# Patient Record
Sex: Female | Born: 1965
Health system: Southern US, Community
[De-identification: ages and names within clinical notes are randomized; demographics above are authoritative.]

## PROBLEM LIST (undated history)

## (undated) DIAGNOSIS — G43909 Migraine, unspecified, not intractable, without status migrainosus: Secondary | ICD-10-CM

## (undated) DIAGNOSIS — K219 Gastro-esophageal reflux disease without esophagitis: Secondary | ICD-10-CM

## (undated) DIAGNOSIS — E119 Type 2 diabetes mellitus without complications: Secondary | ICD-10-CM

## (undated) DIAGNOSIS — E7800 Pure hypercholesterolemia, unspecified: Secondary | ICD-10-CM

## (undated) DIAGNOSIS — I10 Essential (primary) hypertension: Secondary | ICD-10-CM

## (undated) HISTORY — DX: Type 2 diabetes mellitus without complications: E11.9

## (undated) HISTORY — DX: Gastro-esophageal reflux disease without esophagitis: K21.9

## (undated) HISTORY — DX: Essential (primary) hypertension: I10

## (undated) HISTORY — DX: Pure hypercholesterolemia, unspecified: E78.00

## (undated) HISTORY — PX: LASER ABLATION OF THE CERVIX: SHX1949

## (undated) HISTORY — DX: Migraine, unspecified, not intractable, without status migrainosus: G43.909

---

## 1997-08-30 ENCOUNTER — Other Ambulatory Visit: Admission: RE | Admit: 1997-08-30 | Discharge: 1997-08-30 | Payer: Self-pay | Admitting: *Deleted

## 1998-03-05 ENCOUNTER — Other Ambulatory Visit: Admission: RE | Admit: 1998-03-05 | Discharge: 1998-03-05 | Payer: Self-pay | Admitting: *Deleted

## 1998-03-06 ENCOUNTER — Other Ambulatory Visit: Admission: RE | Admit: 1998-03-06 | Discharge: 1998-03-06 | Payer: Self-pay | Admitting: *Deleted

## 2000-11-03 ENCOUNTER — Other Ambulatory Visit: Admission: RE | Admit: 2000-11-03 | Discharge: 2000-11-03 | Payer: Self-pay | Admitting: *Deleted

## 2006-04-26 HISTORY — PX: ABDOMINAL HYSTERECTOMY: SHX81

## 2006-04-26 HISTORY — PX: TOTAL ABDOMINAL HYSTERECTOMY: SHX209

## 2006-05-23 ENCOUNTER — Other Ambulatory Visit: Admission: RE | Admit: 2006-05-23 | Discharge: 2006-05-23 | Payer: Self-pay | Admitting: Gynecology

## 2006-09-12 ENCOUNTER — Encounter: Payer: Self-pay | Admitting: Gynecology

## 2006-09-12 ENCOUNTER — Inpatient Hospital Stay (HOSPITAL_COMMUNITY): Admission: RE | Admit: 2006-09-12 | Discharge: 2006-09-14 | Payer: Self-pay | Admitting: Gynecology

## 2007-10-13 ENCOUNTER — Other Ambulatory Visit: Admission: RE | Admit: 2007-10-13 | Discharge: 2007-10-13 | Payer: Self-pay | Admitting: Gynecology

## 2008-08-08 ENCOUNTER — Ambulatory Visit: Payer: Self-pay | Admitting: Family Medicine

## 2008-08-08 DIAGNOSIS — I10 Essential (primary) hypertension: Secondary | ICD-10-CM | POA: Insufficient documentation

## 2008-08-08 DIAGNOSIS — G43009 Migraine without aura, not intractable, without status migrainosus: Secondary | ICD-10-CM | POA: Insufficient documentation

## 2008-08-08 DIAGNOSIS — I999 Unspecified disorder of circulatory system: Secondary | ICD-10-CM | POA: Insufficient documentation

## 2008-09-18 ENCOUNTER — Encounter: Payer: Self-pay | Admitting: Family Medicine

## 2008-09-18 ENCOUNTER — Encounter (INDEPENDENT_AMBULATORY_CARE_PROVIDER_SITE_OTHER): Payer: Self-pay | Admitting: *Deleted

## 2008-09-18 LAB — CONVERTED CEMR LAB
ALT: 30 units/L
Albumin: 4.8 g/dL
BUN: 11 mg/dL
Creatinine, Ser: 0.9 mg/dL
Globulin: 2.4 g/dL
HDL: 44.3 mg/dL
LDL Cholesterol: 128.5 mg/dL

## 2008-10-03 ENCOUNTER — Ambulatory Visit: Payer: Self-pay | Admitting: Family Medicine

## 2008-10-03 DIAGNOSIS — J309 Allergic rhinitis, unspecified: Secondary | ICD-10-CM

## 2008-10-11 ENCOUNTER — Telehealth (INDEPENDENT_AMBULATORY_CARE_PROVIDER_SITE_OTHER): Payer: Self-pay | Admitting: *Deleted

## 2008-10-17 ENCOUNTER — Encounter: Payer: Self-pay | Admitting: Gynecology

## 2008-10-17 ENCOUNTER — Encounter: Payer: Self-pay | Admitting: Family Medicine

## 2008-10-17 ENCOUNTER — Other Ambulatory Visit: Admission: RE | Admit: 2008-10-17 | Discharge: 2008-10-17 | Payer: Self-pay | Admitting: Gynecology

## 2008-10-17 ENCOUNTER — Ambulatory Visit: Payer: Self-pay | Admitting: Gynecology

## 2008-10-17 ENCOUNTER — Encounter (INDEPENDENT_AMBULATORY_CARE_PROVIDER_SITE_OTHER): Payer: Self-pay | Admitting: *Deleted

## 2008-10-17 LAB — CONVERTED CEMR LAB
RBC count: 4.64 10*6/uL
WBC, blood: 7.7 10*3/uL

## 2008-11-18 ENCOUNTER — Encounter (INDEPENDENT_AMBULATORY_CARE_PROVIDER_SITE_OTHER): Payer: Self-pay | Admitting: *Deleted

## 2008-12-25 ENCOUNTER — Telehealth (INDEPENDENT_AMBULATORY_CARE_PROVIDER_SITE_OTHER): Payer: Self-pay | Admitting: *Deleted

## 2009-03-19 ENCOUNTER — Ambulatory Visit: Payer: Self-pay | Admitting: Family

## 2009-03-19 DIAGNOSIS — H6690 Otitis media, unspecified, unspecified ear: Secondary | ICD-10-CM | POA: Insufficient documentation

## 2009-03-19 DIAGNOSIS — R05 Cough: Secondary | ICD-10-CM | POA: Insufficient documentation

## 2009-03-19 LAB — CONVERTED CEMR LAB
Cholesterol, target level: 200 mg/dL
LDL Goal: 160 mg/dL

## 2009-04-21 ENCOUNTER — Telehealth (INDEPENDENT_AMBULATORY_CARE_PROVIDER_SITE_OTHER): Payer: Self-pay | Admitting: *Deleted

## 2009-05-01 ENCOUNTER — Ambulatory Visit: Payer: Self-pay | Admitting: Gynecology

## 2009-05-09 ENCOUNTER — Encounter (INDEPENDENT_AMBULATORY_CARE_PROVIDER_SITE_OTHER): Payer: Self-pay | Admitting: *Deleted

## 2009-05-09 ENCOUNTER — Ambulatory Visit: Payer: Self-pay | Admitting: Gynecology

## 2009-05-09 ENCOUNTER — Encounter: Payer: Self-pay | Admitting: Family Medicine

## 2009-05-09 LAB — CONVERTED CEMR LAB
Cholesterol: 212 mg/dL
Glucose, Bld: 247 mg/dL

## 2009-05-12 ENCOUNTER — Encounter (INDEPENDENT_AMBULATORY_CARE_PROVIDER_SITE_OTHER): Payer: Self-pay | Admitting: *Deleted

## 2009-05-16 ENCOUNTER — Ambulatory Visit: Payer: Self-pay | Admitting: Family Medicine

## 2009-05-16 DIAGNOSIS — E1142 Type 2 diabetes mellitus with diabetic polyneuropathy: Secondary | ICD-10-CM

## 2009-05-16 DIAGNOSIS — E785 Hyperlipidemia, unspecified: Secondary | ICD-10-CM

## 2009-05-16 LAB — CONVERTED CEMR LAB
Blood in Urine, dipstick: NEGATIVE
Nitrite: NEGATIVE
Protein, U semiquant: NEGATIVE
WBC Urine, dipstick: NEGATIVE
pH: 6

## 2009-05-22 ENCOUNTER — Telehealth: Payer: Self-pay | Admitting: Family Medicine

## 2009-05-22 ENCOUNTER — Ambulatory Visit: Payer: Self-pay | Admitting: Family Medicine

## 2009-05-27 ENCOUNTER — Telehealth (INDEPENDENT_AMBULATORY_CARE_PROVIDER_SITE_OTHER): Payer: Self-pay | Admitting: *Deleted

## 2009-05-27 LAB — CONVERTED CEMR LAB
Creatinine,U: 108.9 mg/dL
Microalb Creat Ratio: 7.3 mg/g (ref 0.0–30.0)

## 2009-06-05 ENCOUNTER — Telehealth (INDEPENDENT_AMBULATORY_CARE_PROVIDER_SITE_OTHER): Payer: Self-pay | Admitting: *Deleted

## 2009-06-11 ENCOUNTER — Encounter (INDEPENDENT_AMBULATORY_CARE_PROVIDER_SITE_OTHER): Payer: Self-pay | Admitting: *Deleted

## 2009-06-12 ENCOUNTER — Telehealth (INDEPENDENT_AMBULATORY_CARE_PROVIDER_SITE_OTHER): Payer: Self-pay | Admitting: *Deleted

## 2009-08-11 ENCOUNTER — Telehealth (INDEPENDENT_AMBULATORY_CARE_PROVIDER_SITE_OTHER): Payer: Self-pay | Admitting: *Deleted

## 2009-09-08 ENCOUNTER — Telehealth (INDEPENDENT_AMBULATORY_CARE_PROVIDER_SITE_OTHER): Payer: Self-pay | Admitting: *Deleted

## 2009-10-09 ENCOUNTER — Telehealth (INDEPENDENT_AMBULATORY_CARE_PROVIDER_SITE_OTHER): Payer: Self-pay | Admitting: *Deleted

## 2009-10-16 ENCOUNTER — Ambulatory Visit: Payer: Self-pay | Admitting: Family Medicine

## 2009-10-22 ENCOUNTER — Other Ambulatory Visit: Admission: RE | Admit: 2009-10-22 | Discharge: 2009-10-22 | Payer: Self-pay | Admitting: Gynecology

## 2009-10-22 ENCOUNTER — Ambulatory Visit: Payer: Self-pay | Admitting: Gynecology

## 2009-12-03 ENCOUNTER — Ambulatory Visit: Payer: Self-pay | Admitting: Family Medicine

## 2009-12-10 LAB — CONVERTED CEMR LAB
ALT: 17 units/L (ref 0–35)
Bilirubin, Direct: 0.2 mg/dL (ref 0.0–0.3)
Chloride: 99 meq/L (ref 96–112)
Creatinine, Ser: 0.7 mg/dL (ref 0.4–1.2)
Creatinine,U: 145.2 mg/dL
LDL Cholesterol: 75 mg/dL (ref 0–99)
Potassium: 4.5 meq/L (ref 3.5–5.1)
Total Bilirubin: 0.5 mg/dL (ref 0.3–1.2)
Total CHOL/HDL Ratio: 4
Triglycerides: 158 mg/dL — ABNORMAL HIGH (ref 0.0–149.0)
VLDL: 31.6 mg/dL (ref 0.0–40.0)

## 2010-01-22 ENCOUNTER — Telehealth (INDEPENDENT_AMBULATORY_CARE_PROVIDER_SITE_OTHER): Payer: Self-pay | Admitting: *Deleted

## 2010-01-28 ENCOUNTER — Telehealth: Payer: Self-pay | Admitting: Family Medicine

## 2010-02-02 ENCOUNTER — Encounter (INDEPENDENT_AMBULATORY_CARE_PROVIDER_SITE_OTHER): Payer: Self-pay | Admitting: *Deleted

## 2010-02-25 ENCOUNTER — Encounter: Payer: Self-pay | Admitting: Internal Medicine

## 2010-02-25 ENCOUNTER — Ambulatory Visit: Payer: Self-pay | Admitting: Family Medicine

## 2010-02-25 DIAGNOSIS — E049 Nontoxic goiter, unspecified: Secondary | ICD-10-CM

## 2010-02-25 LAB — CONVERTED CEMR LAB
Albumin: 4.5 g/dL (ref 3.5–5.2)
BUN: 12 mg/dL (ref 6–23)
CO2: 28 meq/L (ref 19–32)
Chloride: 101 meq/L (ref 96–112)
GFR calc non Af Amer: 102.58 mL/min (ref >60)
Glucose, Bld: 96 mg/dL (ref 70–99)
HDL: 36.2 mg/dL — ABNORMAL LOW (ref >39.00)
LDL Cholesterol: 86 mg/dL (ref 0–99)
Potassium: 4 meq/L (ref 3.5–5.1)
Total Bilirubin: 1.3 mg/dL — ABNORMAL HIGH (ref 0.3–1.2)
Total CHOL/HDL Ratio: 4
VLDL: 24.6 mg/dL (ref 0.0–40.0)

## 2010-04-13 ENCOUNTER — Telehealth (INDEPENDENT_AMBULATORY_CARE_PROVIDER_SITE_OTHER): Payer: Self-pay | Admitting: *Deleted

## 2010-04-26 HISTORY — PX: COLONOSCOPY: SHX174

## 2010-05-16 ENCOUNTER — Encounter: Payer: Self-pay | Admitting: Family Medicine

## 2010-05-27 ENCOUNTER — Telehealth (INDEPENDENT_AMBULATORY_CARE_PROVIDER_SITE_OTHER): Payer: Self-pay | Admitting: *Deleted

## 2010-05-28 NOTE — Letter (Signed)
Summary: Unable To Reach-Consult Scheduled  [facility] [place]  [address] [place], [place] [address]   Phone: [phone]  Fax: [phone]    06/11/2009 MRN: [id number]    Dear Ms. Signor,   We have been unable to reach you by phone.  Please contact our office with an updated phone number.     Thank you,  Army Fossa CMA  June 11, 2009 1:51 PM

## 2010-05-28 NOTE — Assessment & Plan Note (Signed)
Summary: FOR FOLLO-UP--PH   Vital Signs:  Patient profile:   45 year old female Weight:      162.76 pounds BMI:     27.18 Pulse rate:   82 / minute Pulse rhythm:   regular BP sitting:   122 / 76  (left arm) Cuff size:   large  Vitals Entered By: Army Fossa CMA (October 16, 2009 11:03 AM) CC: Pt here for follow up, would like refill on cheratussion   History of Present Illness:  Type 1 diabetes mellitus follow-up      This is a 45 year old woman who presents with Type 2 diabetes mellitus follow-up.  The patient denies polyuria, polydipsia, blurred vision, self managed hypoglycemia, hypoglycemia requiring help, weight loss, weight gain, and numbness of extremities.  The patient denies the following symptoms: neuropathic pain, chest pain, vomiting, orthostatic symptoms, poor wound healing, intermittent claudication, vision loss, and foot ulcer.  Since the last visit the patient reports good dietary compliance, compliance with medications, exercising regularly, and monitoring blood glucose.  The patient has been measuring capillary blood glucose before breakfast and after dinner.  Since the last visit, the patient reports having had eye care by an ophthalmologist and no foot care.    Hyperlipidemia follow-up      The patient also presents for Hyperlipidemia follow-up.  The patient denies muscle aches, GI upset, abdominal pain, flushing, itching, constipation, diarrhea, and fatigue.  The patient denies the following symptoms: chest pain/pressure, exercise intolerance, dypsnea, palpitations, syncope, and pedal edema.  Compliance with medications (by patient report) has been near 100%.  Dietary compliance has been good.  The patient reports exercising 3-4X per week.  Adjunctive measures currently used by the patient include weight reduction.    Hypertension follow-up      The patient also presents for Hypertension follow-up.  The patient denies lightheadedness, urinary frequency, headaches,  edema, impotence, rash, and fatigue.  The patient denies the following associated symptoms: chest pain, chest pressure, exercise intolerance, dyspnea, palpitations, syncope, leg edema, and pedal edema.  Compliance with medications (by patient report) has been near 100%.  The patient reports that dietary compliance has been good.  The patient reports exercising 3-4X per week.  Adjunctive measures currently used by the patient include salt restriction.    Current Medications (verified): 1)  Lisinopril-Hydrochlorothiazide 20-12.5 Mg Tabs (Lisinopril-Hydrochlorothiazide) .... Take One Tablet Two Times A Day 2)  Amlodipine Besylate 5 Mg  Tabs (Amlodipine Besylate) .Marland Kitchen.. 1 By Mouth Every Day 3)  Nasacort Aq 55 Mcg/act Aers (Triamcinolone Acetonide(Nasal)) .... Two Sprays Each Nostril Once Daily 4)  Cheratussin Ac 100-10 Mg/68ml Syrp (Guaifenesin-Codeine) .Marland Kitchen.. 1-2 Tsps Q4-6 As Needed For Cough.  Will Cause Drowsiness.  Disp 5)  Lipitor 10 Mg Tabs (Atorvastatin Calcium) .Marland Kitchen.. 1 By Mouth At Bedtime 6)  Glucophage Xr 500 Mg Xr24h-Tab (Metformin Hcl) .... 2 By Mouth At Bedtime 7)  One Touch Delica Lancets  Misc (Lancets) .... Test Sugars Two Times A Day 8)  One Touch Ultra Test Strips .... Test Two Times A Day  Allergies (verified): No Known Drug Allergies  Past History:  Past medical, surgical, family and social histories (including risk factors) reviewed for relevance to current acute and chronic problems.  Past Medical History: Reviewed history from 08/08/2008 and no changes required. Hypertension Migraines  Past Surgical History: Reviewed history from 08/08/2008 and no changes required. Hysterectomy-ovaries remain  Family History: Reviewed history from 08/08/2008 and no changes required. CAD-no HTN-father, grandparents DM-grandfather STROKE-sister 20's due  to OCPs COLON CA-maternal aunt BREAST CA-no sister w/ MS  Social History: Reviewed history from 08/08/2008 and no changes  required. 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 Lungs:  Normal respiratory effort, chest expands symmetrically. Lungs are clear to auscultation, no crackles or wheezes. Heart:  normal rate and no murmur.   Extremities:  No clubbing, cyanosis, edema, or deformity noted with normal full range of motion of all joints.    Diabetes Management Exam:    Foot Exam (with socks and/or shoes not present):       Sensory-Pinprick/Light touch:          Left medial foot (L-4): normal          Left dorsal foot (L-5): normal          Left lateral foot (S-1): normal          Right medial foot (L-4): normal          Right dorsal foot (L-5): normal          Right lateral foot (S-1): normal       Sensory-Monofilament:          Left foot: normal          Right foot: normal       Inspection:          Left foot: normal          Right foot: normal       Nails:          Left foot: normal          Right foot: normal    Eye Exam:       Eye Exam done elsewhere          Date: 10/11/2007          Results: normal          Done by: SEARS                                                                                              Impression & Recommendations:  Problem # 1:  HYPERLIPIDEMIA (ICD-272.4)  Her updated medication list for this problem includes:    Lipitor 10 Mg Tabs (Atorvastatin calcium) .Marland Kitchen... 1 by mouth at bedtime  Labs Reviewed: SGOT: 24 (09/18/2008)   SGPT: 30 (09/18/2008)  Lipid Goals: Chol Goal: 200 (03/19/2009)   HDL Goal: 40 (03/19/2009)   LDL Goal: 160 (03/19/2009)   TG Goal: 150 (03/19/2009)  Prior 10 Yr Risk Heart Disease: 3 % (03/19/2009)   HDL:57 (05/09/2009), 44.30 (09/18/2008)  LDL:122 (05/09/2009), 128.50 (09/18/2008)  Chol:212 (05/09/2009), 228  (10/17/2008)  Trig:262 (05/09/2009), 211 (09/18/2008)  Problem # 2:  DIABETES-TYPE 2 (ICD-250.00)  Her updated medication list for this problem includes:    Lisinopril-hydrochlorothiazide 20-12.5 Mg Tabs (Lisinopril-hydrochlorothiazide) .Marland Kitchen... Take one tablet two times a day    Glucophage Xr 500 Mg Xr24h-tab (Metformin hcl) .Marland KitchenMarland KitchenMarland KitchenMarland Kitchen 2  by mouth at bedtime  Labs Reviewed: Creat: .90 (09/18/2008)     Last Eye Exam: normal (10/11/2007) Reviewed HgBA1c results: 11.2 (05/22/2009)  Problem # 3:  COUGH, CHRONIC (ICD-786.2)  Problem # 4:  HYPERTENSION (ICD-401.9)  Her updated medication list for this problem includes:    Lisinopril-hydrochlorothiazide 20-12.5 Mg Tabs (Lisinopril-hydrochlorothiazide) .Marland Kitchen... Take one tablet two times a day    Amlodipine Besylate 5 Mg Tabs (Amlodipine besylate) .Marland Kitchen... 1 by mouth every day  BP today: 122/76 Prior BP: 118/78 (05/16/2009)  Prior 10 Yr Risk Heart Disease: 3 % (03/19/2009)  Labs Reviewed: Creat: : .90 (09/18/2008)   Chol: 212 (05/09/2009)   HDL: 57 (05/09/2009)   LDL: 122 (05/09/2009)   TG: 262 (05/09/2009)  Problem # 5:  COMMON MIGRAINE (ICD-346.10)  Complete Medication List: 1)  Lisinopril-hydrochlorothiazide 20-12.5 Mg Tabs (Lisinopril-hydrochlorothiazide) .... Take one tablet two times a day 2)  Amlodipine Besylate 5 Mg Tabs (Amlodipine besylate) .Marland Kitchen.. 1 by mouth every day 3)  Nasacort Aq 55 Mcg/act Aers (Triamcinolone acetonide(nasal)) .... Two sprays each nostril once daily 4)  Cheratussin Ac 100-10 Mg/42ml Syrp (Guaifenesin-codeine) .Marland Kitchen.. 1-2 tsps q4-6 as needed for cough.  will cause drowsiness.  disp 5)  Lipitor 10 Mg Tabs (Atorvastatin calcium) .Marland Kitchen.. 1 by mouth at bedtime 6)  Glucophage Xr 500 Mg Xr24h-tab (Metformin hcl) .... 2 by mouth at bedtime 7)  One Touch Delica Lancets Misc (Lancets) .... Test sugars two times a day 8)  One Touch Ultra Test Strips  .... Test two times a day  Patient Instructions: 1)  fasting labs 250.00  272.4  401.9  bmp, hep, lipid, hgba1c, microalbumin  2)  call us with the name and number of a nutritionist closer to home for you Prescriptions: LIPITOR 10 MG TABS (ATORVASTATIN CALCIUM) 1 by mouth at bedtime  #30 x 2   Entered and Authorized by:   Loreen Freud DO   Signed by:   Loreen Freud DO on 10/16/2009   Method used:   Print then Give to Patient   RxID:   [id number] AMLODIPINE BESYLATE 5 MG  TABS (AMLODIPINE BESYLATE) 1 by mouth every day  #30 x 5   Entered and Authorized by:   Loreen Freud DO   Signed by:   Loreen Freud DO on 10/16/2009   Method used:   Faxed to ...       [facility]* (retail)       [address]       [place], [place]  [address]       Ph: [phone]       Fax: [phone]   RxID:   [id number] LISINOPRIL-HYDROCHLOROTHIAZIDE 20-12.5 MG TABS (LISINOPRIL-HYDROCHLOROTHIAZIDE) take one tablet two times a day  #180 x 3   Entered and Authorized by:   Loreen Freud DO   Signed by:   Loreen Freud DO on 10/16/2009   Method used:   Faxed to ...       [facility]* (retail)       [address]       [place], [place]  [address]       Ph: [phone]       Fax: [phone]   RxID:   [id number] GLUCOPHAGE XR 500 MG XR24H-TAB (METFORMIN HCL) 2 by mouth at bedtime  #60 x 2   Entered and Authorized by:   Loreen Freud DO   Signed by:   Loreen Freud DO on 10/16/2009   Method used:   Faxed to .Marland KitchenMarland Kitchen  [facility]* (retail)       [address]       [place], [place]  [address]       Ph: [phone]       Fax: [phone]   RxID:   [id number] CHERATUSSIN AC 100-10 MG/5ML SYRP (GUAIFENESIN-CODEINE) 1-2 tsps Q4-6 as needed for cough.  will cause drowsiness.  disp  #150 x 0   Entered and Authorized by:   Loreen Freud DO   Signed by:   Loreen Freud DO on 10/16/2009   Method used:   Print then Give to Patient   RxID:   [id number]

## 2010-05-28 NOTE — Progress Notes (Signed)
Summary: lisinopril/hctz refill   Phone Note Refill Request Call back at [phone] Message from:  Pharmacy on Sep 08, 2009 8:40 AM  Refills Requested: Medication #1:  LISINOPRIL-HYDROCHLOROTHIAZIDE 20-12.5 MG TABS take one tablet two times a day   Dosage confirmed as above?Dosage Confirmed   Supply Requested: 1 month   Last Refilled: 08/11/2009 [facility] [address] in [place], [place]  Next Appointment Scheduled: none Initial call taken by: Harold Barban,  Sep 08, 2009 8:40 AM    Prescriptions: LISINOPRIL-HYDROCHLOROTHIAZIDE 20-12.5 MG TABS (LISINOPRIL-HYDROCHLOROTHIAZIDE) take one tablet two times a day  #60 x 0   Entered by:   Doristine Devoid   Authorized by:   Neena Rhymes MD   Signed by:   Doristine Devoid on 09/08/2009   Method used:   Electronically to        [facility]* (retail)       [address]       [place], [place]  [address]       Ph: [phone]       Fax: [phone]   RxID:   [id number]

## 2010-05-28 NOTE — Progress Notes (Signed)
Summary: Glucose Log Brought by Patient  Glucose Log Brought by Patient   Imported By: Lanelle Bal 10/23/2009 12:04:24  _____________________________________________________________________  External Attachment:    Type:   Image     Comment:   External Document

## 2010-05-28 NOTE — Letter (Signed)
Summary: Primary Care Appointment Letter  [facility] [place]  [address] [place], [place] [address]   Phone: [phone]  Fax: [phone]    02/02/2010 MRN: [id number]  [facility] [address] [place], [place]  [address]  Dear Ms. Satre,   Your Primary Care Physician Neena Rhymes MD has indicated that:    ____X___it is time to schedule an appointment DM needs visits every 3 months, cholesterol and BP every 6 months appt needs to be scheduled before any further refills are dispenced.    _______you missed your appointment on______ and need to call and          reschedule.    _______you need to have lab work done.    _______you need to schedule an appointment discuss lab or test results.    _______you need to call to reschedule your appointment that is                       scheduled on _________.     Please call our office as soon as possible. Our phone number is [phone]____.Our office is open 8a-5p, Monday through Friday.     Thank you,    [facility] Scheduler

## 2010-05-28 NOTE — Progress Notes (Signed)
Summary: DIETICIAN APPT.  Phone Note Call from Patient Call back at [facility] Phone [phone]   Caller: Patient Summary of Call: PATIENT CAME IN FOR LABS TODAY AND LEFT THIS NOTE FOR DR. Lavone Neri POSSIBLE CAN YOU CONTACT A DIETICIAN IN [place] OR [place] [facility]. SO I CAN GET AN EARLIER APPOINTMENT. I RECEIVED A CALL FOR AN APPT HERE IN [place],BUT IT WAS NOT UNTIL MARCH. Initial call taken by: Freddy Jaksch,  May 22, 2009 4:22 PM  Follow-up for Phone Call        I have located & faxed patient info to [facility], [facility] (Not in-network w/[employer], but still may pay well due to diabetes)  [address] [place], [place] [address], [phone] They advise pt to check benefits w/her insurance co.  I COMPLETED REFERRAL FORM & FAXED TO THEM, THEY WILL CONTACT PT, I LM FOR PT TO CALL ME ABOUT THIS Magdalen Spatz [facility]  June 13, 2009 4:55 PM

## 2010-05-28 NOTE — Progress Notes (Signed)
Summary: Refill Request  Phone Note Refill Request Message from:  Pharmacy on [facility] Fax #: [phone]  Refills Requested: Medication #1:  ONE TOUCH DELICA LANCETS  MISC Test sugars two times a day   Dosage confirmed as above?Dosage Confirmed Initial call taken by: Harold Barban,  June 12, 2009 2:04 PM    Prescriptions: ONE TOUCH DELICA LANCETS  MISC (LANCETS) Test sugars two times a day  #60 x 11   Entered by:   Army Fossa CMA   Authorized by:   Loreen Freud DO   Signed by:   Army Fossa CMA on 06/12/2009   Method used:   Faxed to ...       [facility]* (retail)       [address]       [place], [place]  [address]       Ph: [phone]       Fax: [phone]   RxID:   [id number]

## 2010-05-28 NOTE — Progress Notes (Signed)
Summary: Refill--Glucophage  Phone Note Refill Request Message from:  Fax from Pharmacy on January 28, 2010 8:45 AM  Refills Requested: Medication #1:  GLUCOPHAGE XR 500 MG XR24H-TAB 2 by mouth at bedtime [facility] - fax [phone]  Initial call taken by: Okey Regal [place],  January 28, 2010 8:45 AM    Prescriptions: GLUCOPHAGE XR 500 MG XR24H-TAB (METFORMIN HCL) 2 by mouth at bedtime  #60 x 1   Entered by:   Lucious Groves CMA   Authorized by:   Neena Rhymes MD   Signed by:   Lucious Groves CMA on 01/28/2010   Method used:   Electronically to        [facility]* (retail)       [address]       [place], [place]  [address]       Ph: [phone]       Fax: [phone]   RxID:   [id number]

## 2010-05-28 NOTE — Miscellaneous (Signed)
  Clinical Lists Changes  Observations: Added new observation of TRIGLYC TOT: 262 mg/dL (60/45/4098 1:19) Added new observation of LDL: 122 mg/dL (14/78/2956 2:13) Added new observation of HDL: 57 mg/dL (08/65/7846 9:62) Added new observation of CHOLESTEROL: 212 mg/dL (95/28/4132 4:40) Added new observation of GLUCOSE SER: 247 mg/dL (02/20/2535 6:44)

## 2010-05-28 NOTE — Progress Notes (Signed)
Summary: NUTRITION REFERRAL  Phone Note Outgoing Call   Call placed by: Magdalen Spatz [facility],  January 22, 2010 1:24 PM Call placed to: Specialist Summary of Call: IN REFERENCE TO NUTRITION REFERRAL TO:  Burnard Hawthorne of [facility], I LEFT VOICEMAIL FOR [facility] TO RETURN, I CALLED [facility] TO CHECK STATUS OF REFERRAL, HAD TO LEAVE MESSAGE FOR A CB Magdalen Spatz [facility]  January 21, 2010 3:20 PM  PER CALL BACK FROM [facility], SHE HAS LEFT MESSAGES FOR PT & EVEN MAILED LETTER, PT WILL NOT RESPOND.  THIS IS THE 2ND NUTRITION REFERRAL SENT TO [facility] UPON PATIENT REQUEST, & SHE NEVER RESPONDS TO ATTEMPTS TO CONTACT HER PER Ruth[facility].   Initial call taken by: Magdalen Spatz [facility]),  January 22, 2010 1:24 PM  Follow-up for Phone Call        noted.  i have not seen pt since last summer.  please call her and let her know that she is overdue for an appt Follow-up by: Neena Rhymes MD,  January 22, 2010 1:33 PM  Additional Follow-up for Phone Call Additional follow up Details #1::        I called & spoke with patient, she states she doesn't need an office visit, that she was just here in August for labs & that she gets her physical's with another physician.  I informed her that Dr. Beverely Low wants to see her, but again she stated she only needs to come in to have her blood sugar checked & that she will call back to sch'd after she checks her work schedule. Additional Follow-up by: Magdalen Spatz [facility],  January 30, 2010 11:38 AM    Additional Follow-up for Phone Call Additional follow up Details #2::    pt has HTN, diabetes, and high cholesterol- these things need to be monitored regularly.  DM needs visits every 3 months, cholesterol and BP every 6.  if she doesn't want to come in for visits then I am very sorry but we will no longer be refilling her medications.  we can't give meds w/out knowing what her lab values are and how she is doing- it's not medically  appropriate.  pt has 30 days to schedule an appt and after that time- NO REFILLS Follow-up by: Neena Rhymes MD,  January 30, 2010 1:34 PM  Additional Follow-up for Phone Call Additional follow up Details #3:: Details for Additional Follow-up Action Taken: letter mailed to patient .........Marland KitchenDoristine Devoid CMA  February 02, 2010 4:59 PM

## 2010-05-28 NOTE — Letter (Signed)
Summary: Pre Visit Letter Revised  [facility]  [address] [place], [place] [address]   Phone: [phone]  Fax: [phone]        02/25/2010 MRN: [id number]  [facility] [address] [place], [place]  [address]             Procedure Date: 12-15 at 8am   Welcome to the Gastroenterology Division at [facility].    You are scheduled to see a nurse for your pre-procedure visit on 03-27-10 at 10am on the 3rd floor at [facility], [address]  We ask that you try to arrive at our office 15 minutes prior to your appointment time to allow for check-in.  Please take a minute to review the attached form.  If you answer "Yes" to one or more of the questions on the first page, we ask that you call the person listed at your earliest opportunity.  If you answer "No" to all of the questions, please complete the rest of the form and bring it to your appointment.    Your nurse visit will consist of discussing your medical and surgical history, your immediate family medical history, and your medications.   If you are unable to list all of your medications on the form, please bring the medication bottles to your appointment and we will list them.  We will need to be aware of both prescribed and over the counter drugs.  We will need to know exact dosage information as well.    Please be prepared to read and sign documents such as consent forms, a financial agreement, and acknowledgement forms.  If necessary, and with your consent, a friend or relative is welcome to sit-in on the nurse visit with you.  Please bring your insurance card so that we may make a copy of it.  If your insurance requires a referral to see a specialist, please bring your referral form from your primary care physician.  No co-pay is required for this nurse visit.     If you cannot keep your appointment, please call [phone] to cancel or reschedule prior to your appointment date.  This  allows [place] the opportunity to schedule an appointment for another patient in need of care.    Thank you for choosing [facility] for your medical needs.  We appreciate the opportunity to care for you.  Please visit [place] at our website  to learn more about our practice.  Sincerely, The Gastroenterology Division

## 2010-05-28 NOTE — Progress Notes (Signed)
Summary: REFILL  Phone Note Refill Request Call back at [phone] Message from:  Pharmacy on April 13, 2010 8:37 AM  Refills Requested: Medication #1:  GLUCOPHAGE XR 500 MG XR24H-TAB 2 by mouth at bedtime   Dosage confirmed as above?Dosage Confirmed   Supply Requested: 1 month   Last Refilled: 03/04/2010 [facility] [address] [place]     Prescriptions: GLUCOPHAGE XR 500 MG XR24H-TAB (METFORMIN HCL) 2 by mouth at bedtime  #60 x 6   Entered by:   Doristine Devoid CMA   Authorized by:   Neena Rhymes MD   Signed by:   Doristine Devoid CMA on 04/13/2010   Method used:   Electronically to        [facility] (retail)       [address]       [place], [place]  [address]       Ph: [phone]       Fax: [phone]   RxID:   [id number]

## 2010-05-28 NOTE — Assessment & Plan Note (Signed)
Summary: BLOOD SUGAR AND CHOLESTEROL HIGH PER DR FERNANDEZ OFFICE --GB.Marland KitchenMarland Kitchen   Vital Signs:  Patient profile:   45 year old female Weight:      178 pounds Temp:     98.1 degrees F oral Pulse rate:   86 / minute BP sitting:   118 / 78  (left arm)  Vitals Entered By: Jeremy Johann CMA (May 16, 2009 4:16 PM) mmCC: glucose and cholesterol elevated Comments REVIEWED MED LIST, PATIENT AGREED DOSE AND INSTRUCTION CORRECT    History of Present Illness: Pt here because lipids and glucose are elevated.   Pt with no complaints.     Current Medications (verified): 1)  Lisinopril-Hydrochlorothiazide 20-12.5 Mg Tabs (Lisinopril-Hydrochlorothiazide) .... Take One Tablet Two Times A Day 2)  Amlodipine Besylate 5 Mg  Tabs (Amlodipine Besylate) .Marland Kitchen.. 1 By Mouth Every Day 3)  Nasacort Aq 55 Mcg/act Aers (Triamcinolone Acetonide(Nasal)) .... Two Sprays Each Nostril Once Daily 4)  Cheratussin Ac 100-10 Mg/67ml Syrp (Guaifenesin-Codeine) .Marland Kitchen.. 1-2 Tsps Q4-6 As Needed For Cough.  Will Cause Drowsiness.  Disp 5)  Zocor 40 Mg Tabs (Simvastatin) .Marland Kitchen.. 1 By Mouth At Bedtime 6)  Prilosec Otc 20 Mg Tbec (Omeprazole Magnesium) .... One Tablet By Mouth Daily 7)  Glucophage Xr 500 Mg Xr24h-Tab (Metformin Hcl) .Marland Kitchen.. 1 By Mouth Once Daily 8)  One Touch Delica Lancets  Misc (Lancets) .... Test Sugars Two Times A Day 9)  One Touch Ultra Test Strips .... Test Two Times A Day  Allergies (verified): No Known Drug Allergies  Past History:  Past medical, surgical, family and social histories (including risk factors) reviewed for relevance to current acute and chronic problems.  Past Medical History: Reviewed history from 08/08/2008 and no changes required. Hypertension Migraines  Past Surgical History: Reviewed history from 08/08/2008 and no changes required. Hysterectomy-ovaries remain  Family History: Reviewed history from 08/08/2008 and no changes required. CAD-no HTN-father,  grandparents DM-grandfather STROKE-sister 20's due to OCPs COLON CA-maternal aunt BREAST CA-no sister w/ MS  Social History: Reviewed history from 08/08/2008 and no changes required. 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 Alert and cooperative with normal attention span and concentration. No apparent delusions, illusions, hallucinations   Impression & Recommendations:  Problem # 1:  DIABETES-TYPE 2 (ICD-250.00) Pt given diets and DM ho to read  ACCU CHECK two times a day CALL WITH ANY QUESTIONS  Her updated medication list for this problem includes:    Lisinopril-hydrochlorothiazide 20-12.5 Mg Tabs (Lisinopril-hydrochlorothiazide) .Marland Kitchen... Take one tablet two times a day    Glucophage Xr 500 Mg Xr24h-tab (Metformin hcl) .Marland Kitchen... 1 by mouth once daily  Labs Reviewed: Creat: .90 (09/18/2008)     Orders: Nutrition Referral (Nutrition)  Problem # 2:  HYPERLIPIDEMIA (ICD-272.4)  Her updated medication list for this problem includes:    Zocor 40 Mg Tabs (Simvastatin) .Marland Kitchen... 1 by mouth at bedtime  Labs Reviewed: SGOT: 24 (09/18/2008)   SGPT: 30 (09/18/2008)  Lipid Goals: Chol Goal: 200 (03/19/2009)   HDL Goal: 40 (03/19/2009)   LDL Goal: 160 (03/19/2009)   TG Goal: 150 (03/19/2009)  Prior 10 Yr Risk Heart Disease: 3 % (03/19/2009)   HDL:57 (05/09/2009), 44.30 (09/18/2008)  LDL:122 (05/09/2009), 128.50 (09/18/2008)  Chol:212 (05/09/2009), 228 (10/17/2008)  Trig:262 (05/09/2009), 211 (09/18/2008)  Orders: Nutrition Referral (Nutrition)  Complete  Medication List: 1)  Lisinopril-hydrochlorothiazide 20-12.5 Mg Tabs (Lisinopril-hydrochlorothiazide) .... Take one tablet two times a day 2)  Amlodipine Besylate 5 Mg Tabs (Amlodipine besylate) .Marland Kitchen.. 1 by mouth every  day 3)  Nasacort Aq 55 Mcg/act Aers (Triamcinolone acetonide(nasal)) .... Two sprays each nostril once daily 4)  Cheratussin Ac 100-10 Mg/56ml Syrp (Guaifenesin-codeine) .Marland Kitchen.. 1-2 tsps q4-6 as needed for cough.  will cause drowsiness.  disp 5)  Zocor 40 Mg Tabs (Simvastatin) .Marland Kitchen.. 1 by mouth at bedtime 6)  Prilosec Otc 20 Mg Tbec (Omeprazole magnesium) .... One tablet by mouth daily 7)  Glucophage Xr 500 Mg Xr24h-tab (Metformin hcl) .Marland Kitchen.. 1 by mouth once daily 8)  One Touch Delica Lancets Misc (Lancets) .... Test sugars two times a day 9)  One Touch Ultra Test Strips  .... Test two times a day  Patient Instructions: 1)  250.00  HGBA1C , MICROALBUMIN 2)  DROP OFF BS 2 WEEKS  Prescriptions: ONE TOUCH ULTRA TEST STRIPS test two times a day  #60 x 11   Entered by:   Army Fossa CMA   Authorized by:   Loreen Freud DO   Signed by:   Army Fossa CMA on 05/16/2009   Method used:   Faxed to ...       [facility]* (retail)       [address]       [place], [place]  [address]       Ph: [phone]       Fax: [phone]   RxID:   [id number] ONE TOUCH DELICA LANCETS  MISC (LANCETS) Test sugars two times a day  #60 x 11   Entered by:   Army Fossa CMA   Authorized by:   Loreen Freud DO   Signed by:   Army Fossa CMA on 05/16/2009   Method used:   Electronically to        [facility]* (retail)       [address]       [place], [place]  [address]       Ph: [phone]       Fax: [phone]   RxID:   [id number] ZOCOR 40 MG TABS (SIMVASTATIN) 1 by mouth at bedtime  #30 x 2   Entered and Authorized by:   Loreen Freud DO   Signed by:   Loreen Freud DO on 05/16/2009   Method used:   Electronically to        [facility]* (retail)       [address]       [place], [place]  [address]       Ph: [phone]       Fax: [phone]   RxID:   [id number] GLUCOPHAGE XR 500 MG XR24H-TAB (METFORMIN HCL) 1 by mouth  once daily  #30 x 2   Entered and Authorized by:   Loreen Freud DO   Signed by:   Loreen Freud DO on 05/16/2009   Method used:   Electronically to        [facility]* (retail)       [address]       [place], [place]  [address]       Ph: [phone]       Fax: [phone]   RxID:   [id number]   Laboratory Results   Urine Tests    Routine Urinalysis   Color: yellow Appearance: Clear Glucose: 500   (Normal Range: Negative) Bilirubin: negative   (Normal Range: Negative)  Ketone: negative   (Normal Range: Negative) Spec. Gravity: 1.015   (Normal Range: 1.003-1.035) Blood: negative   (Normal Range: Negative) pH: 6.0   (Normal Range: 5.0-8.0) Protein: negative   (Normal Range: Negative) Urobilinogen: 0.2   (Normal Range: 0-1) Nitrite: negative   (Normal Range: Negative) Leukocyte Esterace: negative   (Normal Range: Negative)    Comments: Army Fossa CMA  May 16, 2009 4:46 PM

## 2010-05-28 NOTE — Assessment & Plan Note (Signed)
Summary: followup/kn   Vital Signs:  Patient profile:   45 year old female Height:      65 inches Weight:      163 pounds BMI:     27.22 Pulse rate:   80 / minute BP sitting:   120 / 74  (left arm)  Vitals Entered By: Doristine Devoid CMA (February 25, 2010 9:49 AM) CC: f/u on meds and labs   History of Present Illness: 45 yo woman here today for  1) HTN- excellent today.  taking the Lisinopril HCT two times a day.  no CP, SOB, visual changes, edema.    2) Hyperlipidemia- tolerating Lipitor w/out difficulty.  no N/V, abd pain, myalgias.  3) DM- due for eye exam, plans on seeing sister's ophtho.  attempting to find nutritionist close to home.  CBGs ranging 90-140s.  not exercising regularly.  denies symptomatic lows, neuropathy.  4) Family hx of colon cancer- needs referral.  Current Medications (verified): 1)  Lisinopril-Hydrochlorothiazide 20-12.5 Mg Tabs (Lisinopril-Hydrochlorothiazide) .... Take One Tablet Two Times A Day 2)  Nasacort Aq 55 Mcg/act Aers (Triamcinolone Acetonide(Nasal)) .... Two Sprays Each Nostril Once Daily 3)  Lipitor 10 Mg Tabs (Atorvastatin Calcium) .Marland Kitchen.. 1 By Mouth At Bedtime 4)  Glucophage Xr 500 Mg Xr24h-Tab (Metformin Hcl) .... 2 By Mouth At Bedtime 5)  One Touch Delica Lancets  Misc (Lancets) .... Test Sugars Two Times A Day 6)  Onetouch Ultra Blue  Strp (Glucose Blood) .... Test Two Times A Day  Allergies (verified): No Known Drug Allergies  Past History:  Past medical, surgical, family and social histories (including risk factors) reviewed, and no changes noted (except as noted below).  Past Medical History: Hypertension Migraines DM Hyperlipidemia  Past Surgical History: Reviewed history from 08/08/2008 and no changes required. Hysterectomy-ovaries remain  Family History: Reviewed history from 08/08/2008 and no changes required. CAD-no HTN-father, grandparents DM-grandfather STROKE-sister 20's due to OCPs COLON CA-maternal  aunt BREAST CA-no sister w/ MS  Social History: Reviewed history from 08/08/2008 and no changes required. 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 No apparent alopecia or balding.  Eyes:  PERRL, EOMI Neck:  generalized thyromegaly, no nodularity Lungs:  Normal respiratory effort, chest expands symmetrically. Lungs are clear to auscultation, no crackles or wheezes. Heart:  normal rate and no murmur.   Abdomen:  soft, NT/ND, +BS Pulses:  +2 carotid, radial, DP Extremities:  No clubbing, cyanosis, edema, or deformity noted with normal full range of motion of all joints.    Diabetes Management Exam:    Foot Exam (with socks and/or shoes not present):       Sensory-Pinprick/Light touch:          Left medial foot (L-4): normal          Left dorsal foot (L-5): normal          Left lateral foot (S-1): normal          Right medial foot (L-4): normal          Right dorsal foot (L-5): normal          Right lateral foot (S-1): normal       Sensory-Monofilament:          Left foot: normal  Right foot: normal       Inspection:          Left foot: normal          Right foot: normal       Nails:          Left foot: normal          Right foot: normal   Impression & Recommendations:  Problem # 1:  HYPERTENSION (ICD-401.9) Assessment Unchanged BP excellently controlled.  asymptomatic.  no changes at this time. The following medications were removed from the medication list:    Amlodipine Besylate 5 Mg Tabs (Amlodipine besylate) .Marland Kitchen... 1 by mouth every day Her updated medication list for this problem includes:    Lisinopril-hydrochlorothiazide 20-12.5 Mg Tabs (Lisinopril-hydrochlorothiazide) .Marland Kitchen... Take one tablet two times a day  Problem #  2:  HYPERLIPIDEMIA (ICD-272.4) Assessment: Unchanged due for labs.  tolerating statin w/out difficulty.  adjust meds as needed. Her updated medication list for this problem includes:    Lipitor 10 Mg Tabs (Atorvastatin calcium) .Marland Kitchen... 1 by mouth at bedtime  Orders: TLB-Lipid Panel (80061-LIPID) TLB-Hepatic/Liver Function Pnl (80076-HEPATIC) Specimen Handling ([address])  Problem # 3:  DIABETES-TYPE 2 (ICD-250.00) Assessment: Unchanged due for eye exam.  plans on seeing ophtho.  will adjust meds based on labs.  attempting to find nutritionist close to home. Her updated medication list for this problem includes:    Lisinopril-hydrochlorothiazide 20-12.5 Mg Tabs (Lisinopril-hydrochlorothiazide) .Marland Kitchen... Take one tablet two times a day    Glucophage Xr 500 Mg Xr24h-tab (Metformin hcl) .Marland Kitchen... 2 by mouth at bedtime  Orders: Venipuncture ([address]) TLB-A1C / Hgb A1C (Glycohemoglobin) (83036-A1C) TLB-BMP (Basic Metabolic Panel-BMET) (80048-METABOL) Specimen Handling ([address])  Problem # 4:  THYROMEGALY (ICD-240.9) Assessment: Unchanged check labs and refer for [place]. Orders: TLB-TSH (Thyroid Stimulating Hormone) [id number]) Radiology Referral (Radiology)  Complete Medication List: 1)  Lisinopril-hydrochlorothiazide 20-12.5 Mg Tabs (Lisinopril-hydrochlorothiazide) .... Take one tablet two times a day 2)  Nasacort Aq 55 Mcg/act Aers (Triamcinolone acetonide(nasal)) .... Two sprays each nostril once daily 3)  Lipitor 10 Mg Tabs (Atorvastatin calcium) .Marland Kitchen.. 1 by mouth at bedtime 4)  Glucophage Xr 500 Mg Xr24h-tab (Metformin hcl) .... 2 by mouth at bedtime 5)  One Touch Delica Lancets Misc (Lancets) .... Test sugars two times a day 6)  Onetouch Ultra Blue Strp (Glucose blood) .... Test two times a day  Other Orders: Gastroenterology Referral (GI)  Patient Instructions: 1)  Please schedule a follow-up appointment in 3 months to recheck your diabetes- you can eat before this appt. 2)  Someone will call  you with your Ultrasound and GI appts 3)  We'll notify you of your lab results 4)  You look great!  Keep up the good work! 5)  Happy Holidays!!!    Orders Added: 1)  Venipuncture [36415] 2)  TLB-A1C / Hgb A1C (Glycohemoglobin) [83036-A1C] 3)  TLB-BMP (Basic Metabolic Panel-BMET) [80048-METABOL] 4)  TLB-TSH (Thyroid Stimulating Hormone) [84443-TSH] 5)  TLB-Lipid Panel [80061-LIPID] 6)  TLB-Hepatic/Liver Function Pnl [80076-HEPATIC] 7)  Gastroenterology Referral [GI] 8)  Specimen Handling [99000] 9)  Radiology Referral [Radiology] 10)  Est. Patient Level IV [62130]  Appended Document: followup/kn    Clinical Lists Changes  Observations: Added new observation of FLU VAX: declined (02/25/2010 13:36)       Immunization History:  Influenza Immunization History:    Influenza:  declined (02/25/2010)

## 2010-05-28 NOTE — Progress Notes (Signed)
Summary: refill  Phone Note Refill Request Message from:  [facility] fax [phone]  Refills Requested: Medication #1:  ONE TOUCH DELICA LANCETS  MISC Test sugars two times a day Initial call taken by: Barb Merino,  June 05, 2009 10:28 AM  Follow-up for Phone Call        Pt has refills on file. Army Fossa CMA  June 05, 2009 11:18 AM

## 2010-05-28 NOTE — Progress Notes (Signed)
Summary: appt [id number]  Phone Note Outgoing Call   Call placed by: Army Fossa CMA,  October 09, 2009 7:58 AM Reason for Call: Confirm/change Appt Summary of Call: Pt needs OV and labwork. Army Fossa CMA  October 09, 2009 7:58 AM   Follow-up for Phone Call        patient had  appt scheduled [id number] Follow-up by: Okey Regal Spring,  October 09, 2009 11:04 AM

## 2010-05-28 NOTE — Progress Notes (Signed)
Summary: refill  Phone Note Refill Request Message from:  Fax from Pharmacy on October 09, 2009 11:12 AM  Refills Requested: Medication #1:  LISINOPRIL-HYDROCHLOROTHIAZIDE 20-12.5 MG TABS take one tablet two times a day [facility] [address] [place] - fax [phone] ------patient already had appt scheduled [id number]  Initial call taken by: Okey Regal Spring,  October 09, 2009 11:13 AM    Prescriptions: LISINOPRIL-HYDROCHLOROTHIAZIDE 20-12.5 MG TABS (LISINOPRIL-HYDROCHLOROTHIAZIDE) take one tablet two times a day  #60 x 0   Entered by:   Army Fossa CMA   Authorized by:   Loreen Freud DO   Signed by:   Army Fossa CMA on 10/09/2009   Method used:   Electronically to        [facility]* (retail)       [address]       [place], [place]  [address]       Ph: [phone]       Fax: [phone]   RxID:   [id number]

## 2010-05-28 NOTE — Progress Notes (Signed)
Summary: Refill Request  Phone Note Refill Request Message from:  Pharmacy on [facility] [address] Fax #: [phone]  Refills Requested: Medication #1:  LISINOPRIL-HYDROCHLOROTHIAZIDE 20-12.5 MG TABS take one tablet two times a day   Dosage confirmed as above?Dosage Confirmed   Supply Requested: 1 month   Last Refilled: 07/13/2009 Next Appointment Scheduled: none Initial call taken by: Harold Barban,  August 11, 2009 9:23 AM    Prescriptions: LISINOPRIL-HYDROCHLOROTHIAZIDE 20-12.5 MG TABS (LISINOPRIL-HYDROCHLOROTHIAZIDE) take one tablet two times a day  #60 x 0   Entered by:   Kandice Hams   Authorized by:   Neena Rhymes MD   Signed by:   Kandice Hams on 08/11/2009   Method used:   Faxed to ...       [facility]* (retail)       [address]       [place], [place]  [address]       Ph: [phone]       Fax: [phone]   RxID:   [id number]

## 2010-05-28 NOTE — Progress Notes (Signed)
Summary: Lab Results (lmom 2/1,2/2,2/9)  Phone Note Outgoing Call   Call placed by: Army Fossa CMA,  May 27, 2009 10:08 AM Summary of Call: Regarding lab results, LMTCB:  Hgba1c---  Your hgba1c should be <_7____.  Watch your simple sugars (cakes, cookies) and starches, white flour etc.  We will recheck fasting labs in _3___ months.  Diet and exercise are important for this.  If you have not seen a nutritionist that would be helpful to lower your blood sugar.  If BS are still running high we will increase the glucophage XR 1000 qpm.     250.00  hgba1c, bmp,  272.4  lipid, hep Signed by Loreen Freud DO on 05/24/2009 at 4:22 PM  Follow-up for Phone Call        [facility]. Army Fossa CMA  May 28, 2009 1:31 PM   Additional Follow-up for Phone Call Additional follow up Details #1::        LMTCB. Army Fossa CMA  June 04, 2009 1:56 PM     Additional Follow-up for Phone Call Additional follow up Details #2::    mailed letter to pt also. Army Fossa CMA  June 11, 2009 1:50 PM

## 2010-06-11 NOTE — Progress Notes (Signed)
Summary: different med-  Phone Note Refill Request Call back at Home Phone [phone] Message from:  Patient on May 27, 2010 12:01 PM  Refills Requested: Medication #1:  LIPITOR 10 MG TABS 1 by mouth at bedtime patient said copay for lipitor has gone up - she wants to know it she can have rx called in for simvastin  which she has taken before - [facility] [address] Regan Rakers  ---[phone]   Initial call taken by: Okey Regal Spring,  May 27, 2010 12:05 PM  Follow-up for Phone Call        left message on machine .......Marland KitchenDoristine Devoid CMA  May 27, 2010 2:18 PM   spoke w/ patient aware medication changed and sent to pharmacy ....Marland KitchenMarland KitchenDoristine Devoid CMA  June 04, 2010 4:36 PM     New/Updated Medications: SIMVASTATIN 40 MG TABS (SIMVASTATIN) take one tablet at bedtime Prescriptions: SIMVASTATIN 40 MG TABS (SIMVASTATIN) take one tablet at bedtime  #30 x 6   Entered by:   Doristine Devoid CMA   Authorized by:   Neena Rhymes MD   Signed by:   Doristine Devoid CMA on 06/04/2010   Method used:   Electronically to        [facility]* (retail)       [address]       [place], [place]  [address]       Ph: [phone]       Fax: [phone]   RxID:   [id number]

## 2010-06-12 ENCOUNTER — Ambulatory Visit: Payer: Self-pay | Admitting: Family Medicine

## 2010-07-03 ENCOUNTER — Other Ambulatory Visit: Payer: Self-pay | Admitting: Family Medicine

## 2010-07-03 ENCOUNTER — Ambulatory Visit (INDEPENDENT_AMBULATORY_CARE_PROVIDER_SITE_OTHER): Payer: 59 | Admitting: Family Medicine

## 2010-07-03 ENCOUNTER — Encounter: Payer: Self-pay | Admitting: Family Medicine

## 2010-07-03 DIAGNOSIS — E049 Nontoxic goiter, unspecified: Secondary | ICD-10-CM

## 2010-07-03 DIAGNOSIS — I10 Essential (primary) hypertension: Secondary | ICD-10-CM

## 2010-07-03 DIAGNOSIS — E119 Type 2 diabetes mellitus without complications: Secondary | ICD-10-CM

## 2010-07-03 DIAGNOSIS — M62838 Other muscle spasm: Secondary | ICD-10-CM | POA: Insufficient documentation

## 2010-07-03 LAB — BASIC METABOLIC PANEL
BUN: 12 mg/dL (ref 6–23)
Calcium: 9.5 mg/dL (ref 8.4–10.5)
Creatinine, Ser: 0.9 mg/dL (ref 0.4–1.2)
GFR: 89.11 mL/min (ref >60.00)
Glucose, Bld: 85 mg/dL (ref 70–99)

## 2010-07-06 ENCOUNTER — Other Ambulatory Visit: Payer: Self-pay | Admitting: Family Medicine

## 2010-07-06 DIAGNOSIS — E049 Nontoxic goiter, unspecified: Secondary | ICD-10-CM

## 2010-07-07 NOTE — Assessment & Plan Note (Signed)
Summary: diabetes followup///sph   Vital Signs:  Patient profile:   45 year old female Height:      65 inches (165.10 cm) Weight:      172.13 pounds (78.24 kg) BMI:     28.75 Temp:     97.7 degrees F (36.50 degrees C) oral BP sitting:   110 / 76  (left arm) Cuff size:   regular  Vitals Entered By: Lucious Groves CMA (July 03, 2010 11:14 AM) CC: Diabetic follow up./kb Is Patient Diabetic? Yes Pain Assessment Patient in pain? no      Comments Patient notes that she would like to see podiatry.   History of Present Illness: 45 yo woman here today for   diabetes- fasting CBGs  ~115.  taking Metformin.  has had rare shakiness when she doesn't eat.  no CP, SOB, HAs, visual changes, edema.  would like referral to podiatry for chronic callous.  hand/foot spasms and cramps- will wake up w/ hands spasming.  limited water intake.  HTN- BP is excellent today.  asymptomatic  Current Medications (verified): 1)  Lisinopril-Hydrochlorothiazide 20-12.5 Mg Tabs (Lisinopril-Hydrochlorothiazide) .... Take One Tablet Two Times A Day 2)  Nasacort Aq 55 Mcg/act Aers (Triamcinolone Acetonide(Nasal)) .... Two Sprays Each Nostril Once Daily 3)  Simvastatin 40 Mg Tabs (Simvastatin) .... Take One Tablet At Bedtime 4)  Glucophage Xr 500 Mg Xr24h-Tab (Metformin Hcl) .... 2 By Mouth At Bedtime 5)  One Touch Delica Lancets  Misc (Lancets) .... Test Sugars Two Times A Day 6)  Onetouch Ultra Blue  Strp (Glucose Blood) .... Test Two Times A Day 7)  Cheratussin Ac 100-10 Mg/82ml Syrp (Guaifenesin-Codeine) .Marland Kitchen.. 1-2 Tsps Q4-6 As Needed For Cough.  Allergies (verified): No Known Drug Allergies  Past History:  Past medical, surgical, family and social histories (including risk factors) reviewed, and no changes noted (except as noted below).  Past Medical History: Reviewed history from 02/25/2010 and no changes required. Hypertension Migraines DM Hyperlipidemia  Past Surgical History: Reviewed history  from 08/08/2008 and no changes required. Hysterectomy-ovaries remain  Family History: Reviewed history from 08/08/2008 and no changes required. CAD-no HTN-father, grandparents DM-grandfather STROKE-sister 20's due to OCPs COLON CA-maternal aunt BREAST CA-no sister w/ MS  Social History: Reviewed history from 08/08/2008 and no changes required. 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 No apparent alopecia or balding.  Eyes:  PERRL, EOMI Neck:  generalized thyromegaly, no nodularity Lungs:  Normal respiratory effort, chest expands symmetrically. Lungs are clear to auscultation, no crackles or wheezes. Heart:  normal rate and no murmur.   Abdomen:  soft, NT/ND, +BS Msk:  dropped 2nd metatarsal head on R- callous present on plantar surface Pulses:  +2 carotid, radial, DP Extremities:  No clubbing, cyanosis, edema, or deformity noted   Diabetes Management Exam:    Foot Exam (with socks and/or shoes not present):       Sensory-Pinprick/Light touch:          Left medial foot (L-4): normal          Left dorsal foot (L-5): normal          Left lateral foot (S-1): normal          Right medial foot (L-4): normal          Right  dorsal foot (L-5): normal          Right lateral foot (S-1): normal       Sensory-Monofilament:          Left foot: normal          Right foot: normal       Inspection:          Left foot: normal          Right foot: normal       Nails:          Left foot: thickened          Right foot: thickened   Impression & Recommendations:  Problem # 1:  DIABETES-TYPE 2 (ICD-250.00) Assessment Unchanged check labs.  adjust meds as needed.  pt has gained weight.  again reviewed the importance of healthy diet and regular  exercise.  will follow closely. Her updated medication list for this problem includes:    Lisinopril-hydrochlorothiazide 20-12.5 Mg Tabs (Lisinopril-hydrochlorothiazide) .Marland Kitchen... Take one tablet two times a day    Glucophage Xr 500 Mg Xr24h-tab (Metformin hcl) .Marland Kitchen... 2 by mouth at bedtime  Orders: Venipuncture (11914) TLB-A1C / Hgb A1C (Glycohemoglobin) (83036-A1C) TLB-BMP (Basic Metabolic Panel-BMET) (80048-METABOL) Specimen Handling (78295) Podiatry Referral (Podiatry)  Problem # 2:  MUSCLE SPASM (ICD-728.85) Assessment: New limited water intake.  will  check BMP to assess K+.  reviewed foods high in K+ and the importance of water.  will follow.  Problem # 3:  HYPERTENSION (ICD-401.9) Assessment: Unchanged excellent control.  asymptomatic Her updated medication list for this problem includes:    Lisinopril-hydrochlorothiazide 20-12.5 Mg Tabs (Lisinopril-hydrochlorothiazide) .Marland Kitchen... Take one tablet two times a day  Problem # 4:  THYROMEGALY (ICD-240.9) Assessment: Unchanged  will attempt to have US done at [facility] in [place] as this is pt's preference.  Orders: Radiology Referral (Radiology)  Complete Medication List: 1)  Lisinopril-hydrochlorothiazide 20-12.5 Mg Tabs (Lisinopril-hydrochlorothiazide) .... Take one tablet two times a day 2)  Nasacort Aq 55 Mcg/act Aers (Triamcinolone acetonide(nasal)) .... Two sprays each nostril once daily 3)  Simvastatin 40 Mg Tabs (Simvastatin) .... Take one tablet at bedtime 4)  Glucophage Xr 500 Mg Xr24h-tab (Metformin hcl) .... 2 by mouth at bedtime 5)  One Touch Delica Lancets Misc (Lancets) .... Test sugars two times a day 6)  Onetouch Ultra Blue Strp (Glucose blood) .... Test two times a day 7)  Cheratussin Ac 100-10 Mg/12ml Syrp (Guaifenesin-codeine) .Marland Kitchen.. 1-2 tsps q4-6 as needed for cough.  Patient Instructions: 1)  Please schedule your physical in 3-4 months- do not eat before this appt 2)  We'll notify you of your lab results 3)  We'll  call you with your thyroid and podiatry appts 4)  Keep up the good work on diet and exercise 5)  Call with any questions or concerns 6)  Have a great weekend!!! Prescriptions: CHERATUSSIN AC 100-10 MG/5ML SYRP (GUAIFENESIN-CODEINE) 1-2 tsps Q4-6 as needed for cough.  #150 x 0   Entered and Authorized by:   Neena Rhymes MD   Signed by:   Neena Rhymes MD on 07/03/2010   Method used:   Print then Give to Patient   RxID:   [id number]    Orders Added: 1)  Venipuncture [52841] 2)  TLB-A1C / Hgb A1C (Glycohemoglobin) [83036-A1C] 3)  TLB-BMP (Basic Metabolic Panel-BMET) [80048-METABOL] 4)  Specimen Handling [99000] 5)  Est. Patient Level IV [32440] 6)  Radiology Referral [Radiology] 7)  Podiatry Referral [Podiatry]

## 2010-07-10 ENCOUNTER — Other Ambulatory Visit: Payer: 59

## 2010-07-18 ENCOUNTER — Other Ambulatory Visit: Payer: Self-pay | Admitting: Family Medicine

## 2010-09-11 NOTE — Discharge Summary (Signed)
[name], Dana Payne              ACCOUNT NO.:  [id number]   MEDICAL RECORD NO.:  [id number]          PATIENT TYPE:  INP   LOCATION:  9302                          [facility]ILITY:  [facility]   PHYSICIAN:  Juan H. Lily Peer, M.D.DATE OF BIRTH:  Sep 07, 1965   DATE OF ADMISSION:  09/12/2006  DATE OF DISCHARGE:  09/14/2006                               DISCHARGE SUMMARY   INDICATIONS FOR OPERATION:  45 year old gravida 0 with symptomatic  leiomyomatous uteri consisting of dysmenorrhea, menorrhagia,  dyspareunia, was taken to the operating room on the morning of May 9  where she underwent total abdominal hysterectomy.  The patient had  multiple leiomyomas, did  well intraoperatively, had a blood loss 150  mL.  For postoperative analgesia she had a On   Q with half percent  Marcaine.  Catheters had been placed between the peritoneum and fascia.  The patient post day #1 she was afebrile.  Her hemoglobin/hematocrit  with 13.1 and 38.5 respectively with platelet count 248,000.  The  patient had adequate urine output the previous 24 hours of 1900 mL and  clear and catheter had been removed.  Her diet and advance clear to  regular diet she was up and ambulating and the postoperatively in less  than 24 hours she used only a total of 1.2 mg (4 mL) of Dilaudid from  the PCA pump and required only ibuprofen on her second postoperative  day.  She was up and ambulating, tolerating regular diet well, was  afebrile and was ready to be discharged home.   FINAL DIAGNOSES:  1. Leiomyomatous uteri.  2. Dysmenorrhea.  3. Menorrhagia.   PROCEDURE PERFORMED:  Total abdominal hysterectomy.   DISPOSITION AND FOLLOW-UP:  The patient was discharged home on her  second postoperative day.  She was up and ambulating tolerating regular  diet.  She had been given a prescription of Lortab 7.5/500 take one p.o.  q.4-6 h. p.r.n. pain and Reglan 10 mg one p.o. q. 4-6 hours p.r.n.  nausea.  The patient was instructed to return to  the office the end of  the week of her staples removed and she is to pull out the two catheters  from the On-Q catheter for pain relief tomorrow.      Juan H. Lily Peer, M.D.  Electronically Signed     JHF/MEDQ  D:  09/14/2006  T:  09/14/2006  Job:  [id number]

## 2010-09-11 NOTE — Op Note (Signed)
NAMEGABRYELLA, MURFIN              ACCOUNT NO.:  [id number]   MEDICAL RECORD NO.:  [id number]          PATIENT TYPE:  AMB   LOCATION:  SDC                           [facility]   PHYSICIAN:  Juan H. Lily Peer, M.D.DATE OF BIRTH:  May 20, 1965   DATE OF PROCEDURE:  09/12/2006  DATE OF DISCHARGE:                               OPERATIVE REPORT   SURGEON:  Juan H. Lily Peer, M.D.   INDICATIONS FOR OPERATION:  This is a 45 year old gravida 0 with  symptomatic leiomyomatous uteri consisting of dysmenorrhea, menorrhagia,  and dyspareunia.   PREOPERATIVE DIAGNOSES:  1. Leiomyomatous uteri.  2. Dysmenorrhea.  3. Menorrhagia.  4. Dyspareunia.   POSTOPERATIVE DIAGNOSES:  1. Leiomyomatous uteri.  2. Dysmenorrhea.  3. Menorrhagia.  4. Dyspareunia.  5. Endometriosis.   ANESTHESIA:  General endotracheal anesthesia.   FIRST ASSISTANT:  Edyth Gunnels, M.D.   PROCEDURE PERFORMED:  Total abdominal hysterectomy.   FINDINGS:  Patient with multiple subserosal or intramural leiomyomas.  Normal-appearing left ovary with a small hemorrhagic cyst that collapsed  during the operation and on the right, what appeared on ultrasound to  be, a septated ovary.  It was a paratubal cyst.  Also patient with some  puckering of the right ovary adjacent to the sidewall of the uterus.  Otherwise the right ovary appeared normal.   DESCRIPTION OF OPERATION:  After the patient adequately counseled, she  was taken to the operating room where she underwent a successful general  endotracheal anesthesia.  She had received a gram of cefoxitin  prophylactically and had PSA stockings for DVT prophylaxis as well.  After a Foley catheter was in place, the abdomen was prepped and draped  in the usual sterile fashion.  A Pfannenstiel skin incision was made 2  cm above the symphysis pubis.  Of note, 0.25% Marcaine had been  infiltrated into the planned incision site for a total 10 mL.  The  incision had been carried  down from the skin and subcutaneous tissue  down to the rectus, fascia whereby a midline nick was made.  The fascia  was incised in a transverse fashion.  The midline raphe was entered.  The O'Connor-O'Sullivan retractors were in place.  The patient was  placed in slight Trendelenburg position.  At this point, two Heaney  clamps were placed on each side of the uterus for traction.  The right  round ligament was identified and was transected, and the anterior broad  ligament was incised to the level of the internal cervical os.  It is at  this time it was noted that the right ovary was adhered to the  posterolateral aspect of the uterus and required meticulous dissection  to free it.  It appears that probably the patient has some underlying  endometriosis.  This was serially clamped, cut, and suture ligated with  0 Vicryl suture to the level of the uterine arteries and to the right  vaginal fornix.  Similar procedure was carried out on the contralateral  side with leaving both ovaries in place.  The Jorgenson scissors were  utilized to remove the cervix and the  vagina.  Both angles were secured  with a transfixion stitch of 0 Vicryl suture, and the remaining vaginal  cuff was closed with interrupted sutures of 0 Vicryl suture.  The pelvic  cavity was copiously irrigated with normal saline solution, and after  ascertaining adequate hemostasis, closure was commenced. The sponge  count and needle count were correct.  The visceral peritoneum was  reapproximated with 3-0 Vicryl suture.  The on-q catheters for  postoperative analgesia were inserted with two small lateral trocars  lateral to the to the incision and were placed between the peritoneum  and the fascia.  The fascia was then closed with running stitch of 0  Vicryl suture.  The skin was reapproximated with skin clips followed by  placement of Xeroform gauze.  The on-q catheters were flushed with 1%  Xylocaine for a total 10 mL, five per  each port, and the patient will be  receiving 0.5% Marcaine to these catheters for approximately 8 mL total  per hour for postoperative analgesia.  The patient was extubated and  transferred to recovery with stable vital signs.  Blood loss was 150 mL.  Urine output 250 mL and clear and IV fluids 1500 mL of lactated  Ringer's.      Juan H. Lily Peer, M.D.  Electronically Signed     JHF/MEDQ  D:  09/12/2006  T:  09/12/2006  Job:  [id number]

## 2010-09-11 NOTE — H&P (Signed)
NAMELOWANA, HABLE              ACCOUNT NO.:  [id number]   MEDICAL RECORD NO.:  [id number]          PATIENT TYPE:  AMB   LOCATION:  SDC                           [facility]   PHYSICIAN:  Juan H. Lily Peer, M.D.DATE OF BIRTH:  March 24, 1966   DATE OF ADMISSION:  DATE OF DISCHARGE:                              HISTORY & PHYSICAL   The patient is scheduled for surgery on Monday, May 19th, at 7:30 a.m.  at [facility].  Please have history and physical available.   CHIEF COMPLAINT:  Symptomatic leiomyomatous uteri.   HISTORY:  The patient is a 45 year old gravida 0, with symptomatic  leiomyomatous uteri, consistent with dysmenorrhea, menorrhagia and  dyspareunia.  The patient had been followed in [place], [place] by  another gynecologist and had been monitored with ultrasounds, and her  fibroid uterus has continued to increase in size.  Also, she had  bilateral ovarian cysts.  A followup ultrasound in our office on May  13th at time of preoperative consultation demonstrated that a previously  seen right ovarian complex cystic mass with thin septation measuring 25  x 18 x 17 mm was noted with negative colorful Doppler.  Thin wall cystic  mass measured 15 x 7 mm, negative color flow Doppler with internal low-  level echoes were seen, but interestingly, the previous soft-tissue mass  was not seen.  The left ovary had a complex cystic mass not seen and a  continued presence at the superior portion of the ovary measuring 17 x  16-mm with __________ color flow Doppler and no change.  The patient's  recent pap smear this year in January of this year was normal.  Previous  TSH random blood sugar was normal.  Cholesterol was 235, and her last  hemoglobin in January of this year was 14.9 with a hematocrit of 43.9  and platelet count 296,000.  Patient would like to proceed with  definitive surgery.  She has approximately 10 fibroids, intramural  subserosal of different sizes.  Her  and her husband were in the office  for pre-operative consultation.  They are not interested in having  children, full aware that after this procedure they will not be able to  have any more children and fully accept.   PAST MEDICAL HISTORY:  Patient's past medical history:  PATIENT DENIES ANY MEDICAL ALLERGIES.  No previous hospitalization.   SOCIAL HISTORY:  Denies smoking or alcohol consumption.   PAST SURGICAL HISTORY:  No prior surgery.   FAMILY HISTORY:  Significant for diabetes, hypertension, colon cancer  and an aunt with breast cancer.   PHYSICAL EXAMINATION:  VITAL SIGNS:  The patient weighs 171 pounds,  weight 5 feet 6 inches tall, blood pressure 120/78.  HEENT:  Unremarkable.  NECK:  Supple.  Trachea midline.  No carotid bruits, no thyromegaly.  LUNGS:  Clear to auscultation without any rhonchi or wheezing.  HEART:  Regular rate and rhythm.  No murmurs or gallops.  BREAST:  Exam not done.  ABDOMEN:  Soft, nontender.  No rebound or guarding.  PELVIC:  Bartholin, urethra, Skene glands within normal limits.  Vagina  and cervix:  No gross lesion on inspection.  Uterus approximately 10-12  week size, irregular shape.  Adnexa difficult to evaluate.  RECTAL:  Deferred.   ASSESSMENT:  A 45 year old gravida 0 with symptoms leiomyomatous uteri  contributing to dysmenorrhea and menorrhagia and dyspareunia.  Patient's  ultrasound demonstrates she has over 10 fibroids of various sizes,  subserosal, as well as intramural, the largest one recorded at 4 x 3-cm  size, and patient with bilateral ovarian cysts is scheduled to undergo  total abdominal hysterectomy with bilateral ovarian cystectomy.  The  patient fully aware that she will not be able to have children.  Her  husband is present during the pre-operative consultation.  They are  fully aware and they fully accept and are content with this decision,  and the remote possibility that both ovaries need to be removed.  She is   also fully aware that she may need to be on hormone replacement therapy.  Other potential risks of the operation include infection, although she  will receive prophylaxed antibiotic; the risk of deep venous thrombosis  and subsequent pulmonary embolism and also the risk of anaphylactic  reaction, hepatitis and AIDS, in the event if any blood transfusion or  blood products were to be administered.  The patient fully understands  all of the above and accepts.  All questions were answered and followed  accordingly.   PLAN:  Patient is scheduled for a total abdominal hysterectomy with  bilateral ovarian cystectomy on Monday, May 19th, at [facility].  Please have history and physical available.      Juan H. Lily Peer, M.D.  Electronically Signed     JHF/MEDQ  D:  09/11/2006  T:  09/11/2006  Job:  [id number]

## 2010-09-30 ENCOUNTER — Telehealth: Payer: Self-pay

## 2010-09-30 NOTE — Telephone Encounter (Signed)
Pt husband called left msg on voicemail would like a phone call to discuss test.   Tried calling pt several times just getting busy signal will try again later.

## 2010-09-30 NOTE — Telephone Encounter (Signed)
Spoke w/ pt says she has scheduled appt w/ GI Dr. Aaron Edelman to be seen.

## 2010-10-01 ENCOUNTER — Encounter: Payer: Self-pay | Admitting: Gastroenterology

## 2010-10-01 ENCOUNTER — Ambulatory Visit (HOSPITAL_COMMUNITY)
Admission: RE | Admit: 2010-10-01 | Discharge: 2010-10-01 | Disposition: A | Discharge status: Home / Self Care | Payer: 59 | Source: Ambulatory Visit | Attending: Gastroenterology | Admitting: Gastroenterology

## 2010-10-01 ENCOUNTER — Ambulatory Visit (INDEPENDENT_AMBULATORY_CARE_PROVIDER_SITE_OTHER): Payer: 59 | Admitting: Gastroenterology

## 2010-10-01 ENCOUNTER — Other Ambulatory Visit: Payer: Self-pay | Admitting: Gastroenterology

## 2010-10-01 VITALS — BP 117/78 | HR 113 | Temp 98.4°F | Ht 65.0 in | Wt 165.0 lb

## 2010-10-01 DIAGNOSIS — R109 Unspecified abdominal pain: Secondary | ICD-10-CM

## 2010-10-01 DIAGNOSIS — K625 Hemorrhage of anus and rectum: Secondary | ICD-10-CM

## 2010-10-01 DIAGNOSIS — K7689 Other specified diseases of liver: Secondary | ICD-10-CM | POA: Insufficient documentation

## 2010-10-01 DIAGNOSIS — E119 Type 2 diabetes mellitus without complications: Secondary | ICD-10-CM | POA: Insufficient documentation

## 2010-10-01 DIAGNOSIS — R1032 Left lower quadrant pain: Secondary | ICD-10-CM | POA: Insufficient documentation

## 2010-10-01 MED ORDER — IOHEXOL 300 MG/ML  SOLN
100.0000 mL | Freq: Once | INTRAMUSCULAR | Status: AC | PRN
  Administered 2010-10-01: 100 mL via INTRAVENOUS

## 2010-10-01 NOTE — Progress Notes (Signed)
Referring Provider: Neena Rhymes, MD Primary Care Physician:  Neena Rhymes, MD, MD Primary Gastroenterologist:  Dr. Jena Gauss  Chief Complaint  Patient presents with  . Rectal Bleeding    HPI:  Dana Payne is a 45 y.o. female here as a referral from Dr. Beverely Low for abdominal pain and rectal bleeding. 2 weeks ago, she noted blood in stool, painless. Large amount of blood this past Tuesday, clumps. Circle in bottom of commode. + paper hematochezia. June 6 presented to [facility] ED due to N/V/ and bloody diarrhea. Diarrhea was watery/bloody, lower abdominal pain like a "bubbling" and aching. Felt like it was hard to sleep at night. Pain described as intermittent, relieved slightly after bowel movement or passing gas. ED doctor gave her Omeprazole. Diarrhea has resolved. Now 1 BM daily, baseline prior to this episode was 1 BM only 2-3 times per week. Trends towards constipation. No prior hx of hematochezia or abdominal pain.  She denies any loss of appetite. N/V has since resolved. Denies dysphagia/odynophagia. Has occasional reflux. Hematochezia is tapering off. Pain has lessened. Denies NSAID, BC powder use. Only takes tylenol.  Labs in ED at [facility]: mild leukocytosis of 12.6. Hgb/Hct nl. PT/INR normal. No radiology procedures.  Past Medical History  Diagnosis Date  . GERD (gastroesophageal reflux disease)   . HTN (hypertension)   . Hypercholesterolemia   . Diabetes mellitus     Past Surgical History  Procedure Date  . Partial hysterectomy     Current Outpatient Prescriptions  Medication Sig Dispense Refill  . aspirin 81 MG tablet Take 81 mg by mouth daily.        Marland Kitchen lisinopril-hydrochlorothiazide (PRINZIDE,ZESTORETIC) 20-12.5 MG per tablet Take 1 tablet by mouth daily.        . metFORMIN (GLUCOPHAGE-XR) 500 MG 24 hr tablet       . omeprazole (PRILOSEC) 20 MG capsule       . ONE TOUCH ULTRA TEST test strip USE TO TEST BLOOD SUGAR TWICE DAILY  100 each  2  . simvastatin  (ZOCOR) 40 MG tablet       . triamcinolone (NASACORT) 55 MCG/ACT nasal inhaler Place 2 sprays into the nose daily.         No current facility-administered medications for this visit.               Allergies as of 10/01/2010  . (No Known Allergies)    Family History  Problem Relation Age of Onset  . Colon polyps Maternal Aunt   . Colon polyps Paternal Uncle   . Colon cancer Maternal Aunt     in 47s when diagnosed.     History   Social History  . Marital Status: Single    Spouse Name: N/A    Number of Children: N/A  . Years of Education: N/A   Occupational History  . Not on file.   Social History Main Topics  . Smoking status: Never Smoker   . Smokeless tobacco: Not on file  . Alcohol Use: No  . Drug Use: No  . Sexually Active: Not on file   Other Topics Concern  . Not on file     Review of Systems: Gen: Denies any fever, chills, sweats, anorexia, fatigue, weakness, malaise, weight loss, and sleep disorder CV: Denies chest pain, angina, palpitations, syncope, orthopnea, PND, peripheral edema, and claudication. Resp: Denies dyspnea at rest, dyspnea with exercise, cough, sputum, wheezing, coughing up blood, and pleurisy. GI: Denies vomiting blood, jaundice, and fecal incontinence.   Denies  dysphagia or odynophagia. GU : Denies urinary burning, blood in urine, urinary frequency, urinary hesitancy, nocturnal urination, and urinary incontinence. MS: Denies joint pain, limitation of movement, and swelling, stiffness, low back pain, extremity pain. Denies muscle weakness, cramps, atrophy.  Derm: Denies rash, itching, dry skin, hives, moles, warts, or unhealing ulcers.  Psych: Denies depression, anxiety, memory loss, suicidal ideation, hallucinations, paranoia, and confusion. Heme: Denies bruising, bleeding, and enlarged lymph nodes.  Physical Exam: BP 117/78  Pulse 113  Temp(Src) 98.4 F (36.9 C) (Temporal)  Ht 5\' 5"  (1.651 m)  Wt 165 lb (74.844 kg)  BMI 27.46  kg/m2 General:   Alert,  Well-developed, well-nourished, pleasant and cooperative in NAD Head:  Normocephalic and atraumatic. Eyes:  Sclera clear, no icterus.   Conjunctiva pink. Ears:  Normal auditory acuity. Nose:  No deformity, discharge,  or lesions. Mouth:  No deformity or lesions, dentition normal. Neck:  Supple; no masses or thyromegaly. Lungs:  Clear throughout to auscultation.   No wheezes, crackles, or rhonchi. No acute distress. Heart:  Regular rate and rhythm; no murmurs, clicks, rubs,  or gallops. Abdomen:  Soft, tender to palpation LLQ specifically.  nondistended. No masses, hepatosplenomegaly or hernias noted. Normal bowel sounds, without guarding, and without rebound.   Rectal:  Deferred until time of colonoscopy.   Msk:  Symmetrical without gross deformities. Normal posture. Extremities:  Without clubbing or edema. Neurologic:  Alert and  oriented x4;  grossly normal neurologically. Skin:  Intact without significant lesions or rashes. Cervical Nodes:  No significant cervical adenopathy. Psych:  Alert and cooperative. Normal mood and affect.

## 2010-10-01 NOTE — Assessment & Plan Note (Signed)
45 year old African-American female with moderate amount of hematochezia noted 2 weeks ago and then large amount of brbpr Tuesday, associated with lower abdominal cramping, N/V, blood diarrhea, prompting presentation to [facility] ED. Mild leukocytosis noted at 12.6 in ED. Hgb stable. Afebrile, no chills. Prior to episode, averaged a BM 2-3 X per week. Rectal bleeding is tapering off, N/V now resolved. Continued lower abdominal discomfort, but relieved with BM. Considerably tender LLQ abdomen. Has FH of colon cancer (maternal aunt); therefore, a colonoscopy is warranted in the near future. However, due to physical exam of LLQ tenderness, mild leukocytosis, acute onset of rectal bleeding, will proceed with CT scan prior to scheduling colonoscopy. Differentials to include ischemic vs infectious colitis, less likely IBD or diverticulitis.   Proceed with CT abd/pelvis today F/U on results and call pt tomorrow, 10/02/10 Follow clear liquids for now; advance to low residue when pain resolves Proceed with TCS with Dr. Jena Gauss in near future once CT reviewed and issues addressed: the risks, benefits, and alternatives have been discussed with the patient in detail. The patient states understanding and desires to proceed.

## 2010-10-01 NOTE — Patient Instructions (Signed)
Complete CT scan. We will be calling you with results and further plan,.  You will ultimately be needing a colonoscopy in the near future.  Monitor for any further signs of severe pain, nausea or vomiting, and large amounts of rectal bleeding.  Follow a clear liquid diet; slowly advance to a low-residue diet.  We will be in touch shortly.

## 2010-10-01 NOTE — Assessment & Plan Note (Signed)
See "rectal bleeding."

## 2010-10-02 NOTE — Progress Notes (Signed)
Cc to PCP 

## 2010-10-02 NOTE — Progress Notes (Signed)
L sided colitis on CT ; no obvious surgical process; need to proceed on w plans for TCS soon

## 2010-10-05 ENCOUNTER — Encounter: Payer: Self-pay | Admitting: Internal Medicine

## 2010-10-05 NOTE — Progress Notes (Signed)
Tried to call pt at both numbers- unable to reach, will call back

## 2010-10-05 NOTE — Progress Notes (Signed)
Pt is scheduled for TCS on 10/14/10- Instructions mailed and went over with pt- cdg

## 2010-10-05 NOTE — Progress Notes (Signed)
Pt aware, CG to schedule tcs

## 2010-10-14 ENCOUNTER — Encounter: Payer: 59 | Admitting: Internal Medicine

## 2010-10-15 ENCOUNTER — Ambulatory Visit (HOSPITAL_COMMUNITY)
Admission: RE | Admit: 2010-10-15 | Discharge: 2010-10-15 | Disposition: A | Discharge status: Home / Self Care | Payer: 59 | Source: Ambulatory Visit | Attending: Internal Medicine | Admitting: Internal Medicine

## 2010-10-15 ENCOUNTER — Encounter: Payer: 59 | Admitting: Internal Medicine

## 2010-10-15 DIAGNOSIS — R109 Unspecified abdominal pain: Secondary | ICD-10-CM

## 2010-10-15 DIAGNOSIS — K921 Melena: Secondary | ICD-10-CM

## 2010-10-15 DIAGNOSIS — Z79899 Other long term (current) drug therapy: Secondary | ICD-10-CM | POA: Insufficient documentation

## 2010-10-15 DIAGNOSIS — R935 Abnormal findings on diagnostic imaging of other abdominal regions, including retroperitoneum: Secondary | ICD-10-CM

## 2010-10-15 DIAGNOSIS — Z7982 Long term (current) use of aspirin: Secondary | ICD-10-CM | POA: Insufficient documentation

## 2010-10-15 DIAGNOSIS — K648 Other hemorrhoids: Secondary | ICD-10-CM | POA: Insufficient documentation

## 2010-10-15 DIAGNOSIS — R933 Abnormal findings on diagnostic imaging of other parts of digestive tract: Secondary | ICD-10-CM

## 2010-10-15 DIAGNOSIS — E119 Type 2 diabetes mellitus without complications: Secondary | ICD-10-CM | POA: Insufficient documentation

## 2010-10-15 DIAGNOSIS — E785 Hyperlipidemia, unspecified: Secondary | ICD-10-CM | POA: Insufficient documentation

## 2010-10-15 DIAGNOSIS — I10 Essential (primary) hypertension: Secondary | ICD-10-CM | POA: Insufficient documentation

## 2010-10-19 ENCOUNTER — Other Ambulatory Visit (HOSPITAL_COMMUNITY): Payer: Self-pay | Admitting: Internal Medicine

## 2010-10-19 ENCOUNTER — Other Ambulatory Visit: Payer: 59 | Admitting: Gastroenterology

## 2010-10-19 ENCOUNTER — Ambulatory Visit (HOSPITAL_COMMUNITY)
Admit: 2010-10-19 | Discharge: 2010-10-19 | Disposition: A | Discharge status: Home / Self Care | Payer: 59 | Source: Ambulatory Visit | Attending: Internal Medicine | Admitting: Internal Medicine

## 2010-10-19 DIAGNOSIS — Q649 Congenital malformation of urinary system, unspecified: Secondary | ICD-10-CM | POA: Insufficient documentation

## 2010-10-19 DIAGNOSIS — K7689 Other specified diseases of liver: Secondary | ICD-10-CM | POA: Insufficient documentation

## 2010-10-19 DIAGNOSIS — R933 Abnormal findings on diagnostic imaging of other parts of digestive tract: Secondary | ICD-10-CM | POA: Insufficient documentation

## 2010-10-19 DIAGNOSIS — R935 Abnormal findings on diagnostic imaging of other abdominal regions, including retroperitoneum: Secondary | ICD-10-CM

## 2010-10-19 DIAGNOSIS — K921 Melena: Secondary | ICD-10-CM | POA: Insufficient documentation

## 2010-10-19 MED ORDER — GADOBENATE DIMEGLUMINE 529 MG/ML IV SOLN
16.0000 mL | Freq: Once | INTRAVENOUS | Status: AC | PRN
  Administered 2010-10-19: 16 mL via INTRAVENOUS

## 2010-11-05 ENCOUNTER — Encounter (INDEPENDENT_AMBULATORY_CARE_PROVIDER_SITE_OTHER): Payer: 59 | Admitting: Gynecology

## 2010-11-05 ENCOUNTER — Other Ambulatory Visit (HOSPITAL_COMMUNITY)
Admission: RE | Admit: 2010-11-05 | Discharge: 2010-11-05 | Disposition: A | Discharge status: Home / Self Care | Payer: 59 | Source: Ambulatory Visit | Attending: Gynecology | Admitting: Gynecology

## 2010-11-05 ENCOUNTER — Ambulatory Visit (INDEPENDENT_AMBULATORY_CARE_PROVIDER_SITE_OTHER): Payer: 59 | Admitting: Family Medicine

## 2010-11-05 ENCOUNTER — Other Ambulatory Visit: Payer: Self-pay | Admitting: Gynecology

## 2010-11-05 ENCOUNTER — Encounter: Payer: Self-pay | Admitting: Family Medicine

## 2010-11-05 DIAGNOSIS — E049 Nontoxic goiter, unspecified: Secondary | ICD-10-CM

## 2010-11-05 DIAGNOSIS — N951 Menopausal and female climacteric states: Secondary | ICD-10-CM

## 2010-11-05 DIAGNOSIS — D1803 Hemangioma of intra-abdominal structures: Secondary | ICD-10-CM

## 2010-11-05 DIAGNOSIS — E785 Hyperlipidemia, unspecified: Secondary | ICD-10-CM

## 2010-11-05 DIAGNOSIS — E119 Type 2 diabetes mellitus without complications: Secondary | ICD-10-CM

## 2010-11-05 DIAGNOSIS — Z124 Encounter for screening for malignant neoplasm of cervix: Secondary | ICD-10-CM | POA: Insufficient documentation

## 2010-11-05 DIAGNOSIS — E7800 Pure hypercholesterolemia, unspecified: Secondary | ICD-10-CM

## 2010-11-05 DIAGNOSIS — Z01419 Encounter for gynecological examination (general) (routine) without abnormal findings: Secondary | ICD-10-CM

## 2010-11-05 LAB — LIPID PANEL
Cholesterol: 137 mg/dL (ref 0–200)
HDL: 42.4 mg/dL (ref >39.00)
Triglycerides: 129 mg/dL (ref 0.0–149.0)
VLDL: 25.8 mg/dL (ref 0.0–40.0)

## 2010-11-05 LAB — HEMOGLOBIN A1C: Hgb A1c MFr Bld: 7 % — ABNORMAL HIGH (ref 4.6–6.5)

## 2010-11-05 NOTE — Patient Instructions (Signed)
Follow up in 3 months- you can eat before this appt We'll notify you of your lab results and adjust meds as needed The liver and thyroid studies were GOOD NEWS!!! It's called a hemangioma- a collection of blood vessels Call with any questions or concerns Have a great summer!!!

## 2010-11-05 NOTE — Progress Notes (Signed)
  Subjective:    Patient ID: Dana Payne, female    DOB: 1966-04-03, 45 y.o.   MRN: [id number]  HPI Hyperlipidemia- chronic problem for pt, taking Simvastatin daily.  No abd pain, N/V, myalgias  DM- chronic problem for pt, has decreased Metformin to 1 tab daily b/c of fear of 'spot on liver'.  No symptomatic lows, CP, SOB, HAs, visual changes.  Not recently exercising due to diverticulitis.  UTD on eye exam  Hemangioma- had f/u liver MRI which showed no worrisome lesions.  Thyroid nodules- had recent US which showed very small nodules, too small to bx, and was recommended to have repeat US in 6-12 months.   Review of Systems For ROS see HPI     Objective:   Physical Exam  Constitutional: She is oriented to person, place, and time. She appears well-developed and well-nourished. No distress.  HENT:  Head: Normocephalic and atraumatic.  Eyes: Conjunctivae and EOM are normal. Pupils are equal, round, and reactive to light.  Neck: Normal range of motion. Neck supple. Thyromegaly (generalized fullness w/out discrete nodule) present.  Cardiovascular: Normal rate, regular rhythm, normal heart sounds and intact distal pulses.   No murmur heard. Pulmonary/Chest: Effort normal and breath sounds normal. No respiratory distress.  Abdominal: Soft. She exhibits no distension. There is no tenderness.  Musculoskeletal: She exhibits no edema.  Lymphadenopathy:    She has no cervical adenopathy.  Neurological: She is alert and oriented to person, place, and time.  Skin: Skin is warm and dry.  Psychiatric: She has a normal mood and affect. Her behavior is normal.          Assessment & Plan:

## 2010-11-06 ENCOUNTER — Other Ambulatory Visit: Payer: Self-pay | Admitting: Family Medicine

## 2010-11-09 DIAGNOSIS — D1803 Hemangioma of intra-abdominal structures: Secondary | ICD-10-CM | POA: Insufficient documentation

## 2010-11-09 NOTE — Assessment & Plan Note (Signed)
Pt decreased her medicine to 1 tab due to fear she was harming her liver in the setting of test results she did not understand.  Reassured her that she was not harming her liver w/ metformin- will check labs and make med adjustments prn.  Pt expressed understanding and is in agreement w/ plan.

## 2010-11-09 NOTE — Op Note (Signed)
  NAMENEYSA, ARTS              ACCOUNT NO.:  [id number]  MEDICAL RECORD NO.:  [id number]  LOCATION:  [facility]                          FACILITY:  [facility]  PHYSICIAN:  R. Roetta Sessions, MD FACP FACGDATE OF BIRTH:  [birth year]  DATE OF PROCEDURE:  10/15/2010 DATE OF DISCHARGE:                              OPERATIVE REPORT   PROCEDURE:  Diagnostic colonoscopy.  INDICATIONS FOR PROCEDURE:  A 44-year lady with recent a lower abdominal pain and hematochezia.  CT demonstrated a segment of thickened inflamed- appearing descending colon, also nonspecific liver lesions for which MRI was suggestive for followup.  Since being seen in office her abdominal discomfort totally resolved and she is back to baseline.  No prior colonoscopy.  Colonoscopy is now being done.  Risks, benefits, limitations, alternatives, and imponderables have been discussed, questions answered.  Please see the documentation medical record.  PROCEDURE NOTE:  O2 saturation, blood pressure, pulse, and respirations monitored throughout the entirety of the procedure.  CONSCIOUS SEDATION:  Versed 4 mg IV and Demerol 75 mg IV in divided doses.  INSTRUMENT:  Pentax video chip system.  FINDINGS:  Digital rectal exam revealed no abnormalities.  Endoscopic findings, prep was good.  Colon:  Colonic mucosa was surveyed from the rectosigmoid junction through the left transverse right colon to the appendiceal orifice, ileocecal/cecum.  The structures were well seen and photographed for the record.  From this level, the scope was slowly and cautiously withdrawn.  All previously mentioned mucosal surfaces were again seen.  The colonic mucosa appeared normal.  Scope was pulled down into the rectum.  Thorough examination of the rectal mucosa including retroflex view of the anal verge demonstrated multiple anal papilla only and anal canal hemorrhoid.  The patient tolerated the procedure well. Cecal withdrawal time 6  minutes.  IMPRESSION:  Anal papilla and hemorrhoids, otherwise normal rectum, normal colon.  I suspect she suffered a bout of ischemic or segmental colitis with an self-limiting infectious colitis possible but less likely.  She does not smoke.  RECOMMENDATIONS: 1. No further GI evaluation warranted as far as the colon is concerned     unless she has recurrent symptoms.  We will pursue an MRI of the     liver with followup on the abnormality seen on CT. 2. Consider screening colonoscopy in 10 years.     Jonathon Bellows, MD [facility]     RMR/MEDQ  D:  10/15/2010  T:  10/16/2010  Job:  [id number]  Electronically Signed by Lorrin Goodell M.D. on 11/09/2010 09:20:20 AM

## 2010-11-09 NOTE — Assessment & Plan Note (Signed)
Recent US showed small nodules that do not fit bx criteria and recommended repeat US in 6-12 months.  Reviewed results w/ pt.  Will continue to follow.

## 2010-11-09 NOTE — Assessment & Plan Note (Signed)
Tolerating statin w/out difficulty.  Check labs and adjust meds prn. 

## 2010-11-09 NOTE — Assessment & Plan Note (Signed)
Reviewed MRI and discussed results w/ pt.  Provided reassurance.  Pt feels 'much better' now that she understands the dx.

## 2010-11-12 ENCOUNTER — Encounter: Payer: 59 | Admitting: Gynecology

## 2010-11-15 ENCOUNTER — Other Ambulatory Visit: Payer: Self-pay | Admitting: Family Medicine

## 2010-11-16 NOTE — Telephone Encounter (Signed)
Refill sent.

## 2011-01-27 ENCOUNTER — Other Ambulatory Visit: Payer: Self-pay | Admitting: Family Medicine

## 2011-02-19 ENCOUNTER — Ambulatory Visit: Payer: 59 | Admitting: Family Medicine

## 2011-02-24 ENCOUNTER — Other Ambulatory Visit: Payer: Self-pay | Admitting: Family Medicine

## 2011-02-25 ENCOUNTER — Other Ambulatory Visit: Payer: Self-pay | Admitting: *Deleted

## 2011-02-25 ENCOUNTER — Ambulatory Visit (INDEPENDENT_AMBULATORY_CARE_PROVIDER_SITE_OTHER): Payer: 59 | Admitting: Family Medicine

## 2011-02-25 ENCOUNTER — Encounter: Payer: Self-pay | Admitting: Family Medicine

## 2011-02-25 DIAGNOSIS — I10 Essential (primary) hypertension: Secondary | ICD-10-CM

## 2011-02-25 DIAGNOSIS — E119 Type 2 diabetes mellitus without complications: Secondary | ICD-10-CM

## 2011-02-25 DIAGNOSIS — F341 Dysthymic disorder: Secondary | ICD-10-CM

## 2011-02-25 DIAGNOSIS — F419 Anxiety disorder, unspecified: Secondary | ICD-10-CM | POA: Insufficient documentation

## 2011-02-25 LAB — BASIC METABOLIC PANEL
BUN: 16 mg/dL (ref 6–23)
CO2: 26 mEq/L (ref 19–32)
Calcium: 10.2 mg/dL (ref 8.4–10.5)
Creatinine, Ser: 0.6 mg/dL (ref 0.4–1.2)
Glucose, Bld: 131 mg/dL — ABNORMAL HIGH (ref 70–99)

## 2011-02-25 MED ORDER — GUAIFENESIN-CODEINE 100-10 MG/5ML PO SYRP: 5.0000 mL | ORAL_SOLUTION | ORAL | Status: DC | PRN

## 2011-02-25 MED ORDER — CITALOPRAM HYDROBROMIDE 20 MG PO TABS: 20.0000 mg | ORAL_TABLET | Freq: Every day | ORAL | Status: DC

## 2011-02-25 MED ORDER — SIMVASTATIN 40 MG PO TABS: 40.0000 mg | ORAL_TABLET | Freq: Every day | ORAL | Status: DC

## 2011-02-25 NOTE — Telephone Encounter (Signed)
Manually faxed pharmacy cough syrup with codeine.

## 2011-02-25 NOTE — Progress Notes (Signed)
  Subjective:    Patient ID: Dana Payne, female    DOB: 1965/11/10, 45 y.o.   MRN: [id number]  HPI HTN- chronic problem, excellent control on Lisinopril/HCTZ.  Denies CP, SOB, visual changes, edema.  DM- chronic problem, on Metformin 1000mg .  Admits to poor eating.  No exercise.  No symptomatic lows.  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  Having HAs regularly, not sleeping well.  Pt is asking for time off work- has days to use but her request was denied b/c of the holiday season.  Was told she would need a doctor's note.    Review of Systems For ROS see HPI     Objective:   Physical Exam  Vitals reviewed. Constitutional: She is oriented to person, place, and time. She appears well-developed and well-nourished. No distress.  HENT:  Head: Normocephalic and atraumatic.  Eyes: Conjunctivae and EOM are normal. Pupils are equal, round, and reactive to light.  Neck: Normal range of motion. Neck supple. No thyromegaly present.  Cardiovascular: Normal rate, regular rhythm, normal heart sounds and intact distal pulses.   No murmur heard. Pulmonary/Chest: Effort normal and breath sounds normal. No respiratory distress.  Abdominal: Soft. She exhibits no distension. There is no tenderness.  Musculoskeletal: She exhibits no edema.  Lymphadenopathy:    She has no cervical adenopathy.  Neurological: She is alert and oriented to person, place, and time.  Skin: Skin is warm and dry.  Psychiatric: She has a normal mood and affect. Her behavior is normal.          Assessment & Plan:

## 2011-02-25 NOTE — Telephone Encounter (Signed)
Noted refill on codeine cough syrup was listed as 3. Changed in system with no print to have 0 refills. MD aware of change

## 2011-02-25 NOTE — Patient Instructions (Signed)
Follow up in 1 month to recheck stress levels We'll notify you of your lab results and make any med changes if needed Start the Celexa daily for the anxiety Call with any questions or concerns Hang in there!!!

## 2011-03-02 NOTE — Assessment & Plan Note (Signed)
Pt admits to poor dietary compliance.  Due for labs.  Will adjust meds prn.  Stressed the importance of healthy diet and regular exercise (which would also serve as a good stress outlet).  Will continue to follow closely.

## 2011-03-02 NOTE — Assessment & Plan Note (Signed)
New for pt.  Will start low dose SSRI and see if sxs improve.  Note given for pt to be out the remainder of the week.  Will follow closely.

## 2011-03-02 NOTE — Assessment & Plan Note (Signed)
Chronic problem.  Well controlled on current meds.  Asymptomatic.  No changes. 

## 2011-03-10 ENCOUNTER — Other Ambulatory Visit: Payer: Self-pay | Admitting: Family Medicine

## 2011-03-10 NOTE — Telephone Encounter (Signed)
Dr. Beverely Low patient.    please advise     KP

## 2011-03-11 ENCOUNTER — Other Ambulatory Visit: Payer: Self-pay | Admitting: *Deleted

## 2011-03-11 NOTE — Telephone Encounter (Signed)
Spoke to pt who advised that the pharmacy stated they did not have order for chertussin cough syrup. I advised that the rx was sent on 02-25-11 for 3 refills. I offered to contact pharmacy at [facility] on [address] [place] pt adivsed she would call instead per last time she could not get the medication it was on back order. Pt will call back if any issues arise

## 2011-03-12 ENCOUNTER — Other Ambulatory Visit: Payer: Self-pay | Admitting: *Deleted

## 2011-03-12 NOTE — Telephone Encounter (Signed)
Contacted [facility] [address] to advise a verbal order for cheartussin 10mg /42ml 47ml/Q4 PRN contacted pt to advise the refill is there.

## 2011-03-25 ENCOUNTER — Other Ambulatory Visit: Payer: Self-pay | Admitting: Family Medicine

## 2011-03-26 NOTE — Telephone Encounter (Signed)
rx sent to pharmacy by e-script  

## 2011-04-29 ENCOUNTER — Ambulatory Visit (INDEPENDENT_AMBULATORY_CARE_PROVIDER_SITE_OTHER): Payer: 59 | Admitting: Family Medicine

## 2011-04-29 ENCOUNTER — Encounter: Payer: Self-pay | Admitting: Family Medicine

## 2011-04-29 VITALS — BP 120/70 | HR 97 | Temp 98.8°F | Ht 65.25 in | Wt 172.8 lb

## 2011-04-29 DIAGNOSIS — R05 Cough: Secondary | ICD-10-CM

## 2011-04-29 MED ORDER — OMEPRAZOLE 20 MG PO CPDR: 20.0000 mg | DELAYED_RELEASE_CAPSULE | Freq: Every day | ORAL | Status: DC

## 2011-04-29 NOTE — Patient Instructions (Signed)
Restart the Omeprazole daily If no improvement in the cough in the next 2 weeks- call me and we'll set you up to see Pulmonary Drink plenty of fluids Try and notice if you have a lot of post-nasal drip- this could be contributing Call with any questions or concerns Hang in there! Happy New Year!

## 2011-04-29 NOTE — Progress Notes (Signed)
  Subjective:    Patient ID: Dana Payne, female    DOB: 11/21/1965, 46 y.o.   MRN: [id number]  HPI Cough- reports sxs started in November.  Better than previous.  + PND.  Cough is now productive.  No fevers.  No HAs, facial pressure, ear pain.  No longer on PPI.   Review of Systems For ROS see HPI     Objective:   Physical Exam  Vitals reviewed. Constitutional: She appears well-developed and well-nourished. No distress.  HENT:  Head: Normocephalic and atraumatic.  Right Ear: Tympanic membrane normal.  Left Ear: Tympanic membrane normal.  Nose: Mucosal edema and rhinorrhea present. Right sinus exhibits no maxillary sinus tenderness and no frontal sinus tenderness. Left sinus exhibits no maxillary sinus tenderness and no frontal sinus tenderness.  Mouth/Throat: Mucous membranes are normal. Posterior oropharyngeal erythema (w/ PND) present.  Eyes: Conjunctivae and EOM are normal. Pupils are equal, round, and reactive to light.  Neck: Normal range of motion. Neck supple.  Cardiovascular: Normal rate, regular rhythm and normal heart sounds.   Pulmonary/Chest: Effort normal and breath sounds normal. No respiratory distress. She has no wheezes. She has no rales.       No cough heard  Abdominal: Soft. Bowel sounds are normal. She exhibits no distension. There is no tenderness. There is no rebound and no guarding.  Lymphadenopathy:    She has no cervical adenopathy.          Assessment & Plan:

## 2011-04-30 ENCOUNTER — Other Ambulatory Visit: Payer: Self-pay | Admitting: Family Medicine

## 2011-04-30 MED ORDER — LISINOPRIL-HYDROCHLOROTHIAZIDE 20-12.5 MG PO TABS: 1.0000 | ORAL_TABLET | Freq: Two times a day (BID) | ORAL | Status: DC

## 2011-04-30 NOTE — Telephone Encounter (Signed)
rx sent to pharmacy by e-script  

## 2011-05-09 NOTE — Assessment & Plan Note (Signed)
Likely multifactorial.  Not heard in office today.  No evidence of infxn.  + PND, hx of GERD and no longer on PPI.  Restart Omeprazole.  Encouraged her to continue allergy meds.  If no improvement will refer to pulm.  Pt expressed understanding and is in agreement w/ plan.

## 2011-06-29 ENCOUNTER — Telehealth: Payer: Self-pay | Admitting: Family Medicine

## 2011-06-29 NOTE — Telephone Encounter (Signed)
Pt noted on 04-29-11 OV to contact MD if she still has coughing spell per if so she needs a referral to pulmonary, MD Tabori advised to not send the cough medication per not contacted by pt, request generated by pharmacy

## 2011-06-29 NOTE — Telephone Encounter (Signed)
RX refill for   guaiFENesin-codeine [facility]  [id number] Fax# [phone]

## 2011-07-08 ENCOUNTER — Encounter: Payer: Self-pay | Admitting: Family Medicine

## 2011-07-08 ENCOUNTER — Ambulatory Visit (INDEPENDENT_AMBULATORY_CARE_PROVIDER_SITE_OTHER): Payer: 59 | Admitting: Family Medicine

## 2011-07-08 ENCOUNTER — Telehealth: Payer: Self-pay | Admitting: *Deleted

## 2011-07-08 VITALS — BP 125/80 | HR 86 | Temp 98.3°F | Ht 65.5 in | Wt 170.2 lb

## 2011-07-08 DIAGNOSIS — I10 Essential (primary) hypertension: Secondary | ICD-10-CM

## 2011-07-08 DIAGNOSIS — J209 Acute bronchitis, unspecified: Secondary | ICD-10-CM

## 2011-07-08 DIAGNOSIS — E119 Type 2 diabetes mellitus without complications: Secondary | ICD-10-CM

## 2011-07-08 MED ORDER — AZITHROMYCIN 250 MG PO TABS: ORAL_TABLET | ORAL | Status: AC

## 2011-07-08 MED ORDER — BENZONATATE 200 MG PO CAPS: 200.0000 mg | ORAL_CAPSULE | Freq: Three times a day (TID) | ORAL | Status: AC | PRN

## 2011-07-08 MED ORDER — GUAIFENESIN-CODEINE 100-10 MG/5ML PO SYRP: 10.0000 mL | ORAL_SOLUTION | Freq: Three times a day (TID) | ORAL | Status: DC | PRN

## 2011-07-08 NOTE — Telephone Encounter (Signed)
Pt was seen in office today and wanted a request to be sent to the pharmacy for a new One Touch Ultra lancet Device to be sent in, called [place] (pharmacy tech) she advised that they do not have those in stock, they normally just come with the meter's, noted we do not have the meters in the office, nor the pen, Leanna noted that she will talk to the pt when she comes to the pharmacy to see if she wants her to put an order in for this per her insurance may not pay for it, pt has to go to pharmacy for medications that were sent today for current sxs.

## 2011-07-08 NOTE — Progress Notes (Signed)
  Subjective:    Patient ID: Dana Payne, female    DOB: Feb 04, 1966, 46 y.o.   MRN: [id number]  HPI DM- chronic problem, irregularly checking CBGs, last check was '120 something'.  Denies symptomatic lows, N/V.  On Metformin.  UTD on eye exam.  HTN- chronic problem, well controlled on Lisinopril HCT.  Denies CP, SOB, HAs, visual changes, edema  ? Sinus infxn- sxs started 2 weeks ago.  + nasal congestion, PND, cough- productive.  No facial pain, HAs.  No ear pain.  Low grade temp last week.  + sick contacts.   Review of Systems For ROS see HPI     Objective:   Physical Exam  Vitals reviewed. Constitutional: She appears well-developed and well-nourished. No distress.  HENT:  Head: Normocephalic and atraumatic.       TMs normal bilaterally Mild nasal congestion Throat w/out erythema, edema, or exudate  Eyes: Conjunctivae and EOM are normal. Pupils are equal, round, and reactive to light.  Neck: Normal range of motion. Neck supple.  Cardiovascular: Normal rate, regular rhythm, normal heart sounds and intact distal pulses.   No murmur heard. Pulmonary/Chest: Effort normal and breath sounds normal. No respiratory distress. She has no wheezes.       + hacking cough  Lymphadenopathy:    She has no cervical adenopathy.          Assessment & Plan:

## 2011-07-08 NOTE — Patient Instructions (Signed)
Follow up in 3 months to recheck diabetes and cholesterol We'll notify you of your lab results Start the Zpack for the bronchitis Use the cough pills for daytime, syrup for night (best OTC is Delsym) Drink plenty of fluids REST! Hang in there!!!

## 2011-07-09 ENCOUNTER — Encounter: Payer: Self-pay | Admitting: *Deleted

## 2011-07-09 LAB — BASIC METABOLIC PANEL
BUN: 12 mg/dL (ref 6–23)
Calcium: 9.3 mg/dL (ref 8.4–10.5)
Chloride: 99 mEq/L (ref 96–112)
Creatinine, Ser: 0.7 mg/dL (ref 0.4–1.2)
GFR: 108.35 mL/min (ref >60.00)

## 2011-07-09 LAB — HEMOGLOBIN A1C: Hgb A1c MFr Bld: 6.8 % — ABNORMAL HIGH (ref 4.6–6.5)

## 2011-07-12 ENCOUNTER — Telehealth: Payer: Self-pay | Admitting: *Deleted

## 2011-07-12 NOTE — Telephone Encounter (Signed)
Discuss with patient    A1C has improved from 7.1 --> 6.8. This is good news! No changes at this time!!

## 2011-07-18 NOTE — Assessment & Plan Note (Signed)
Chronic problem.  Well controlled.  Asymptomatic.  No changes. 

## 2011-07-18 NOTE — Assessment & Plan Note (Signed)
Chronic problem.  Usually well controlled.  Overdue on labs.  UTD on eye exam.  Check labs.  Adjust meds prn

## 2011-07-18 NOTE — Assessment & Plan Note (Signed)
New.  Start abx.  Cough meds prn.  Reviewed supportive care and red flags that should prompt return.  Pt expressed understanding and is in agreement w/ plan.  

## 2011-09-30 ENCOUNTER — Other Ambulatory Visit: Payer: Self-pay | Admitting: Family Medicine

## 2011-09-30 MED ORDER — SIMVASTATIN 40 MG PO TABS: 40.0000 mg | ORAL_TABLET | Freq: Every day | ORAL | Status: DC

## 2011-09-30 MED ORDER — METFORMIN HCL ER 500 MG PO TB24: 500.0000 mg | ORAL_TABLET | Freq: Every day | ORAL | Status: DC

## 2011-09-30 NOTE — Telephone Encounter (Signed)
rx sent to pharmacy by e-script Letter has been mailed to pt address noted in the chart to advise they are overdue for cpe/ov/labs and the pt needs to contact office to set up appt   

## 2011-09-30 NOTE — Telephone Encounter (Signed)
Refills x 2  1-Glucophage XR 500MG  Tab Qty 60 Take two tablets by mouth at bedtime  last fill 5.85.13  2-Zocor 40MG  tab Qty 30 Take one tablet by mouth at bedtime  Last ov 3.14.13

## 2011-11-04 ENCOUNTER — Other Ambulatory Visit: Payer: Self-pay | Admitting: Family Medicine

## 2011-11-04 NOTE — Telephone Encounter (Signed)
Please have her schedule CPE

## 2011-11-04 NOTE — Telephone Encounter (Signed)
Please advise if pt needs CPE instead of follow up as was advised at 07-08-11 OV, unable to locate CPE, follow ups instead

## 2011-11-04 NOTE — Telephone Encounter (Signed)
refill Lisinopril-Hydrochlorothiazide (Tab) PRINZIDE,ZESTORETIC 20-12.5 MG Take 1 tablet by mouth 2 (two) times daily  #180, take 1-tablet by m outh twice daily  Last fill 4.11.13, last ov 3.14.13

## 2011-11-05 MED ORDER — LISINOPRIL-HYDROCHLOROTHIAZIDE 20-12.5 MG PO TABS: 1.0000 | ORAL_TABLET | Freq: Two times a day (BID) | ORAL | Status: DC

## 2011-11-05 NOTE — Telephone Encounter (Signed)
Left vm with pt husband to have pt call office to schedule CPE, sent RX to last til apt can be made per MD Tabori CPE scheduled out til 9-13

## 2011-11-11 ENCOUNTER — Encounter: Payer: Self-pay | Admitting: Family Medicine

## 2011-11-11 ENCOUNTER — Other Ambulatory Visit (HOSPITAL_COMMUNITY)
Admission: RE | Admit: 2011-11-11 | Discharge: 2011-11-11 | Disposition: A | Discharge status: Home / Self Care | Payer: 59 | Source: Ambulatory Visit | Attending: Gynecology | Admitting: Gynecology

## 2011-11-11 ENCOUNTER — Encounter: Payer: Self-pay | Admitting: Gynecology

## 2011-11-11 ENCOUNTER — Ambulatory Visit (INDEPENDENT_AMBULATORY_CARE_PROVIDER_SITE_OTHER): Payer: 59 | Admitting: Gynecology

## 2011-11-11 ENCOUNTER — Ambulatory Visit (INDEPENDENT_AMBULATORY_CARE_PROVIDER_SITE_OTHER): Payer: 59 | Admitting: Family Medicine

## 2011-11-11 VITALS — BP 118/76 | Ht 64.5 in | Wt 175.0 lb

## 2011-11-11 VITALS — BP 120/79 | HR 76 | Temp 98.3°F | Ht 65.0 in | Wt 174.0 lb

## 2011-11-11 DIAGNOSIS — E119 Type 2 diabetes mellitus without complications: Secondary | ICD-10-CM

## 2011-11-11 DIAGNOSIS — E785 Hyperlipidemia, unspecified: Secondary | ICD-10-CM

## 2011-11-11 DIAGNOSIS — E049 Nontoxic goiter, unspecified: Secondary | ICD-10-CM

## 2011-11-11 DIAGNOSIS — Z01419 Encounter for gynecological examination (general) (routine) without abnormal findings: Secondary | ICD-10-CM | POA: Insufficient documentation

## 2011-11-11 DIAGNOSIS — Z1151 Encounter for screening for human papillomavirus (HPV): Secondary | ICD-10-CM | POA: Insufficient documentation

## 2011-11-11 DIAGNOSIS — R079 Chest pain, unspecified: Secondary | ICD-10-CM | POA: Insufficient documentation

## 2011-11-11 DIAGNOSIS — R3915 Urgency of urination: Secondary | ICD-10-CM

## 2011-11-11 DIAGNOSIS — I10 Essential (primary) hypertension: Secondary | ICD-10-CM

## 2011-11-11 LAB — CBC WITH DIFFERENTIAL/PLATELET
Basophils Absolute: 0 10*3/uL (ref 0.0–0.1)
Eosinophils Absolute: 0.1 10*3/uL (ref 0.0–0.7)
Hemoglobin: 12.7 g/dL (ref 12.0–15.0)
Lymphocytes Relative: 20.9 % (ref 12.0–46.0)
Lymphs Abs: 1.6 10*3/uL (ref 0.7–4.0)
MCHC: 33.8 g/dL (ref 30.0–36.0)
Neutro Abs: 5.5 10*3/uL (ref 1.4–7.7)
RDW: 13.3 % (ref 11.5–14.6)

## 2011-11-11 LAB — LIPID PANEL
Cholesterol: 132 mg/dL (ref 0–200)
LDL Cholesterol: 65 mg/dL (ref 0–99)
Triglycerides: 108 mg/dL (ref 0.0–149.0)
VLDL: 21.6 mg/dL (ref 0.0–40.0)

## 2011-11-11 LAB — URINALYSIS W MICROSCOPIC + REFLEX CULTURE
Hgb urine dipstick: NEGATIVE
Ketones, ur: NEGATIVE mg/dL
Nitrite: NEGATIVE
pH: 5.5 (ref 5.0–8.0)

## 2011-11-11 LAB — BASIC METABOLIC PANEL
Calcium: 9.6 mg/dL (ref 8.4–10.5)
Chloride: 97 mEq/L (ref 96–112)
Creatinine, Ser: 0.9 mg/dL (ref 0.4–1.2)

## 2011-11-11 LAB — HEPATIC FUNCTION PANEL
Bilirubin, Direct: 0 mg/dL (ref 0.0–0.3)
Total Bilirubin: 0.7 mg/dL (ref 0.3–1.2)

## 2011-11-11 NOTE — Patient Instructions (Addendum)
Exercise to Lose Weight Exercise and a healthy diet may help you lose weight. Your doctor may suggest specific exercises. EXERCISE IDEAS AND TIPS  Choose low-cost things you enjoy doing, such as walking, bicycling, or exercising to workout videos.   Take stairs instead of the elevator.   Walk during your lunch break.   Park your car further away from work or school.   Go to a gym or an exercise class.   Start with 5 to 10 minutes of exercise each day. Build up to 30 minutes of exercise 4 to 6 days a week.   Wear shoes with good support and comfortable clothes.   Stretch before and after working out.   Work out until you breathe harder and your heart beats faster.   Drink extra water when you exercise.   Do not do so much that you hurt yourself, feel dizzy, or get very short of breath.  Exercises that burn about 150 calories:  Running 1  miles in 15 minutes.   Playing volleyball for 45 to 60 minutes.   Washing and waxing a car for 45 to 60 minutes.   Playing touch football for 45 minutes.   Walking 1  miles in 35 minutes.   Pushing a stroller 1  miles in 30 minutes.   Playing basketball for 30 minutes.   Raking leaves for 30 minutes.   Bicycling 5 miles in 30 minutes.   Walking 2 miles in 30 minutes.   Dancing for 30 minutes.   Shoveling snow for 15 minutes.   Swimming laps for 20 minutes.   Walking up stairs for 15 minutes.   Bicycling 4 miles in 15 minutes.   Gardening for 30 to 45 minutes.   Jumping rope for 15 minutes.   Washing windows or floors for 45 to 60 minutes.  Document Released: 05/15/2010 Document Revised: 12/23/2010 Document Reviewed: 05/15/2010 [employer] Patient Information 2012 [employer].                                                  Cholesterol Control Diet  Cholesterol levels in your body are determined significantly by your diet. Cholesterol levels may also be related to heart disease. The following material helps to  explain this relationship and discusses what you can do to help keep your heart healthy. Not all cholesterol is bad. Low-density lipoprotein (LDL) cholesterol is the "bad" cholesterol. It may cause fatty deposits to build up inside your arteries. High-density lipoprotein (HDL) cholesterol is "good." It helps to remove the "bad" LDL cholesterol from your blood. Cholesterol is a very important risk factor for heart disease. Other risk factors are high blood pressure, smoking, stress, heredity, and weight. The heart muscle gets its supply of blood through the coronary arteries. If your LDL cholesterol is high and your HDL cholesterol is low, you are at risk for having fatty deposits build up in your coronary arteries. This leaves less room through which blood can flow. Without sufficient blood and oxygen, the heart muscle cannot function properly and you may feel chest pains (angina pectoris). When a coronary artery closes up entirely, a part of the heart muscle may die, causing a heart attack (myocardial infarction). CHECKING CHOLESTEROL When your caregiver sends your blood to a lab to be analyzed for cholesterol, a complete lipid (fat) profile may be done. With   this test, the total amount of cholesterol and levels of LDL and HDL are determined. Triglycerides are a type of fat that circulates in the blood and can also be used to determine heart disease risk. The list below describes what the numbers should be: Test: Total Cholesterol.  Less than 200 mg/dl.  Test: LDL "bad cholesterol."  Less than 100 mg/dl.   Less than 70 mg/dl if you are at very high risk of a heart attack or sudden cardiac death.  Test: HDL "good cholesterol."  Greater than 50 mg/dl for women.   Greater than 40 mg/dl for men.  Test: Triglycerides.  Less than 150 mg/dl.  CONTROLLING CHOLESTEROL WITH DIET Although exercise and lifestyle factors are important, your diet is key. That is because certain foods are known to raise  cholesterol and others to lower it. The goal is to balance foods for their effect on cholesterol and more importantly, to replace saturated and trans fat with other types of fat, such as monounsaturated fat, polyunsaturated fat, and omega-3 fatty acids. On average, a person should consume no more than 15 to 17 g of saturated fat daily. Saturated and trans fats are considered "bad" fats, and they will raise LDL cholesterol. Saturated fats are primarily found in animal products such as meats, butter, and cream. However, that does not mean you need to sacrifice all your favorite foods. Today, there are good tasting, low-fat, low-cholesterol substitutes for most of the things you like to eat. Choose low-fat or nonfat alternatives. Choose round or loin cuts of red meat, since these types of cuts are lowest in fat and cholesterol. Chicken (without the skin), fish, veal, and ground turkey breast are excellent choices. Eliminate fatty meats, such as hot dogs and salami. Even shellfish have little or no saturated fat. Have a 3 oz (85 g) portion when you eat lean meat, poultry, or fish. Trans fats are also called "partially hydrogenated oils." They are oils that have been scientifically manipulated so that they are solid at room temperature resulting in a longer shelf life and improved taste and texture of foods in which they are added. Trans fats are found in stick margarine, some tub margarines, cookies, crackers, and baked goods.  When baking and cooking, oils are an excellent substitute for butter. The monounsaturated oils are especially beneficial since it is believed they lower LDL and raise HDL. The oils you should avoid entirely are saturated tropical oils, such as coconut and palm.  Remember to eat liberally from food groups that are naturally free of saturated and trans fat, including fish, fruit, vegetables, beans, grains (barley, rice, couscous, bulgur wheat), and pasta (without cream sauces).  IDENTIFYING  FOODS THAT LOWER CHOLESTEROL  Soluble fiber may lower your cholesterol. This type of fiber is found in fruits such as apples, vegetables such as broccoli, potatoes, and carrots, legumes such as beans, peas, and lentils, and grains such as barley. Foods fortified with plant sterols (phytosterol) may also lower cholesterol. You should eat at least 2 g per day of these foods for a cholesterol lowering effect.  Read package labels to identify low-saturated fats, trans fats free, and low-fat foods at the supermarket. Select cheeses that have only 2 to 3 g saturated fat per ounce. Use a heart-healthy tub margarine that is free of trans fats or partially hydrogenated oil. When buying baked goods (cookies, crackers), avoid partially hydrogenated oils. Breads and muffins should be made from whole grains (whole-wheat or whole oat flour, instead of "flour" or "  enriched flour"). Buy non-creamy canned soups with reduced salt and no added fats.  FOOD PREPARATION TECHNIQUES  Never deep-fry. If you must fry, either stir-fry, which uses very little fat, or use non-stick cooking sprays. When possible, broil, bake, or roast meats, and steam vegetables. Instead of dressing vegetables with butter or margarine, use lemon and herbs, applesauce and cinnamon (for squash and sweet potatoes), nonfat yogurt, salsa, and low-fat dressings for salads.  LOW-SATURATED FAT / LOW-FAT FOOD SUBSTITUTES Meats / Saturated Fat (g)  Avoid: Steak, marbled (3 oz/85 g) / 11 g   Choose: Steak, lean (3 oz/85 g) / 4 g   Avoid: Hamburger (3 oz/85 g) / 7 g   Choose: Hamburger, lean (3 oz/85 g) / 5 g   Avoid: Ham (3 oz/85 g) / 6 g   Choose: Ham, lean cut (3 oz/85 g) / 2.4 g   Avoid: Chicken, with skin, dark meat (3 oz/85 g) / 4 g   Choose: Chicken, skin removed, dark meat (3 oz/85 g) / 2 g   Avoid: Chicken, with skin, light meat (3 oz/85 g) / 2.5 g   Choose: Chicken, skin removed, light meat (3 oz/85 g) / 1 g  Dairy / Saturated Fat  (g)  Avoid: Whole milk (1 cup) / 5 g   Choose: Low-fat milk, 2% (1 cup) / 3 g   Choose: Low-fat milk, 1% (1 cup) / 1.5 g   Choose: Skim milk (1 cup) / 0.3 g   Avoid: Hard cheese (1 oz/28 g) / 6 g   Choose: Skim milk cheese (1 oz/28 g) / 2 to 3 g   Avoid: Cottage cheese, 4% fat (1 cup) / 6.5 g   Choose: Low-fat cottage cheese, 1% fat (1 cup) / 1.5 g   Avoid: Ice cream (1 cup) / 9 g   Choose: Sherbet (1 cup) / 2.5 g   Choose: Nonfat frozen yogurt (1 cup) / 0.3 g   Choose: Frozen fruit bar / trace   Avoid: Whipped cream (1 tbs) / 3.5 g   Choose: Nondairy whipped topping (1 tbs) / 1 g  Condiments / Saturated Fat (g)  Avoid: Mayonnaise (1 tbs) / 2 g   Choose: Low-fat mayonnaise (1 tbs) / 1 g   Avoid: Butter (1 tbs) / 7 g   Choose: Extra light margarine (1 tbs) / 1 g   Avoid: Coconut oil (1 tbs) / 11.8 g   Choose: Olive oil (1 tbs) / 1.8 g   Choose: Corn oil (1 tbs) / 1.7 g   Choose: Safflower oil (1 tbs) / 1.2 g   Choose: Sunflower oil (1 tbs) / 1.4 g   Choose: Soybean oil (1 tbs) / 2.4 g   Choose: Canola oil (1 tbs) / 1 g  Document Released: 04/12/2005 Document Revised: 12/23/2010 Document Reviewed: 10/01/2010 [facility] Patient Information 2012 [place], [place].  Hormone Therapy At menopause, your body begins making less estrogen and progesterone hormones. This causes the body to stop having menstrual periods. This is because estrogen and progesterone hormones control your periods and menstrual cycle. A lack of estrogen may cause symptoms such as:  Hot flushes (or hot flashes).   Vaginal dryness.   Dry skin.   Loss of sex drive.   Risk of bone loss (osteoporosis).  When this happens, you may choose to take hormone therapy to get back the estrogen lost during menopause. When the hormone estrogen is given alone, it is usually referred to as ET (Estrogen Therapy).  When the hormone progestin is combined with estrogen, it is generally called HT (Hormone  Therapy). This was formerly known as hormone replacement therapy (HRT). Your caregiver can help you make a decision on what will be best for you. The decision to use HT seems to change often as new studies are done. Many studies do not agree on the benefits of hormone replacement therapy. LIKELY BENEFITS OF HT INCLUDE PROTECTION FROM:  Hot Flushes (also called hot flashes) - A hot flush is a sudden feeling of heat that spreads over the face and body. The skin may redden like a blush. It is connected with sweats and sleep disturbance. Women going through menopause may have hot flushes a few times a month or several times per day depending on the woman.   Osteoporosis (bone loss)- Estrogen helps guard against bone loss. After menopause, a woman's bones slowly lose calcium and become weak and brittle. As a result, bones are more likely to break. The hip, wrist, and spine are affected most often. Hormone therapy can help slow bone loss after menopause. Weight bearing exercise and taking calcium with vitamin D also can help prevent bone loss. There are also medications that your caregiver can prescribe that can help prevent osteoporosis.   Vaginal Dryness - Loss of estrogen causes changes in the vagina. Its lining may become thin and dry. These changes can cause pain and bleeding during sexual intercourse. Dryness can also lead to infections. This can cause burning and itching. (Vaginal estrogen treatment can help relieve pain, itching, and dryness.)   Urinary Tract Infections are more common after menopause because of lack of estrogen. Some women also develop urinary incontinence because of low estrogen levels in the vagina and bladder.   Possible other benefits of estrogen include a positive effect on mood and short-term memory in women.  RISKS AND COMPLICATIONS  Using estrogen alone without progesterone causes the lining of the uterus to grow. This increases the risk of lining of the uterus (endometrial)  cancer. Your caregiver should give another hormone called progestin if you have a uterus.   Women who take combined (estrogen and progestin) HT appear to have an increased risk of breast cancer. The risk appears to be small, but increases throughout the time that HT is taken.   Combined therapy also makes the breast tissue slightly denser which makes it harder to read mammograms (breast X-rays).   Combined, estrogen and progesterone therapy can be taken together every day, in which case there may be spotting of blood. HT therapy can be taken cyclically in which case you will have menstrual periods. Cyclically means HT is taken for a set amount of days, then not taken, then this process is repeated.   HT may increase the risk of stroke, heart attack, breast cancer and forming blood clots in your leg.   Transdermal estrogen (estrogen that is absorbed through the skin with a patch or a cream) may have more positive results with:   Cholesterol.   Blood pressure.   Blood clots.  Having the following conditions may indicate you should not have HT:  Endometrial cancer.   Liver disease.   Breast cancer.   Heart disease.   History of blood clots.   Stroke.  TREATMENT   If you choose to take HT and have a uterus, usually estrogen and progestin are prescribed.   Your caregiver will help you decide the best way to take the medications.   Possible ways to take estrogen include:  Pills.   Patches.   Gels.   Sprays.   Vaginal estrogen cream, rings and tablets.   It is best to take the lowest dose possible that will help your symptoms and take them for the shortest period of time that you can.   Hormone therapy can help relieve some of the problems (symptoms) that affect women at menopause. Before making a decision about HT, talk to your caregiver about what is best for you. Be well informed and comfortable with your decisions.  HOME CARE INSTRUCTIONS   Follow your caregivers  advice when taking the medications.   A Pap test is done to screen for cervical cancer.   The first Pap test should be done at age 36.   Between ages 20 and 76, Pap tests are repeated every 2 years.   Beginning at age 77, you are advised to have a Pap test every 3 years as long as your past 3 Pap tests have been normal.   Some women have medical problems that increase the chance of getting cervical cancer. Talk to your caregiver about these problems. It is especially important to talk to your caregiver if a new problem develops soon after your last Pap test. In these cases, your caregiver may recommend more frequent screening and Pap tests.   The above recommendations are the same for women who have or have not gotten the vaccine for HPV (Human Papillomavirus).   If you had a hysterectomy for a problem that was not a cancer or a condition that could lead to cancer, then you no longer need Pap tests. However, even if you no longer need a Pap test, a regular exam is a good idea to make sure no other problems are starting.    If you are between ages 3 and 67, and you have had normal Pap tests going back 10 years, you no longer need Pap tests. However, even if you no longer need a Pap test, a regular exam is a good idea to make sure no other problems are starting.    If you have had past treatment for cervical cancer or a condition that could lead to cancer, you need Pap tests and screening for cancer for at least 20 years after your treatment.   If Pap tests have been discontinued, risk factors (such as a new sexual partner) need to be re-assessed to determine if screening should be resumed.   Some women may need screenings more often if they are at high risk for cervical cancer.   Get mammograms done as per the advice of your caregiver.  SEEK IMMEDIATE MEDICAL CARE IF:  You develop abnormal vaginal bleeding.   You have pain or swelling in your legs, shortness of breath, or chest  pain.   You develop dizziness or headaches.   You have lumps or changes in your breasts or armpits.   You have slurred speech.   You develop weakness or numbness of your arms or legs.   You have pain, burning, or bleeding when urinating.   You develop abdominal pain.  Document Released: 01/09/2003 Document Revised: 04/01/2011 Document Reviewed: 04/29/2010 [facility] Patient Information 2012 [place], [place].Menopause Menopause is the normal time of life when menstrual periods stop completely. Menopause is complete when you have missed 12 consecutive menstrual periods. It usually occurs between the ages of 71 to 74, with an average age of 2. Very rarely does a woman develop menopause before 47 years old. At menopause, your ovaries stop producing  the female hormones, estrogen and progesterone. This can cause undesirable symptoms and also affect your health. Sometimes the symptoms may occur 4 to 5 years before the menopause begins. There is no relationship between menopause and:  Oral contraceptives.   Number of children you had.   Race.   The age your menstrual periods started (menarche).  Heavy smokers and very thin women may develop menopause earlier in life. CAUSES  The ovaries stop producing the female hormones estrogen and progesterone.   Other causes include:   Surgery to remove both ovaries.   The ovaries stop functioning for no known reason.   Tumors of the pituitary gland in the brain.   Medical disease that affects the ovaries and hormone production.   Radiation treatment to the abdomen or pelvis.   Chemotherapy that affects the ovaries.  SYMPTOMS   Hot flashes.   Night sweats.   Decrease in sex drive.   Vaginal dryness and thinning of the vagina causing painful intercourse.   Dryness of the skin and developing wrinkles.   Headaches.   Tiredness.   Irritability.   Memory problems.   Weight gain.   Bladder infections.   Hair growth of the face  and chest.   Infertility.  More serious symptoms include:  Loss of bone (osteoporosis) causing breaks (fractures).   Depression.   Hardening and narrowing of the arteries (atherosclerosis) causing heart attacks and strokes.  DIAGNOSIS   When the menstrual periods have stopped for 12 straight months.   Physical exam.   Hormone studies of the blood.  TREATMENT  There are many treatment choices and nearly as many questions about them. The decisions to treat or not to treat menopausal changes is an individual choice made with your caregiver. Your caregiver can discuss the treatments with you. Together, you can decide which treatment will work best for you. Your treatment choices may include:   Hormone therapy (estorgen and progesterone).   Non-hormonal medications.   Treating the individual symptoms with medication (for example antidepressants for depression).   Herbal medications that may help specific symptoms.   Counseling by a psychiatrist or psychologist.   Group therapy.   Lifestyle changes including:   Eating healthy.   Regular exercise.   Limiting caffeine and alcohol.   Stress management and meditation.   No treatment.  HOME CARE INSTRUCTIONS   Take the medication your caregiver gives you as directed.   Get plenty of sleep and rest.   Exercise regularly.   Eat a diet that contains calcium (good for the bones) and soy products (acts like estrogen hormone).   Avoid alcoholic beverages.   Do not smoke.   If you have hot flashes, dress in layers.   Take supplements, calcium and vitamin D to strengthen bones.   You can use over-the-counter lubricants or moisturizers for vaginal dryness.   Group therapy is sometimes very helpful.   Acupuncture may be helpful in some cases.  SEEK MEDICAL CARE IF:   You are not sure you are in menopause.   You are having menopausal symptoms and need advice and treatment.   You are still having menstrual periods  after age 69.   You have pain with intercourse.   Menopause is complete (no menstrual period for 12 months) and you develop vaginal bleeding.   You need a referral to a specialist (gynecologist, psychiatrist or psychologist) for treatment.  SEEK IMMEDIATE MEDICAL CARE IF:   You have severe depression.   You have excessive vaginal  bleeding.   You fell and think you have a broken bone.   You have pain when you urinate.   You develop leg or chest pain.   You have a fast pounding heart beat (palpitations).   You have severe headaches.   You develop vision problems.   You feel a lump in your breast.   You have abdominal pain or severe indigestion.  Document Released: 07/03/2003 Document Revised: 04/01/2011 Document Reviewed: 02/08/2008 [facility] Patient Information 2012 [place], [place].Health Maintenance, Females A healthy lifestyle and preventative care can promote health and wellness.  Maintain regular health, dental, and eye exams.   Eat a healthy diet. Foods like vegetables, fruits, whole grains, low-fat dairy products, and lean protein foods contain the nutrients you need without too many calories. Decrease your intake of foods high in solid fats, added sugars, and salt. Get information about a proper diet from your caregiver, if necessary.   Regular physical exercise is one of the most important things you can do for your health. Most adults should get at least 150 minutes of moderate-intensity exercise (any activity that increases your heart rate and causes you to sweat) each week. In addition, most adults need muscle-strengthening exercises on 2 or more days a week.    Maintain a healthy weight. The body mass index (BMI) is a screening tool to identify possible weight problems. It provides an estimate of body fat based on height and weight. Your caregiver can help determine your BMI, and can help you achieve or maintain a healthy weight. For adults 20 years and older:   A  BMI below 18.5 is considered underweight.   A BMI of 18.5 to 24.9 is normal.   A BMI of 25 to 29.9 is considered overweight.   A BMI of 30 and above is considered obese.   Maintain normal blood lipids and cholesterol by exercising and minimizing your intake of saturated fat. Eat a balanced diet with plenty of fruits and vegetables. Blood tests for lipids and cholesterol should begin at age 73 and be repeated every 5 years. If your lipid or cholesterol levels are high, you are over 50, or you are a high risk for heart disease, you may need your cholesterol levels checked more frequently.Ongoing high lipid and cholesterol levels should be treated with medicines if diet and exercise are not effective.   If you smoke, find out from your caregiver how to quit. If you do not use tobacco, do not start.   If you are pregnant, do not drink alcohol. If you are breastfeeding, be very cautious about drinking alcohol. If you are not pregnant and choose to drink alcohol, do not exceed 1 drink per day. One drink is considered to be 12 ounces (355 mL) of beer, 5 ounces (148 mL) of wine, or 1.5 ounces (44 mL) of liquor.   Avoid use of street drugs. Do not share needles with anyone. Ask for help if you need support or instructions about stopping the use of drugs.   High blood pressure causes heart disease and increases the risk of stroke. Blood pressure should be checked at least every 1 to 2 years. Ongoing high blood pressure should be treated with medicines, if weight loss and exercise are not effective.   If you are 70 to 47 years old, ask your caregiver if you should take aspirin to prevent strokes.   Diabetes screening involves taking a blood sample to check your fasting blood sugar level. This should be done once  every 3 years, after age 39, if you are within normal weight and without risk factors for diabetes. Testing should be considered at a younger age or be carried out more frequently if you are  overweight and have at least 1 risk factor for diabetes.   Breast cancer screening is essential preventative care for women. You should practice "breast self-awareness." This means understanding the normal appearance and feel of your breasts and may include breast self-examination. Any changes detected, no matter how small, should be reported to a caregiver. Women in their 29s and 30s should have a clinical breast exam (CBE) by a caregiver as part of a regular health exam every 1 to 3 years. After age 79, women should have a CBE every year. Starting at age 77, women should consider having a mammogram (breast X-ray) every year. Women who have a family history of breast cancer should talk to their caregiver about genetic screening. Women at a high risk of breast cancer should talk to their caregiver about having an MRI and a mammogram every year.   The Pap test is a screening test for cervical cancer. Women should have a Pap test starting at age 61. Between ages 52 and 53, Pap tests should be repeated every 2 years. Beginning at age 87, you should have a Pap test every 3 years as long as the past 3 Pap tests have been normal. If you had a hysterectomy for a problem that was not cancer or a condition that could lead to cancer, then you no longer need Pap tests. If you are between ages 50 and 32, and you have had normal Pap tests going back 10 years, you no longer need Pap tests. If you have had past treatment for cervical cancer or a condition that could lead to cancer, you need Pap tests and screening for cancer for at least 20 years after your treatment. If Pap tests have been discontinued, risk factors (such as a new sexual partner) need to be reassessed to determine if screening should be resumed. Some women have medical problems that increase the chance of getting cervical cancer. In these cases, your caregiver may recommend more frequent screening and Pap tests.   The human papillomavirus (HPV) test is an  additional test that may be used for cervical cancer screening. The HPV test looks for the virus that can cause the cell changes on the cervix. The cells collected during the Pap test can be tested for HPV. The HPV test could be used to screen women aged 9 years and older, and should be used in women of any age who have unclear Pap test results. After the age of 40, women should have HPV testing at the same frequency as a Pap test.   Colorectal cancer can be detected and often prevented. Most routine colorectal cancer screening begins at the age of 13 and continues through age 65. However, your caregiver may recommend screening at an earlier age if you have risk factors for colon cancer. On a yearly basis, your caregiver may provide home test kits to check for hidden blood in the stool. Use of a small camera at the end of a tube, to directly examine the colon (sigmoidoscopy or colonoscopy), can detect the earliest forms of colorectal cancer. Talk to your caregiver about this at age 89, when routine screening begins. Direct examination of the colon should be repeated every 5 to 10 years through age 77, unless early forms of pre-cancerous polyps or small growths  are found.   Hepatitis C blood testing is recommended for all people born from [birth year] through 1966 and any individual with known risks for hepatitis C.   Practice safe sex. Use condoms and avoid high-risk sexual practices to reduce the spread of sexually transmitted infections (STIs). Sexually active women aged 16 and younger should be checked for Chlamydia, which is a common sexually transmitted infection. Older women with new or multiple partners should also be tested for Chlamydia. Testing for other STIs is recommended if you are sexually active and at increased risk.   Osteoporosis is a disease in which the bones lose minerals and strength with aging. This can result in serious bone fractures. The risk of osteoporosis can be identified using a bone  density scan. Women ages 54 and over and women at risk for fractures or osteoporosis should discuss screening with their caregivers. Ask your caregiver whether you should be taking a calcium supplement or vitamin D to reduce the rate of osteoporosis.   Menopause can be associated with physical symptoms and risks. Hormone replacement therapy is available to decrease symptoms and risks. You should talk to your caregiver about whether hormone replacement therapy is right for you.   Use sunscreen with a sun protection factor (SPF) of 30 or greater. Apply sunscreen liberally and repeatedly throughout the day. You should seek shade when your shadow is shorter than you. Protect yourself by wearing long sleeves, pants, a wide-brimmed hat, and sunglasses year round, whenever you are outdoors.   Notify your caregiver of new moles or changes in moles, especially if there is a change in shape or color. Also notify your caregiver if a mole is larger than the size of a pencil eraser.   Stay current with your immunizations.  Document Released: 10/26/2010 Document Revised: 04/01/2011 Document Reviewed: 10/26/2010 [facility] Patient Information 2012 [place], [place].

## 2011-11-11 NOTE — Progress Notes (Signed)
Dana Payne Apr 17, 1966 [id number]   History:    46 y.o.  for annual gyn exam who is only complaint today with urinary frequency no dysuria some occasional low back discomfort could be from straining lifting at work. She denied fever chills nausea or vomiting or any vaginal discharge. She is being followed by Dr. Beverely Low who is monitoingr her hypertension, hypercholesterolemia, and type 2 diabetes. She scheduled to see her later today for blood work so no labs will be drawn today. Patient with prior history of total abdominal hysterectomy. Patient last year was noted to have an elevated FSH indicative of her being in the menopause. Her symptoms are very few if any and she is not interested in hormone replacement therapy at the present time. Her last Pap smear was normal in 2012. She had a past history of CO2 laser of her cervix by another provider many years ago and I do not have the grade of dysplasia report but her Pap smears have been normal. Review of her record again she was weighing 167 is up to 175. Her last mammogram was in 2012 and she does her monthly self breast examination. Patient does consume large quantities of diet sodas and tea,  Past medical history,surgical history, family history and social history were all reviewed and documented in the EPIC chart.  Gynecologic History No LMP recorded. Patient has had a hysterectomy. Contraception: Prior hysterectomy Last Pap: 2012. Results were: normal Last mammogram: 2012. Results were: normal  Obstetric History OB History    Grav Para Term Preterm Abortions TAB SAB Ect Mult Living   0                ROS: A ROS was performed and pertinent positives and negatives are included in the history.  GENERAL: No fevers or chills. HEENT: No change in vision, no earache, sore throat or sinus congestion. NECK: No pain or stiffness. CARDIOVASCULAR: No chest pain or pressure. No palpitations. PULMONARY: No shortness of breath, cough or wheeze.  GASTROINTESTINAL: No abdominal pain, nausea, vomiting or diarrhea, melena or bright red blood per rectum. GENITOURINARY: No urinary frequency, urgency, hesitancy or dysuria. MUSCULOSKELETAL: No joint or muscle pain, no back pain, no recent trauma. DERMATOLOGIC: No rash, no itching, no lesions. ENDOCRINE: No polyuria, polydipsia, no heat or cold intolerance. No recent change in weight. HEMATOLOGICAL: No anemia or easy bruising or bleeding. NEUROLOGIC: No headache, seizures, numbness, tingling or weakness. PSYCHIATRIC: No depression, no loss of interest in normal activity or change in sleep pattern.     Exam: chaperone present  BP 118/76  Ht 5' 4.5" (1.638 m)  Wt 175 lb (79.379 kg)  BMI 29.57 kg/m2  Body mass index is 29.57 kg/(m^2).  General appearance : Well developed well nourished female. No acute distress HEENT: Neck supple, trachea midline, no carotid bruits, no thyroidmegaly Lungs: Clear to auscultation, no rhonchi or wheezes, or rib retractions  Heart: Regular rate and rhythm, no murmurs or gallops Breast:Examined in sitting and supine position were symmetrical in appearance, no palpable masses or tenderness,  no skin retraction, no nipple inversion, no nipple discharge, no skin discoloration, no axillary or supraclavicular lymphadenopathy Abdomen: no palpable masses or tenderness, no rebound or guarding Extremities: no edema or skin discoloration or tenderness  Pelvic:  Bartholin, Urethra, Skene Glands: Within normal limits             Vagina: No gross lesions or discharge  Cervix: Absent  Uterus  Absent  Adnexa  Without masses or tenderness  Anus and perineum  normal   Rectovaginal  normal sphincter tone without palpated masses or tenderness             Hemoccult not done     Assessment/Plan:  46 y.o. female for annual exam with history of urinary frequency. She is a type II diabetic and consumes  large quantities of diet drinks and ice tea. Her urinalysis was negative. She  will followup with Dr. Beverely Low later this morning for her lab work. Her Pap smear was done today despite the new screening guidelines since we do not have a handle on the degree of dysplasia that she had in the past whereby she had CO2 laser of her cervix. She has had a hysterectomy in the past and have been normal. We will continue to follow for a few more years. She'll be scheduling her mammogram. We discussed importance of regular exercise and diet for which literature information was provided. We discussed importance of calcium and vitamin D for osteoporosis prevention as well.    Ok Edwards MD, 10:33 AM 11/11/2011

## 2011-11-11 NOTE — Progress Notes (Signed)
  Subjective:    Patient ID: Dana Payne, female    DOB: July 19, 1965, 46 y.o.   MRN: [id number]  HPI DM- chronic problem.  On Metformin.  CBG this AM 148.  'i've been cheating a lot'.  Has gained weight.  Not exercising.  Denies symptomatic lows.  Due for eye exam next month.    Hyperlipidemia- chronic problem, on Zocor 40mg .  Due for labs.  Denies N/V, abd pain, myalgias.  HTN- chronic problem, well controlled on Lisinopril HCTZ.  + CP- described as a 'grabbing in the chest'.  Noticed in last few weeks.  Occurs intermittently.  Pain is sharp and quick.  Increased stress recently, enjoys spicy foods.  Resolves spontaneously.  Nothing makes pain better or worse.  No obvious relation to food.  No SOB, N/V.  No HAs, visual changes, edema.  Has been under a lot of stress.  + gas/bloating.   Review of Systems For ROS see HPI     Objective:   Physical Exam  Vitals reviewed. Constitutional: She is oriented to person, place, and time. She appears well-developed and well-nourished. No distress.  HENT:  Head: Normocephalic and atraumatic.  Eyes: Conjunctivae and EOM are normal. Pupils are equal, round, and reactive to light.  Neck: Normal range of motion. Neck supple. Thyromegaly present.  Cardiovascular: Normal rate, regular rhythm, normal heart sounds and intact distal pulses.   No murmur heard. Pulmonary/Chest: Effort normal and breath sounds normal. No respiratory distress.  Abdominal: Soft. She exhibits no distension. There is no tenderness.  Musculoskeletal: She exhibits no edema.  Lymphadenopathy:    She has no cervical adenopathy.  Neurological: She is alert and oriented to person, place, and time.  Skin: Skin is warm and dry.  Psychiatric: She has a normal mood and affect. Her behavior is normal.          Assessment & Plan:

## 2011-11-11 NOTE — Assessment & Plan Note (Signed)
Pt had nodules on [place] last year that recommended repeat scan in 1 year.  Order placed.

## 2011-11-11 NOTE — Assessment & Plan Note (Signed)
Chronic problem.  Well controlled today.  Check labs.  No anticipated changes.

## 2011-11-11 NOTE — Assessment & Plan Note (Signed)
Chronic problem.  Tolerating statin w/out difficulty.  Check labs.  Adjust meds prn  

## 2011-11-11 NOTE — Patient Instructions (Addendum)
Follow up in 3-4 months to recheck diabetes Your EKG looks great!  Your chest pain is most likely heartburn or gas.  Try Prilosec or Gas-X as needed Try and focus on healthy diet and regular exercise We'll notify you of your lab results and make any changes if needed Someone will call you about your thyroid US Call with any questions or concerns Happy Early [place] Ouch!

## 2011-11-11 NOTE — Assessment & Plan Note (Signed)
New.  Suspect this is GERD or gas.  No concerning features on hx, PE WNL, EKG WNL.  Reviewed supportive care and red flags that should prompt return.  Pt expressed understanding and is in agreement w/ plan.

## 2011-11-11 NOTE — Assessment & Plan Note (Signed)
Chronic problem.  Pt admits to increased eating recently w/ stress.  Not exercising.  CBGs slightly higher than usual.  Has eye exam next month.  Check labs.  Adjust meds prn

## 2011-11-15 ENCOUNTER — Other Ambulatory Visit (HOSPITAL_COMMUNITY): Payer: 59

## 2011-11-29 ENCOUNTER — Telehealth: Payer: Self-pay | Admitting: Family Medicine

## 2011-11-29 MED ORDER — METFORMIN HCL ER 500 MG PO TB24: 500.0000 mg | ORAL_TABLET | Freq: Every day | ORAL | Status: DC

## 2011-11-29 NOTE — Telephone Encounter (Signed)
Refill: Metformin er 500mg  tab. Take one tablet by mouth every day with breakfast. Qty 60. Last fill 10-29-11

## 2011-11-29 NOTE — Telephone Encounter (Signed)
rx sent to pharmacy by e-script  

## 2011-12-31 ENCOUNTER — Other Ambulatory Visit: Payer: Self-pay | Admitting: Family Medicine

## 2011-12-31 MED ORDER — SIMVASTATIN 40 MG PO TABS: 40.0000 mg | ORAL_TABLET | Freq: Every day | ORAL | Status: DC

## 2011-12-31 NOTE — Telephone Encounter (Signed)
refill zocor 40mg  tab #30 take one tablet by mouth every day at bedtime--last fill 8.3.13 Last ov 7.18.13 follow up DM

## 2011-12-31 NOTE — Telephone Encounter (Signed)
rx sent to pharmacy by e-script  

## 2012-01-05 ENCOUNTER — Other Ambulatory Visit: Payer: Self-pay | Admitting: Family Medicine

## 2012-01-05 NOTE — Telephone Encounter (Signed)
Last ov 7.18.13 follow up DM  Refill cheratussin ac syp, take two teaspoonsful by mouth three times daily as needed for cough, last fill 3.14.13

## 2012-01-06 NOTE — Telephone Encounter (Signed)
.  left message to have patient return my call to clarify if she is having sxs that she will need a refill on this RX per last refill noted 07-08-11 for no refills Last OV 11-11-11 for follow up and heartburn

## 2012-01-07 NOTE — Telephone Encounter (Signed)
Mess on triage line at 950am returned call  [phone]

## 2012-01-07 NOTE — Telephone Encounter (Signed)
LMOVM for pt to return call 

## 2012-01-11 NOTE — Telephone Encounter (Signed)
.  left message to have patient return my call on home phone, and fiance gave pt work number [phone], advised pt the refill request received and noted this medication refill is not given without a recent OV, pt advised that she normally gets refills from [facility] per notes during the allergy season she always gets a terrible cough, also due to seasons changing, pt requested that this nurse ask [facility] for refill please, last OV 11-11-11 follow up

## 2012-01-11 NOTE — Telephone Encounter (Signed)
Pt should try OTC delsym and mucinex prior to using codeine based cough syrup.  Make sure she is taking claritin or zyrtec for seasonal allergies

## 2012-01-13 NOTE — Telephone Encounter (Signed)
.  left message to have patient return my call on pt home phone, called pt work and was advised that she is not in today

## 2012-01-14 NOTE — Telephone Encounter (Signed)
Left msg for pt to call. 

## 2012-01-18 NOTE — Telephone Encounter (Signed)
.  left message to have patient return my call.  

## 2012-01-20 NOTE — Telephone Encounter (Signed)
.  left message to have patient return my call.  

## 2012-02-08 ENCOUNTER — Other Ambulatory Visit: Payer: Self-pay | Admitting: Family Medicine

## 2012-02-08 MED ORDER — LISINOPRIL-HYDROCHLOROTHIAZIDE 20-12.5 MG PO TABS: 1.0000 | ORAL_TABLET | Freq: Two times a day (BID) | ORAL | Status: DC

## 2012-02-08 NOTE — Telephone Encounter (Signed)
rx sent to pharmacy by e-script  

## 2012-02-08 NOTE — Telephone Encounter (Signed)
Refill Lisino-HCTZ (Tab) 20-12.5 MG Take 1 tablet by mouth 2 (two) times daily. #180 last fill 7.12.13, last ov 7.18.13 Follow up

## 2012-02-11 ENCOUNTER — Encounter: Payer: Self-pay | Admitting: Gynecology

## 2012-02-21 ENCOUNTER — Other Ambulatory Visit: Payer: Self-pay | Admitting: Family Medicine

## 2012-02-23 ENCOUNTER — Telehealth: Payer: Self-pay | Admitting: Family Medicine

## 2012-02-23 DIAGNOSIS — E049 Nontoxic goiter, unspecified: Secondary | ICD-10-CM

## 2012-02-23 NOTE — Telephone Encounter (Signed)
Patient called stating she still wants to have ultrasound done. I spoke w/Rene and she advised that we will need a new order for the patient to be able to reschedule her appt.

## 2012-02-23 NOTE — Telephone Encounter (Signed)
Order placed in chart, please advise

## 2012-02-23 NOTE — Telephone Encounter (Signed)
Ok to reschedule

## 2012-02-23 NOTE — Telephone Encounter (Signed)
Please advise if ok to reschedule?

## 2012-02-29 NOTE — Telephone Encounter (Signed)
Please note the order was placed for the [place]

## 2012-02-29 NOTE — Telephone Encounter (Signed)
Patient is already rescheduled for this ultrasound and she is aware.

## 2012-03-01 ENCOUNTER — Telehealth: Payer: Self-pay | Admitting: Family Medicine

## 2012-03-01 NOTE — Telephone Encounter (Signed)
[facility] called. They need an order Faxed to [phone] for the patient's thyroid US.

## 2012-03-01 NOTE — Telephone Encounter (Signed)
Faxed the requisition again.

## 2012-03-02 ENCOUNTER — Ambulatory Visit: Payer: 59 | Admitting: Family Medicine

## 2012-03-02 ENCOUNTER — Ambulatory Visit (HOSPITAL_COMMUNITY)
Admission: RE | Admit: 2012-03-02 | Discharge: 2012-03-02 | Disposition: A | Discharge status: Home / Self Care | Payer: 59 | Source: Ambulatory Visit | Attending: Family Medicine | Admitting: Family Medicine

## 2012-03-02 DIAGNOSIS — E049 Nontoxic goiter, unspecified: Secondary | ICD-10-CM | POA: Insufficient documentation

## 2012-03-09 ENCOUNTER — Other Ambulatory Visit: Payer: Self-pay | Admitting: *Deleted

## 2012-03-09 ENCOUNTER — Ambulatory Visit (INDEPENDENT_AMBULATORY_CARE_PROVIDER_SITE_OTHER): Payer: 59 | Admitting: Family Medicine

## 2012-03-09 VITALS — BP 90/50 | HR 87 | Temp 98.3°F | Wt 173.0 lb

## 2012-03-09 DIAGNOSIS — E119 Type 2 diabetes mellitus without complications: Secondary | ICD-10-CM

## 2012-03-09 DIAGNOSIS — I10 Essential (primary) hypertension: Secondary | ICD-10-CM

## 2012-03-09 DIAGNOSIS — K625 Hemorrhage of anus and rectum: Secondary | ICD-10-CM

## 2012-03-09 LAB — BASIC METABOLIC PANEL
Chloride: 98 mEq/L (ref 96–112)
GFR: 49.71 mL/min — ABNORMAL LOW (ref >60.00)
Glucose, Bld: 156 mg/dL — ABNORMAL HIGH (ref 70–99)
Potassium: 3.6 mEq/L (ref 3.5–5.1)
Sodium: 136 mEq/L (ref 135–145)

## 2012-03-09 MED ORDER — GUAIFENESIN-CODEINE 100-10 MG/5ML PO SYRP: 10.0000 mL | ORAL_SOLUTION | Freq: Three times a day (TID) | ORAL | Status: DC | PRN

## 2012-03-09 MED ORDER — METFORMIN HCL ER 500 MG PO TB24: 1000.0000 mg | ORAL_TABLET | Freq: Every day | ORAL | Status: DC

## 2012-03-09 NOTE — Patient Instructions (Addendum)
Schedule your complete physical 3 months We'll notify you of your lab results Try and get back to your low carb diet Continue stool softeners, fiber, and WATER! If you continue to see blood- call me Decrease the Lisinopril to 1 daily Call with any questions or concerns Happy Holidays!!

## 2012-03-09 NOTE — Progress Notes (Signed)
  Subjective:    Patient ID: Dana Payne, female    DOB: [birth year], 46 y.o.   MRN: [id number]  HPI HTN- chronic problem, actually has been running low.  Having dizziness w/ standing too quickly.  No CP, SOB, HAs, visual changes, edema.  DM- chronic problem, decreased metformin to once daily.  CBGs when checked are 150-175 b/c she has changed diet in favor of convenient food.  Denies symptomatic lows.  'i'm feeling good'.  UTD on eye exam.  + early neuropathy of feet (dx'd by Podiatrist in [place])  Rectal bleeding- had colonoscopy last year at this time for rectal bleeding and was dx'd w/ 'bacteria on the colon and it's really rare'.  1 month ago developed 'spotting' w/ BMs.  BM was hard- started stool softener and blood resolved.  Last saw blood 3 days ago on toilet tissue, small amount.  Only after BM- had to strain.   Review of Systems For ROS see HPI     Objective:   Physical Exam  Constitutional: She is oriented to person, place, and time. She appears well-developed and well-nourished. No distress.  HENT:  Head: Normocephalic and atraumatic.  Eyes: Conjunctivae normal and EOM are normal. Pupils are equal, round, and reactive to light.  Neck: Normal range of motion. Neck supple. No thyromegaly present.  Cardiovascular: Normal rate, regular rhythm, normal heart sounds and intact distal pulses.   No murmur heard. Pulmonary/Chest: Effort normal and breath sounds normal. No respiratory distress.  Abdominal: Soft. She exhibits no distension. There is no tenderness.  Genitourinary:       Pt declined exam  Musculoskeletal: She exhibits no edema.  Lymphadenopathy:    She has no cervical adenopathy.  Neurological: She is alert and oriented to person, place, and time.  Skin: Skin is warm and dry.  Psychiatric: She has a normal mood and affect. Her behavior is normal.          Assessment & Plan:

## 2012-03-16 ENCOUNTER — Ambulatory Visit: Payer: 59 | Admitting: Family Medicine

## 2012-03-19 NOTE — Assessment & Plan Note (Signed)
Chronic problem but lately has been hypotensive.  Will decrease Lisinopril to 1 tab daily and follow closely.  Pt expressed understanding and is in agreement w/ plan.

## 2012-03-19 NOTE — Assessment & Plan Note (Signed)
Recurrent issue.  Pt admits to constipation and straining w/ recent dietary changes.  Only sees blood w/ straining and hard BMs.  sxs disappear w/ addition of stool softeners.  Reviewed lifestyle and dietary changes.  Will follow.

## 2012-03-19 NOTE — Assessment & Plan Note (Signed)
Chronic problem.  Admits that diet recently has been poor and CBGs have subsequently increased.  UTD on eye exam.  Encouraged her to resume low carb diet.  Check labs.  Adjust meds prn

## 2012-04-07 ENCOUNTER — Ambulatory Visit (INDEPENDENT_AMBULATORY_CARE_PROVIDER_SITE_OTHER): Payer: 59 | Admitting: Family Medicine

## 2012-04-07 ENCOUNTER — Encounter: Payer: Self-pay | Admitting: Family Medicine

## 2012-04-07 VITALS — BP 100/70 | HR 87 | Temp 98.1°F | Ht 65.0 in | Wt 170.2 lb

## 2012-04-07 DIAGNOSIS — I10 Essential (primary) hypertension: Secondary | ICD-10-CM

## 2012-04-07 DIAGNOSIS — R799 Abnormal finding of blood chemistry, unspecified: Secondary | ICD-10-CM

## 2012-04-07 DIAGNOSIS — E119 Type 2 diabetes mellitus without complications: Secondary | ICD-10-CM

## 2012-04-07 DIAGNOSIS — R7989 Other specified abnormal findings of blood chemistry: Secondary | ICD-10-CM | POA: Insufficient documentation

## 2012-04-07 LAB — BASIC METABOLIC PANEL
BUN: 12 mg/dL (ref 6–23)
CO2: 29 mEq/L (ref 19–32)
Chloride: 103 mEq/L (ref 96–112)
Potassium: 3.9 mEq/L (ref 3.5–5.1)

## 2012-04-07 NOTE — Assessment & Plan Note (Signed)
New at last visit.  Decreased ACE.  Increased fluids.  Recheck today.

## 2012-04-07 NOTE — Assessment & Plan Note (Signed)
Chronic problem.  On Metformin daily.  Has improved diet and lost 4 lbs.  Applauded her efforts.

## 2012-04-07 NOTE — Patient Instructions (Addendum)
We'll notify you of your lab results and make any changes if needed Keep up the good work!  You look great! Continue the healthy diet and try and get regular exercise Call with any questions or concerns [place] [place] Christmas!!!!

## 2012-04-07 NOTE — Progress Notes (Signed)
  Subjective:    Patient ID: Dana Payne, female    DOB: 1966/04/11, 46 y.o.   MRN: [id number]  HPI Elevated Cr- increased water intake.  Taking 2 Metformin daily.  Decreased ACE at last visit.  Due for repeat labs.  DM- A1C was elevated at last check.  Has tried to improve diet.  Has lost 4 lbs since last visit.  Taking metformin 1000mg  XR daily  HTN- at last visit was on 2 lisinopril HCTZ, decreased to 1 tab daily.  BP looks great today.  No longer feeling dizzy w/ position changes.  Has increased fluid intake.  No CP, SOB, HAs, visual changes, edema.   Review of Systems For ROS see HPI     Objective:   Physical Exam  Constitutional: She is oriented to person, place, and time. She appears well-developed and well-nourished. No distress.  HENT:  Head: Normocephalic and atraumatic.  Mouth/Throat: Mucous membranes are normal.  Eyes: Conjunctivae normal and EOM are normal. Pupils are equal, round, and reactive to light.  Neck: Normal range of motion. Neck supple. No thyromegaly present.  Cardiovascular: Normal rate, regular rhythm, normal heart sounds and intact distal pulses.   No murmur heard. Pulmonary/Chest: Effort normal and breath sounds normal. No respiratory distress. She has no wheezes.  Abdominal: Soft. She exhibits no distension. There is no tenderness.  Musculoskeletal: She exhibits no edema.  Lymphadenopathy:    She has no cervical adenopathy.  Neurological: She is alert and oriented to person, place, and time.  Skin: Skin is warm and dry.  Psychiatric: She has a normal mood and affect. Her behavior is normal.          Assessment & Plan:

## 2012-04-07 NOTE — Assessment & Plan Note (Signed)
Chronic problem.  Was having orthostatic hypotension.  Decreased ACE at last visit- BP still well controlled today.  Will continue current dose.  Pt expressed understanding and is in agreement w/ plan.

## 2012-04-10 ENCOUNTER — Encounter: Payer: Self-pay | Admitting: *Deleted

## 2012-04-17 ENCOUNTER — Telehealth: Payer: Self-pay | Admitting: Family Medicine

## 2012-04-17 DIAGNOSIS — E119 Type 2 diabetes mellitus without complications: Secondary | ICD-10-CM

## 2012-04-17 DIAGNOSIS — E7800 Pure hypercholesterolemia, unspecified: Secondary | ICD-10-CM

## 2012-04-17 DIAGNOSIS — I10 Essential (primary) hypertension: Secondary | ICD-10-CM

## 2012-04-18 MED ORDER — METFORMIN HCL ER 500 MG PO TB24: 1000.0000 mg | ORAL_TABLET | Freq: Every day | ORAL | Status: DC

## 2012-04-18 MED ORDER — SIMVASTATIN 40 MG PO TABS: 40.0000 mg | ORAL_TABLET | Freq: Every day | ORAL | Status: DC

## 2012-04-18 NOTE — Telephone Encounter (Signed)
Refill for simvastatin and metformin sent to pharmacy

## 2012-04-20 ENCOUNTER — Encounter: Payer: Self-pay | Admitting: Family Medicine

## 2012-04-20 ENCOUNTER — Ambulatory Visit (INDEPENDENT_AMBULATORY_CARE_PROVIDER_SITE_OTHER): Payer: BC Managed Care – PPO | Admitting: Family Medicine

## 2012-04-20 VITALS — BP 118/70 | HR 110 | Temp 100.2°F | Ht 65.0 in | Wt 166.2 lb

## 2012-04-20 DIAGNOSIS — J111 Influenza due to unidentified influenza virus with other respiratory manifestations: Secondary | ICD-10-CM | POA: Insufficient documentation

## 2012-04-20 MED ORDER — OSELTAMIVIR PHOSPHATE 75 MG PO CAPS: 75.0000 mg | ORAL_CAPSULE | Freq: Two times a day (BID) | ORAL | Status: DC

## 2012-04-20 NOTE — Assessment & Plan Note (Signed)
New.  Pt's sudden onset of severe sxs w/out obvious bacterial infxn on PE consistent w/ flu.  Rapid test confirms.  Start Tamiflu.  Reviewed supportive care and red flags that should prompt return.  Pt expressed understanding and is in agreement w/ plan.

## 2012-04-20 NOTE — Patient Instructions (Addendum)
This is the Flu Start Tamiflu twice daily for 5 days Alternate tylenol and ibuprofen every 4 hrs while awake for pain/fever Drink plenty of fluids Call with any questions or concerns Hang in there!!!

## 2012-04-20 NOTE — Progress Notes (Signed)
  Subjective:    Patient ID: Dana Payne, female    DOB: [birth year], 46 y.o.   MRN: [id number]  HPI ? Flu- sxs started suddenly on Monday w/ fever, chills, N/V, diffuse bodyaches.  + sick contacts.  + cough- productive of thick, white sputum.  Hurts to touch skin.   Review of Systems For ROS see HPI     Objective:   Physical Exam  Vitals reviewed. Constitutional: She appears well-developed and well-nourished. No distress.       Obviously not feeling well  HENT:  Head: Normocephalic and atraumatic.  Right Ear: Tympanic membrane normal.  Left Ear: Tympanic membrane normal.  Nose: Mucosal edema and rhinorrhea present. Right sinus exhibits no maxillary sinus tenderness and no frontal sinus tenderness. Left sinus exhibits no maxillary sinus tenderness and no frontal sinus tenderness.  Mouth/Throat: Uvula is midline and mucous membranes are normal. Posterior oropharyngeal erythema present. No oropharyngeal exudate.  Eyes: Conjunctivae normal and EOM are normal. Pupils are equal, round, and reactive to light.  Neck: Normal range of motion. Neck supple.  Cardiovascular: Normal rate, regular rhythm and normal heart sounds.   Pulmonary/Chest: Effort normal and breath sounds normal. No respiratory distress. She has no wheezes.       + dry cough  Lymphadenopathy:    She has no cervical adenopathy.  Skin: Skin is warm.          Assessment & Plan:

## 2012-06-12 ENCOUNTER — Other Ambulatory Visit: Payer: Self-pay | Admitting: Family Medicine

## 2012-06-12 DIAGNOSIS — I10 Essential (primary) hypertension: Secondary | ICD-10-CM

## 2012-06-14 NOTE — Telephone Encounter (Signed)
Refill for lisinopril sent to [facility] pharmacy 

## 2012-09-15 ENCOUNTER — Encounter: Payer: Self-pay | Admitting: Family Medicine

## 2012-09-15 ENCOUNTER — Ambulatory Visit (INDEPENDENT_AMBULATORY_CARE_PROVIDER_SITE_OTHER): Payer: BC Managed Care – PPO | Admitting: Family Medicine

## 2012-09-15 VITALS — BP 118/70 | HR 74 | Temp 97.8°F | Ht 64.5 in | Wt 169.4 lb

## 2012-09-15 DIAGNOSIS — I10 Essential (primary) hypertension: Secondary | ICD-10-CM

## 2012-09-15 DIAGNOSIS — E785 Hyperlipidemia, unspecified: Secondary | ICD-10-CM

## 2012-09-15 DIAGNOSIS — E119 Type 2 diabetes mellitus without complications: Secondary | ICD-10-CM

## 2012-09-15 LAB — LIPID PANEL
Cholesterol: 113 mg/dL (ref 0–200)
HDL: 42.5 mg/dL (ref >39.00)
Total CHOL/HDL Ratio: 3
Triglycerides: 93 mg/dL (ref 0.0–149.0)

## 2012-09-15 LAB — BASIC METABOLIC PANEL
BUN: 9 mg/dL (ref 6–23)
CO2: 29 mEq/L (ref 19–32)
Calcium: 9.3 mg/dL (ref 8.4–10.5)
Chloride: 102 mEq/L (ref 96–112)
Creatinine, Ser: 0.7 mg/dL (ref 0.4–1.2)
Glucose, Bld: 108 mg/dL — ABNORMAL HIGH (ref 70–99)

## 2012-09-15 LAB — HEPATIC FUNCTION PANEL
ALT: 21 U/L (ref 0–35)
Albumin: 4.3 g/dL (ref 3.5–5.2)
Bilirubin, Direct: 0 mg/dL (ref 0.0–0.3)
Total Protein: 7.1 g/dL (ref 6.0–8.3)

## 2012-09-15 LAB — HEMOGLOBIN A1C: Hgb A1c MFr Bld: 7 % — ABNORMAL HIGH (ref 4.6–6.5)

## 2012-09-15 NOTE — Assessment & Plan Note (Signed)
Chronic problem, well controlled.  Asymptomatic.  Check labs.  No anticipated changes. 

## 2012-09-15 NOTE — Assessment & Plan Note (Signed)
Chronic problem, take meds as directed.  Not following ADA diet or exercising regularly.  Encouraged her to schedule eye exam.  Check labs.  Adjust meds prn

## 2012-09-15 NOTE — Patient Instructions (Addendum)
Schedule your complete physical in 3-4 months We'll notify you of your lab results and make any changes if needed Stop cheating on your diet! Get back to regular exercise Schedule your eye exam Call with any questions or concerns Happy Memorial Day!

## 2012-09-15 NOTE — Assessment & Plan Note (Signed)
Chronic problem, tolerating statin w/out difficulty.  Check labs.  Adjust meds prn  

## 2012-09-15 NOTE — Progress Notes (Signed)
  Subjective:    Patient ID: Dana Payne, female    DOB: 07-06-1965, 47 y.o.   MRN: [id number]  HPI DM- chronic problem, on Metformin.  Pt admits to increased sweets recently.  Not walking regularly like she was before.  CBGs running 130s on average.  Denies symptomatic lows.  UTD on eye exam.  No numbness/tingling.  HTN- chronic problem, well controlled today.  On Lisinopril HCTZ.  No CP, SOB, HAs, visual changes, edema.  Hyperlipidemia- chronic problem, on Simvastatin.  No abd pain, N/V, myalgias.   Review of Systems For ROS see HPI     Objective:   Physical Exam  Vitals reviewed. Constitutional: She is oriented to person, place, and time. She appears well-developed and well-nourished. No distress.  HENT:  Head: Normocephalic and atraumatic.  Eyes: Conjunctivae and EOM are normal. Pupils are equal, round, and reactive to light.  Neck: Normal range of motion. Neck supple. Thyromegaly present.  Cardiovascular: Normal rate, regular rhythm, normal heart sounds and intact distal pulses.   No murmur heard. Pulmonary/Chest: Effort normal and breath sounds normal. No respiratory distress.  Abdominal: Soft. She exhibits no distension. There is no tenderness.  Musculoskeletal: She exhibits no edema.  Lymphadenopathy:    She has no cervical adenopathy.  Neurological: She is alert and oriented to person, place, and time.  Skin: Skin is warm and dry.  Psychiatric: She has a normal mood and affect. Her behavior is normal.          Assessment & Plan:

## 2012-09-19 ENCOUNTER — Encounter: Payer: Self-pay | Admitting: *Deleted

## 2012-11-21 ENCOUNTER — Other Ambulatory Visit: Payer: Self-pay | Admitting: Family Medicine

## 2012-11-24 ENCOUNTER — Other Ambulatory Visit: Payer: Self-pay | Admitting: *Deleted

## 2012-11-24 NOTE — Telephone Encounter (Signed)
Rx sent to the pharmacy by e-script.//AB/CMA 

## 2012-12-23 ENCOUNTER — Other Ambulatory Visit: Payer: Self-pay | Admitting: Family Medicine

## 2012-12-26 NOTE — Telephone Encounter (Signed)
Med filled.  

## 2012-12-28 ENCOUNTER — Other Ambulatory Visit: Payer: Self-pay | Admitting: Family Medicine

## 2012-12-28 NOTE — Telephone Encounter (Signed)
Spoke with pharmacy. Rx was a duplicate and has already been filled. SW

## 2013-01-01 ENCOUNTER — Other Ambulatory Visit: Payer: Self-pay | Admitting: Family Medicine

## 2013-01-01 DIAGNOSIS — I10 Essential (primary) hypertension: Secondary | ICD-10-CM

## 2013-01-02 NOTE — Telephone Encounter (Signed)
Rx filled and sent to [facility] [place], [place]

## 2013-01-03 ENCOUNTER — Ambulatory Visit: Payer: BC Managed Care – PPO | Admitting: Family Medicine

## 2013-01-18 ENCOUNTER — Ambulatory Visit: Payer: BC Managed Care – PPO | Admitting: Family Medicine

## 2013-01-22 ENCOUNTER — Encounter: Payer: Self-pay | Admitting: Gynecology

## 2013-01-25 ENCOUNTER — Encounter: Payer: Self-pay | Admitting: Family Medicine

## 2013-01-25 ENCOUNTER — Ambulatory Visit (INDEPENDENT_AMBULATORY_CARE_PROVIDER_SITE_OTHER): Payer: BC Managed Care – PPO | Admitting: Family Medicine

## 2013-01-25 VITALS — BP 118/82 | HR 88 | Temp 98.2°F | Resp 16 | Wt 167.5 lb

## 2013-01-25 DIAGNOSIS — I10 Essential (primary) hypertension: Secondary | ICD-10-CM

## 2013-01-25 DIAGNOSIS — E119 Type 2 diabetes mellitus without complications: Secondary | ICD-10-CM

## 2013-01-25 DIAGNOSIS — E785 Hyperlipidemia, unspecified: Secondary | ICD-10-CM

## 2013-01-25 LAB — LIPID PANEL
HDL: 40.4 mg/dL (ref >39.00)
LDL Cholesterol: 67 mg/dL (ref 0–99)
Total CHOL/HDL Ratio: 3

## 2013-01-25 LAB — BASIC METABOLIC PANEL
CO2: 30 mEq/L (ref 19–32)
Chloride: 98 mEq/L (ref 96–112)
Sodium: 136 mEq/L (ref 135–145)

## 2013-01-25 LAB — HEPATIC FUNCTION PANEL
ALT: 21 U/L (ref 0–35)
Alkaline Phosphatase: 69 U/L (ref 39–117)
Bilirubin, Direct: 0.1 mg/dL (ref 0.0–0.3)
Total Bilirubin: 0.9 mg/dL (ref 0.3–1.2)
Total Protein: 7.5 g/dL (ref 6.0–8.3)

## 2013-01-25 LAB — HEMOGLOBIN A1C: Hgb A1c MFr Bld: 7.7 % — ABNORMAL HIGH (ref 4.6–6.5)

## 2013-01-25 NOTE — Assessment & Plan Note (Signed)
Chronic problem.  Checking sugars infrequently.  Denies symptomatic lows.  Due for eye exam- referral made.  Foot exam done.  On ACE for renal protection.  Check labs.  Adjust meds prn

## 2013-01-25 NOTE — Patient Instructions (Addendum)
Schedule your complete physical in 3-4 months We'll notify you of your lab results and make any changes if needed We'll call you with your eye exam Call with any questions or concerns Happy Fall!!!

## 2013-01-25 NOTE — Assessment & Plan Note (Signed)
Chronic problem.  Excellent control.  Asymptomatic.  Check labs.  No anticipated med changes. 

## 2013-01-25 NOTE — Progress Notes (Signed)
  Subjective:    Patient ID: Dana Payne, female    DOB: 1965-06-17, 47 y.o.   MRN: [id number]  HPI DM- chronic problem, on Metformin.  Due for eye exam.  No symptomatic lows.  Denies N/V/D.  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    HTN- chronic problem, on Lisinopril HCT.  No CP, SOB.  + HAs.  No visual changes, edema.  Hyperlipidemia- chronic problem, on Simvastatin.  No abd pain, N/V, myalgias.   Review of Systems For ROS see HPI     Objective:   Physical Exam  Vitals reviewed. Constitutional: She is oriented to person, place, and time. She appears well-developed and well-nourished. No distress.  HENT:  Head: Normocephalic and atraumatic.  Eyes: Conjunctivae and EOM are normal. Pupils are equal, round, and reactive to light.  Neck: Normal range of motion. Neck supple. Thyromegaly present.  Cardiovascular: Normal rate, regular rhythm, normal heart sounds and intact distal pulses.   No murmur heard. Pulmonary/Chest: Effort normal and breath sounds normal. No respiratory distress.  Abdominal: Soft. She exhibits no distension. There is no tenderness.  Musculoskeletal: She exhibits no edema.  Lymphadenopathy:    She has no cervical adenopathy.  Neurological: She is alert and oriented to person, place, and time.  Skin: Skin is warm and dry.  Psychiatric: She has a normal mood and affect. Her behavior is normal.          Assessment & Plan:

## 2013-01-25 NOTE — Assessment & Plan Note (Signed)
Chronic problem.  Tolerating statin w/out difficulty.  Check labs.  Adjust meds prn  

## 2013-01-27 ENCOUNTER — Other Ambulatory Visit: Payer: Self-pay | Admitting: Family Medicine

## 2013-01-29 NOTE — Telephone Encounter (Signed)
Med filled.  

## 2013-02-01 ENCOUNTER — Ambulatory Visit (INDEPENDENT_AMBULATORY_CARE_PROVIDER_SITE_OTHER): Payer: BC Managed Care – PPO | Admitting: Gynecology

## 2013-02-01 ENCOUNTER — Encounter: Payer: Self-pay | Admitting: Gynecology

## 2013-02-01 ENCOUNTER — Other Ambulatory Visit (HOSPITAL_COMMUNITY)
Admission: RE | Admit: 2013-02-01 | Discharge: 2013-02-01 | Disposition: A | Discharge status: Home / Self Care | Payer: BC Managed Care – PPO | Source: Ambulatory Visit | Attending: Gynecology | Admitting: Gynecology

## 2013-02-01 VITALS — BP 118/76 | Ht 64.5 in | Wt 165.0 lb

## 2013-02-01 DIAGNOSIS — Z01419 Encounter for gynecological examination (general) (routine) without abnormal findings: Secondary | ICD-10-CM

## 2013-02-01 DIAGNOSIS — N951 Menopausal and female climacteric states: Secondary | ICD-10-CM

## 2013-02-01 DIAGNOSIS — Z79890 Hormone replacement therapy (postmenopausal): Secondary | ICD-10-CM

## 2013-02-01 MED ORDER — ESTRADIOL 0.52 MG/0.87 GM (0.06%) TD GEL: 1.0000 "application " | Freq: Every day | TRANSDERMAL | Status: DC

## 2013-02-01 NOTE — Progress Notes (Signed)
Dana Payne 1965-08-15 [id number]   History:    47 y.o.  for annual gyn exam with complaints of worsening hot flashes,irritability and night sweats.She is being followed by Dr. Beverely Low who is monitoingr her hypertension, hypercholesterolemia, and type 2 diabetes. She scheduled to see her later today for blood work so no labs will be drawn today. Patient with prior history of total abdominal hysterectomy. Patient last year was noted to have an elevated FSH indicative of her being in the menopause.She had a past history of CO2 laser of her cervix by another provider many years ago and I do not have the grade of dysplasia report but her Pap smears have been normal. Patient refused the flu vaccine today. Patient stated in 2012 she had a colonoscopy because of rectal bleeding and there was some form of colonic inflammation but no polyps. She did mention that she has an aunt with colon cancer an uncle with colon polyps in her father was recently diagnosed with colon polyps.    Past medical history,surgical history, family history and social history were all reviewed and documented in the EPIC chart.  Gynecologic History No LMP recorded. Patient has had a hysterectomy. Contraception: status post hysterectomy Last Pap: 2013. Results were: normal Last mammogram: 2013. Results were: normal but dense  Obstetric History OB History  Gravida Para Term Preterm AB SAB TAB Ectopic Multiple Living  0                  ROS: A ROS was performed and pertinent positives and negatives are included in the history.  GENERAL: No fevers or chills. HEENT: No change in vision, no earache, sore throat or sinus congestion. NECK: No pain or stiffness. CARDIOVASCULAR: No chest pain or pressure. No palpitations. PULMONARY: No shortness of breath, cough or wheeze. GASTROINTESTINAL: No abdominal pain, nausea, vomiting or diarrhea, melena or bright red blood per rectum. GENITOURINARY: No urinary frequency, urgency, hesitancy or  dysuria. MUSCULOSKELETAL: No joint or muscle pain, no back pain, no recent trauma. DERMATOLOGIC: No rash, no itching, no lesions. ENDOCRINE: No polyuria, polydipsia, no heat or cold intolerance. No recent change in weight. HEMATOLOGICAL: No anemia or easy bruising or bleeding. NEUROLOGIC: No headache, seizures, numbness, tingling or weakness. PSYCHIATRIC: No depression, no loss of interest in normal activity or change in sleep pattern.     Exam: chaperone present  BP 118/76  Ht 5' 4.5" (1.638 m)  Wt 165 lb (74.844 kg)  BMI 27.9 kg/m2  Body mass index is 27.9 kg/(m^2).  General appearance : Well developed well nourished female. No acute distress HEENT: Neck supple, trachea midline, no carotid bruits, no thyroidmegaly Lungs: Clear to auscultation, no rhonchi or wheezes, or rib retractions  Heart: Regular rate and rhythm, no murmurs or gallops Breast:Examined in sitting and supine position were symmetrical in appearance, no palpable masses or tenderness,  no skin retraction, no nipple inversion, no nipple discharge, no skin discoloration, no axillary or supraclavicular lymphadenopathy Abdomen: no palpable masses or tenderness, no rebound or guarding Extremities: no edema or skin discoloration or tenderness  Pelvic:  Bartholin, Urethra, Skene Glands: Within normal limits             Vagina: No gross lesions or discharge  Cervix: absent  Uterus Absence  Adnexa  Without masses or tenderness  Anus and perineum  normal   Rectovaginal  normal sphincter tone without palpated masses or tenderness             Hemoccult card provided  Assessment/Plan:  47 y.o. female for annual exam with menopausal symptoms. FSH confirmed previously indicating in menopause. Patient would like treatment now. We had a lengthy discussion on hormone replacement therapy. We discussed women's health initiative study. The risks benefits and pros and cons as well as potential risk of breast cancer were discussed in  detail. Literature information was provided. We discussed different routes of administration of estrogen. She will be started on Elestrin 0.06% transdermal gel to apply to only 1 on each bedtime. She was monitored also to begin taking calcium with vitamin D and regular exercise for osteoporosis prevention. We will obtain a baseline bone density study at age 13. Hemoccult cards were provided for her to submit to the office for testing. Would recommend colonoscopy in 2017.Her Pap smear was done today despite the new screening guidelines since we do not have a handle on the degree of dysplasia that she had in the past whereby she had CO2 laser of her cervix. She has had a hysterectomy in the past and have been normal. We will continue to follow for a few more years.    Note: This dictation was prepared with  Dragon/digital dictation along withSmart phrase technology. Any transcriptional errors that result from this process are unintentional.   Ok Edwards MD, 10:30 AM 02/01/2013

## 2013-02-01 NOTE — Patient Instructions (Signed)

## 2013-02-01 NOTE — Addendum Note (Signed)
Addended by: Bertram Savin A on: 02/01/2013 10:39 AM   Modules accepted: Orders

## 2013-02-06 ENCOUNTER — Encounter: Payer: Self-pay | Admitting: Gynecology

## 2013-02-20 ENCOUNTER — Other Ambulatory Visit: Payer: Self-pay

## 2013-03-02 ENCOUNTER — Encounter: Payer: Self-pay | Admitting: Gynecology

## 2013-03-15 ENCOUNTER — Other Ambulatory Visit: Payer: BC Managed Care – PPO | Admitting: Anesthesiology

## 2013-03-15 ENCOUNTER — Encounter: Payer: Self-pay | Admitting: General Practice

## 2013-03-15 DIAGNOSIS — Z1211 Encounter for screening for malignant neoplasm of colon: Secondary | ICD-10-CM

## 2013-04-12 ENCOUNTER — Encounter: Payer: Self-pay | Admitting: Family Medicine

## 2013-04-12 ENCOUNTER — Ambulatory Visit (INDEPENDENT_AMBULATORY_CARE_PROVIDER_SITE_OTHER): Payer: BC Managed Care – PPO | Admitting: Family Medicine

## 2013-04-12 VITALS — BP 136/80 | HR 96 | Temp 98.5°F | Resp 16 | Wt 167.2 lb

## 2013-04-12 DIAGNOSIS — J0100 Acute maxillary sinusitis, unspecified: Secondary | ICD-10-CM

## 2013-04-12 MED ORDER — FLUCONAZOLE 150 MG PO TABS: 150.0000 mg | ORAL_TABLET | Freq: Once | ORAL | Status: DC

## 2013-04-12 MED ORDER — PROMETHAZINE-DM 6.25-15 MG/5ML PO SYRP: 5.0000 mL | ORAL_SOLUTION | Freq: Four times a day (QID) | ORAL | Status: DC | PRN

## 2013-04-12 MED ORDER — AMOXICILLIN 875 MG PO TABS: 875.0000 mg | ORAL_TABLET | Freq: Two times a day (BID) | ORAL | Status: DC

## 2013-04-12 NOTE — Progress Notes (Signed)
   Subjective:    Patient ID: Dana Payne, female    DOB: 06/21/1965, 47 y.o.   MRN: [id number]  HPI URI- sxs started 3 weeks ago.  1st week had GI bug- nausea, diarrhea.  Last week fiance had flu.  Pt then developed fever, chills.  Pt now w/ 'thick post nasal drip', sore throat.  Productive cough.  Some sinus pain/pressure.  Bilateral ear fullness.  No current fever.   Review of Systems For ROS see HPI     Objective:   Physical Exam  Vitals reviewed. Constitutional: She appears well-developed and well-nourished. No distress.  HENT:  Head: Normocephalic and atraumatic.  Right Ear: Tympanic membrane normal.  Left Ear: Tympanic membrane normal.  Nose: Mucosal edema and rhinorrhea present. Right sinus exhibits maxillary sinus tenderness and frontal sinus tenderness. Left sinus exhibits maxillary sinus tenderness and frontal sinus tenderness.  Mouth/Throat: Uvula is midline and mucous membranes are normal. Posterior oropharyngeal erythema present. No oropharyngeal exudate.  Eyes: Conjunctivae and EOM are normal. Pupils are equal, round, and reactive to light.  Neck: Normal range of motion. Neck supple.  Cardiovascular: Normal rate, regular rhythm and normal heart sounds.   Pulmonary/Chest: Effort normal and breath sounds normal. No respiratory distress. She has no wheezes.  Lymphadenopathy:    She has no cervical adenopathy.          Assessment & Plan:

## 2013-04-12 NOTE — Progress Notes (Signed)
Pre visit review using our clinic review tool, if applicable. No additional management support is needed unless otherwise documented below in the visit note. 

## 2013-04-12 NOTE — Patient Instructions (Signed)
Follow up as needed Start the Amoxicillin twice daily- take w/ food Drink plenty of fluids Use the cough syrup as needed- will cause drowsiness Mucinex DM for daytime cough REST! Hang in there! Happy Holidays!!!

## 2013-04-12 NOTE — Assessment & Plan Note (Signed)
Pt's sxs and PE consistent w/ infxn.  Start abx.  Cough meds prn.  Diflucan for yeast.  Reviewed supportive care and red flags that should prompt return.  Pt expressed understanding and is in agreement w/ plan.

## 2013-06-14 ENCOUNTER — Other Ambulatory Visit: Payer: Self-pay | Admitting: Family Medicine

## 2013-07-18 ENCOUNTER — Other Ambulatory Visit: Payer: Self-pay | Admitting: General Practice

## 2013-07-18 MED ORDER — SIMVASTATIN 40 MG PO TABS: ORAL_TABLET | ORAL | Status: DC

## 2013-07-19 ENCOUNTER — Encounter: Payer: Self-pay | Admitting: Family Medicine

## 2013-07-19 ENCOUNTER — Ambulatory Visit (INDEPENDENT_AMBULATORY_CARE_PROVIDER_SITE_OTHER): Payer: BC Managed Care – PPO | Admitting: Family Medicine

## 2013-07-19 VITALS — BP 116/80 | HR 81 | Temp 98.2°F | Resp 16 | Wt 170.1 lb

## 2013-07-19 DIAGNOSIS — I10 Essential (primary) hypertension: Secondary | ICD-10-CM

## 2013-07-19 DIAGNOSIS — M7710 Lateral epicondylitis, unspecified elbow: Secondary | ICD-10-CM

## 2013-07-19 DIAGNOSIS — E1149 Type 2 diabetes mellitus with other diabetic neurological complication: Secondary | ICD-10-CM

## 2013-07-19 DIAGNOSIS — E1142 Type 2 diabetes mellitus with diabetic polyneuropathy: Secondary | ICD-10-CM

## 2013-07-19 DIAGNOSIS — M7712 Lateral epicondylitis, left elbow: Secondary | ICD-10-CM

## 2013-07-19 DIAGNOSIS — E785 Hyperlipidemia, unspecified: Secondary | ICD-10-CM

## 2013-07-19 LAB — LIPID PANEL
Cholesterol: 136 mg/dL (ref 0–200)
HDL: 40.6 mg/dL (ref >39.00)
LDL Cholesterol: 70 mg/dL (ref 0–99)
TRIGLYCERIDES: 129 mg/dL (ref 0.0–149.0)
Total CHOL/HDL Ratio: 3
VLDL: 25.8 mg/dL (ref 0.0–40.0)

## 2013-07-19 LAB — BASIC METABOLIC PANEL
BUN: 11 mg/dL (ref 6–23)
CALCIUM: 9.8 mg/dL (ref 8.4–10.5)
CO2: 31 meq/L (ref 19–32)
CREATININE: 0.8 mg/dL (ref 0.4–1.2)
Chloride: 97 mEq/L (ref 96–112)
GFR: 105.77 mL/min (ref >60.00)
GLUCOSE: 181 mg/dL — AB (ref 70–99)
Potassium: 3.9 mEq/L (ref 3.5–5.1)
Sodium: 136 mEq/L (ref 135–145)

## 2013-07-19 LAB — HEPATIC FUNCTION PANEL
ALBUMIN: 4.8 g/dL (ref 3.5–5.2)
ALT: 24 U/L (ref 0–35)
AST: 20 U/L (ref 0–37)
Alkaline Phosphatase: 80 U/L (ref 39–117)
BILIRUBIN DIRECT: 0 mg/dL (ref 0.0–0.3)
TOTAL PROTEIN: 7.4 g/dL (ref 6.0–8.3)
Total Bilirubin: 0.8 mg/dL (ref 0.3–1.2)

## 2013-07-19 LAB — TSH: TSH: 1.45 u[IU]/mL (ref 0.35–5.50)

## 2013-07-19 LAB — HEMOGLOBIN A1C: HEMOGLOBIN A1C: 8.4 % — AB (ref 4.6–6.5)

## 2013-07-19 MED ORDER — DICLOFENAC SODIUM 1 % TD GEL: 2.0000 g | Freq: Four times a day (QID) | TRANSDERMAL | Status: DC

## 2013-07-19 NOTE — Progress Notes (Signed)
Pre visit review using our clinic review tool, if applicable. No additional management support is needed unless otherwise documented below in the visit note. 

## 2013-07-19 NOTE — Assessment & Plan Note (Signed)
Chronic problem, well controlled.  Asymptomatic.  Check labs.  No anticipated changes.

## 2013-07-19 NOTE — Assessment & Plan Note (Signed)
Chronic problem.  Tolerating statin w/o difficulty.  Check labs.  Adjust meds prn  

## 2013-07-19 NOTE — Patient Instructions (Signed)
Follow up in 3 months to recheck your sugar We'll notify you of your lab results and make any changes if needed Apply the Voltaren gel up to 4x/day on the elbow ICE at least twice a day Get a counter force strap at any pharmacy and wear regularly- particularly when at work If no improvement in 2 weeks- call me and we'll send you to ortho Try and limit your sweets Call with any questions or concerns Happy Spring!

## 2013-07-19 NOTE — Progress Notes (Signed)
   Subjective:    Patient ID: Dana Payne, female    DOB: Oct 21, 1965, 48 y.o.   MRN: [id number]  HPI HTN- chronic problem, on Lisinopril HCTZ.  No CP, SOB, HAs, + blurry vision- UTD on eye exam (December).  No edema.  No N/V  Hyperlipidemia- chronic problem, on Simvastatin.  Denies abd pain, N/V.  DM- chronic problem, not checking CBGs regularly.  Denies symptomatic lows.  Last CBG 124.  Has been sneaking sweets x3 months.  + neuropathy.  L elbow pain- sxs started 1-2 weeks ago.  Complains of weakness, decreased strength, appears to be swollen, 'it hurts'.  Pain is worse w/ elbow extension.  Painful to grip or turn things.  Does a lot of repetitive motion w/ L hand.   Review of Systems For ROS see HPI     Objective:   Physical Exam  Vitals reviewed. Constitutional: She is oriented to person, place, and time. She appears well-developed and well-nourished. No distress.  HENT:  Head: Normocephalic and atraumatic.  Eyes: Conjunctivae and EOM are normal. Pupils are equal, round, and reactive to light.  Neck: Normal range of motion. Neck supple. Thyromegaly present.  Cardiovascular: Normal rate, regular rhythm, normal heart sounds and intact distal pulses.   No murmur heard. Pulmonary/Chest: Effort normal and breath sounds normal. No respiratory distress.  Abdominal: Soft. She exhibits no distension. There is no tenderness.  Musculoskeletal: She exhibits tenderness (over L lateral epicondyle). She exhibits no edema.  Lymphadenopathy:    She has no cervical adenopathy.  Neurological: She is alert and oriented to person, place, and time.  Skin: Skin is warm and dry.  Psychiatric: She has a normal mood and affect. Her behavior is normal.          Assessment & Plan:

## 2013-07-19 NOTE — Assessment & Plan Note (Signed)
Chronic problem.  Pt reports she has increased sugar in diet- again reviewed importance of low carb diet.  UTD on eye exam.  Foot exam done today.  On ACE for renal protection.  Check labs.  Adjust meds prn

## 2013-07-19 NOTE — Assessment & Plan Note (Signed)
New.  Pt's sxs and PE consistent w/ dx.  Start topical NSAID gel, ice, counterforce strap.  If no improvement in pain after 2 weeks will refer to ortho.  Pt expressed understanding and is in agreement w/ plan.

## 2013-07-20 ENCOUNTER — Telehealth: Payer: Self-pay | Admitting: Family Medicine

## 2013-07-20 NOTE — Telephone Encounter (Signed)
Relevant patient education mailed to patient.  

## 2013-08-03 ENCOUNTER — Other Ambulatory Visit: Payer: Self-pay | Admitting: Family Medicine

## 2013-08-03 NOTE — Telephone Encounter (Signed)
Med filled.  

## 2013-08-16 ENCOUNTER — Telehealth: Payer: Self-pay

## 2013-08-16 NOTE — Telephone Encounter (Signed)
Relevant patient education mailed to patient.  

## 2013-09-21 ENCOUNTER — Telehealth: Payer: Self-pay | Admitting: Family Medicine

## 2013-09-21 DIAGNOSIS — M7712 Lateral epicondylitis, left elbow: Secondary | ICD-10-CM

## 2013-09-21 NOTE — Telephone Encounter (Signed)
Referral placed and pt notified

## 2013-09-21 NOTE — Telephone Encounter (Signed)
Ok to refer.

## 2013-09-21 NOTE — Telephone Encounter (Signed)
Pt was advised to call back in two weeks if arm was no better. Ok for referral or does she need to be seen again?

## 2013-09-21 NOTE — Telephone Encounter (Signed)
Caller name:Jazz   Call back number: [phone]   Reason for call:  Pt was seen for arm on 3/26.  Pt is still having pain and it is swelling.  Pt states that Dr. Birdie Riddle would refer her to a specialist if it did not get better.  Please advise

## 2013-11-14 ENCOUNTER — Other Ambulatory Visit: Payer: Self-pay | Admitting: Family Medicine

## 2013-11-15 NOTE — Telephone Encounter (Signed)
Med filled.  

## 2013-12-24 ENCOUNTER — Ambulatory Visit: Payer: BC Managed Care – PPO | Admitting: Family Medicine

## 2014-01-07 ENCOUNTER — Other Ambulatory Visit: Payer: Self-pay | Admitting: Family Medicine

## 2014-01-07 NOTE — Telephone Encounter (Signed)
Med filled.  

## 2014-01-11 ENCOUNTER — Telehealth: Payer: Self-pay | Admitting: Family Medicine

## 2014-01-11 ENCOUNTER — Other Ambulatory Visit: Payer: Self-pay | Admitting: Family Medicine

## 2014-01-11 MED ORDER — LISINOPRIL-HYDROCHLOROTHIAZIDE 20-12.5 MG PO TABS: ORAL_TABLET | ORAL | Status: DC

## 2014-01-11 NOTE — Telephone Encounter (Signed)
Caller name: Blanka  Relation to pt: self Call back number: [phone]   Reason for call: pt scheduled diabetic check for 01/17/14. Pt is completley out of lisinopril-hydrochlorothiazide (PRINZIDE,ZESTORETIC) 20-12.5 MG per tablet. Pt wanted to make you aware

## 2014-01-11 NOTE — Telephone Encounter (Signed)
Med filled.  

## 2014-01-11 NOTE — Telephone Encounter (Signed)
Med filled and letter mailed.  

## 2014-01-17 ENCOUNTER — Ambulatory Visit (INDEPENDENT_AMBULATORY_CARE_PROVIDER_SITE_OTHER): Payer: BC Managed Care – PPO | Admitting: Family Medicine

## 2014-01-17 ENCOUNTER — Encounter: Payer: Self-pay | Admitting: Family Medicine

## 2014-01-17 VITALS — BP 130/90 | HR 83 | Temp 97.9°F | Resp 16 | Wt 170.4 lb

## 2014-01-17 DIAGNOSIS — E1149 Type 2 diabetes mellitus with other diabetic neurological complication: Secondary | ICD-10-CM

## 2014-01-17 DIAGNOSIS — I10 Essential (primary) hypertension: Secondary | ICD-10-CM

## 2014-01-17 DIAGNOSIS — E1142 Type 2 diabetes mellitus with diabetic polyneuropathy: Secondary | ICD-10-CM

## 2014-01-17 DIAGNOSIS — E785 Hyperlipidemia, unspecified: Secondary | ICD-10-CM

## 2014-01-17 LAB — LIPID PANEL
CHOLESTEROL: 133 mg/dL (ref 0–200)
HDL: 44.6 mg/dL (ref >39.00)
LDL Cholesterol: 69 mg/dL (ref 0–99)
NonHDL: 88.4
Total CHOL/HDL Ratio: 3
Triglycerides: 99 mg/dL (ref 0.0–149.0)
VLDL: 19.8 mg/dL (ref 0.0–40.0)

## 2014-01-17 LAB — BASIC METABOLIC PANEL
BUN: 10 mg/dL (ref 6–23)
CO2: 30 meq/L (ref 19–32)
CREATININE: 0.8 mg/dL (ref 0.4–1.2)
Calcium: 9.6 mg/dL (ref 8.4–10.5)
Chloride: 101 mEq/L (ref 96–112)
GFR: 96.58 mL/min (ref >60.00)
GLUCOSE: 204 mg/dL — AB (ref 70–99)
POTASSIUM: 3.9 meq/L (ref 3.5–5.1)
Sodium: 137 mEq/L (ref 135–145)

## 2014-01-17 LAB — HEPATIC FUNCTION PANEL
ALT: 28 U/L (ref 0–35)
AST: 19 U/L (ref 0–37)
Albumin: 4.6 g/dL (ref 3.5–5.2)
Alkaline Phosphatase: 90 U/L (ref 39–117)
Bilirubin, Direct: 0.1 mg/dL (ref 0.0–0.3)
TOTAL PROTEIN: 7.6 g/dL (ref 6.0–8.3)
Total Bilirubin: 0.3 mg/dL (ref 0.2–1.2)

## 2014-01-17 LAB — HEMOGLOBIN A1C: Hgb A1c MFr Bld: 9.4 % — ABNORMAL HIGH (ref 4.6–6.5)

## 2014-01-17 LAB — TSH: TSH: 1.05 u[IU]/mL (ref 0.35–4.50)

## 2014-01-17 NOTE — Patient Instructions (Signed)
Schedule your complete physical in 3-4 months (we'll also recheck diabetes) We'll notify you of your lab results and make any changes if needed Try and make healthy food choices and get regular exercise Schedule your eye exam for November Call with any questions or concerns Have a great fall!!

## 2014-01-17 NOTE — Assessment & Plan Note (Signed)
Chronic problem.  Adequate control.  Asymptomatic w/ exception of blurry vision- pt has eye exam upcoming.  Check labs.  No anticipated med changes.

## 2014-01-17 NOTE — Assessment & Plan Note (Signed)
Chronic problem.  Tolerating statin w/o difficulty.  Check labs.  Adjust meds prn  

## 2014-01-17 NOTE — Progress Notes (Signed)
   Subjective:    Patient ID: Dana Payne, female    DOB: January 12, 1966, 48 y.o.   MRN: [id number]  HPI DM- chronic problem, on Metformin.  Pt not eating a low carb diet, in fact, has been eating more than usual.  UTD on eye exam.  Mild tingling in hands but this is unchanged from previous.  Denies symptomatic lows, N/V/D.  HTN- chronic problem, on Lisinopril-HCTZ.  Adequate control.  No CP, SOB, HAs, edema.  + blurry vision  Hyperlipidemia- chronic problem, on Simvastatin.  Denies abd pain, N/V, myalgias   Review of Systems For ROS see HPI     Objective:   Physical Exam  Vitals reviewed. Constitutional: She is oriented to person, place, and time. She appears well-developed and well-nourished. No distress.  HENT:  Head: Normocephalic and atraumatic.  Eyes: Conjunctivae and EOM are normal. Pupils are equal, round, and reactive to light.  Neck: Normal range of motion. Neck supple. No thyromegaly present.  Cardiovascular: Normal rate, regular rhythm, normal heart sounds and intact distal pulses.   No murmur heard. Pulmonary/Chest: Effort normal and breath sounds normal. No respiratory distress.  Abdominal: Soft. She exhibits no distension. There is no tenderness.  Musculoskeletal: She exhibits no edema.  Lymphadenopathy:    She has no cervical adenopathy.  Neurological: She is alert and oriented to person, place, and time.  Skin: Skin is warm and dry.  Psychiatric: She has a normal mood and affect. Her behavior is normal.          Assessment & Plan:

## 2014-01-17 NOTE — Assessment & Plan Note (Signed)
Chronic problem, UTD on eye exam.  Known neuropathy of hands.  Pt reports very poor diet and no exercise.  Stressed importance of both.  Check labs.  Adjust meds prn

## 2014-01-17 NOTE — Progress Notes (Signed)
Pre visit review using our clinic review tool, if applicable. No additional management support is needed unless otherwise documented below in the visit note. 

## 2014-01-24 ENCOUNTER — Encounter: Payer: Self-pay | Admitting: General Practice

## 2014-01-24 ENCOUNTER — Other Ambulatory Visit: Payer: Self-pay | Admitting: General Practice

## 2014-01-24 MED ORDER — SITAGLIPTIN PHOSPHATE 100 MG PO TABS: 100.0000 mg | ORAL_TABLET | Freq: Every day | ORAL | Status: DC

## 2014-01-30 ENCOUNTER — Telehealth: Payer: Self-pay | Admitting: Family Medicine

## 2014-01-30 MED ORDER — METFORMIN HCL ER 500 MG PO TB24: ORAL_TABLET | ORAL | Status: DC

## 2014-01-30 NOTE — Telephone Encounter (Signed)
Pt notified of lab results. Medication filled.

## 2014-01-30 NOTE — Telephone Encounter (Signed)
Caller name: Hayla  Relation to pt: self  Call back number: [phone]   Reason for call:   pt requesting a refill metFORMIN (GLUCOPHAGE-XR) 500 MG 24 hr tablet   Pt would like to know her results from last visit 01/17/14  And would like to know vitals as well from last OV. Pt stated please leave detail message

## 2014-01-30 NOTE — Telephone Encounter (Signed)
error 

## 2014-02-07 ENCOUNTER — Ambulatory Visit (INDEPENDENT_AMBULATORY_CARE_PROVIDER_SITE_OTHER): Payer: BC Managed Care – PPO | Admitting: Gynecology

## 2014-02-07 ENCOUNTER — Encounter: Payer: Self-pay | Admitting: Gynecology

## 2014-02-07 VITALS — BP 115/78 | Ht 65.0 in | Wt 167.0 lb

## 2014-02-07 DIAGNOSIS — Z23 Encounter for immunization: Secondary | ICD-10-CM

## 2014-02-07 DIAGNOSIS — Z780 Asymptomatic menopausal state: Secondary | ICD-10-CM

## 2014-02-07 DIAGNOSIS — Z01419 Encounter for gynecological examination (general) (routine) without abnormal findings: Secondary | ICD-10-CM

## 2014-02-07 NOTE — Addendum Note (Signed)
Addended by: Burnett Kanaris on: 02/07/2014 01:00 PM   Modules accepted: Orders

## 2014-02-07 NOTE — Progress Notes (Signed)
Dana Payne [birth year] [id number]   History:    48 y.o.  for annual gyn exam with no complaints today. Patient last year with suffering from vasomotor symptoms and her [facility] indicated she was in the menopausal range. She had been prescribed Elestrin transdermal estrogen which she used for short time and is having no vasomotor symptoms. Patient declined flu vaccine today.She is being followed by Dr. Birdie Riddle who is monitoingr her hypertension, hypercholesterolemia, and type 2 diabetes. Her PCP is doing her blood work.Patient with prior history of total abdominal hysterectomy.She had a past history of CO2 laser of her cervix by another provider many years ago and I do not have the grade of dysplasia report but her Pap smears have been normal the past 3 years here in our office.Patient stated in 2012 she had a colonoscopy because of rectal bleeding and there was some form of colonic inflammation but no polyps. She did mention that she has an aunt with colon cancer an uncle with colon polyps in her father was recently diagnosed with colon polyps.   Past medical history,surgical history, family history and social history were all reviewed and documented in the EPIC chart.  Gynecologic History No LMP recorded. Patient has had a hysterectomy. Contraception: status post hysterectomy Last Pap:  2014 . Results were: normal Last mammogram:  2014 . Results were: normal  Obstetric History OB History  Gravida Para Term Preterm AB SAB TAB Ectopic Multiple Living  0                  ROS: A ROS was performed and pertinent positives and negatives are included in the history.  GENERAL: No fevers or chills. HEENT: No change in vision, no earache, sore throat or sinus congestion. NECK: No pain or stiffness. CARDIOVASCULAR: No chest pain or pressure. No palpitations. PULMONARY: No shortness of breath, cough or wheeze. GASTROINTESTINAL: No abdominal pain, nausea, vomiting or diarrhea, melena or bright red blood  per rectum. GENITOURINARY: No urinary frequency, urgency, hesitancy or dysuria. MUSCULOSKELETAL: No joint or muscle pain, no back pain, no recent trauma. DERMATOLOGIC: No rash, no itching, no lesions. ENDOCRINE: No polyuria, polydipsia, no heat or cold intolerance. No recent change in weight. HEMATOLOGICAL: No anemia or easy bruising or bleeding. NEUROLOGIC: No headache, seizures, numbness, tingling or weakness. PSYCHIATRIC: No depression, no loss of interest in normal activity or change in sleep pattern.     Exam: chaperone present  BP 115/78  Ht 5\' 5"  (1.651 m)  Wt 167 lb (75.751 kg)  BMI 27.79 kg/m2  Body mass index is 27.79 kg/(m^2).  General appearance : Well developed well nourished female. No acute distress HEENT: Neck supple, trachea midline, no carotid bruits, no thyroidmegaly Lungs: Clear to auscultation, no rhonchi or wheezes, or rib retractions  Heart: Regular rate and rhythm, no murmurs or gallops Breast:Examined in sitting and supine position were symmetrical in appearance, no palpable masses or tenderness,  no skin retraction, no nipple inversion, no nipple discharge, no skin discoloration, no axillary or supraclavicular lymphadenopathy Abdomen: no palpable masses or tenderness, no rebound or guarding Extremities: no edema or skin discoloration or tenderness  Pelvic:  Bartholin, Urethra, Skene Glands: Within normal limits             Vagina: No gross lesions or discharge  Cervix:  absent   Uterus   absent   Adnexa  Without masses or tenderness  Anus and perineum  normal   Rectovaginal  normal sphincter tone without palpated masses  or tenderness             Hemoccult Cards provided      Assessment/Plan:  48 y.o. female for annual exam With history of dysplasia many years ago. Last 3 Pap smears here in our office yearly have been normal. We will now begin to follow with Pap smears every 3 years as per the new guidelines. She was reminded to schedule her mammogram. We  discussed importance of monthly breast exams. We discussed importance of calcium and vitamin D and regular exercise for osteoporosis prevention. Patient will need a colonoscopy next year. She was reminded to submit to the office the fecal Hemoccult cards for testing. Patient did refuse the flu vaccine today. She did receive the Tdap vaccine today.    Terrance Mass MD, 11:39 AM 02/07/2014

## 2014-02-07 NOTE — Patient Instructions (Signed)

## 2014-03-01 ENCOUNTER — Encounter: Payer: Self-pay | Admitting: Gynecology

## 2014-03-19 ENCOUNTER — Other Ambulatory Visit: Payer: Self-pay | Admitting: General Practice

## 2014-03-19 MED ORDER — SIMVASTATIN 40 MG PO TABS: 40.0000 mg | ORAL_TABLET | Freq: Every day | ORAL | Status: DC

## 2014-03-28 LAB — HM DIABETES EYE EXAM

## 2014-04-02 ENCOUNTER — Encounter: Payer: Self-pay | Admitting: General Practice

## 2014-06-04 ENCOUNTER — Other Ambulatory Visit: Payer: Self-pay | Admitting: Family Medicine

## 2014-06-04 NOTE — Telephone Encounter (Signed)
Med filled letter mailed to pt to schedule a physical.

## 2014-06-14 ENCOUNTER — Ambulatory Visit (INDEPENDENT_AMBULATORY_CARE_PROVIDER_SITE_OTHER): Payer: BLUE CROSS/BLUE SHIELD | Admitting: Family Medicine

## 2014-06-14 ENCOUNTER — Encounter: Payer: Self-pay | Admitting: Family Medicine

## 2014-06-14 ENCOUNTER — Encounter: Payer: Self-pay | Admitting: General Practice

## 2014-06-14 VITALS — BP 114/80 | HR 70 | Temp 97.9°F | Resp 16 | Wt 160.2 lb

## 2014-06-14 DIAGNOSIS — G629 Polyneuropathy, unspecified: Secondary | ICD-10-CM

## 2014-06-14 DIAGNOSIS — E1142 Type 2 diabetes mellitus with diabetic polyneuropathy: Secondary | ICD-10-CM

## 2014-06-14 LAB — BASIC METABOLIC PANEL
BUN: 10 mg/dL (ref 6–23)
CO2: 31 mEq/L (ref 19–32)
CREATININE: 0.77 mg/dL (ref 0.40–1.20)
Calcium: 9.8 mg/dL (ref 8.4–10.5)
Chloride: 101 mEq/L (ref 96–112)
GFR: 102.22 mL/min (ref >60.00)
Glucose, Bld: 131 mg/dL — ABNORMAL HIGH (ref 70–99)
Potassium: 4.2 mEq/L (ref 3.5–5.1)
Sodium: 138 mEq/L (ref 135–145)

## 2014-06-14 LAB — HEMOGLOBIN A1C: HEMOGLOBIN A1C: 7.9 % — AB (ref 4.6–6.5)

## 2014-06-14 NOTE — Assessment & Plan Note (Signed)
Chronic problem.  Per pt report, CBGs are mildly elevated.  UTD on eye exam.  Continued neuropathy but this is unchanged from previous.  Limited exercise and frequent non-compliance w/ low carb diet.  Stressed need for her to work on both.  On ACE for renal protection.  Check labs.  Adjust meds prn.

## 2014-06-14 NOTE — Patient Instructions (Signed)
Schedule your complete physical in 3-4 months We'll notify you of your lab results and make any changes if needed Try and make healthy food choices and regular exercise Call with any questions or concerns Happy Spring!!

## 2014-06-14 NOTE — Progress Notes (Signed)
   Subjective:    Patient ID: Dana Payne, female    DOB: 04/19/1966, 49 y.o.   MRN: [id number]  HPI DM- chronic problem.  Currently on Metformin 1000mg  daily.  Did not start Januvia.  UTD on eye exam.  Checking sugars infrequently- last CBG 165.  Pt reports CBGs will be higher if she works late and subsequently needs to eat late.  No CP, SOB, HAs, visual changes, edema.  + numbness and tingling bilaterally in feet- unchanged from previous.  On Lisinopril for renal protection.   Review of Systems For ROS see HPI     Objective:   Physical Exam  Constitutional: She is oriented to person, place, and time. She appears well-developed and well-nourished. No distress.  HENT:  Head: Normocephalic and atraumatic.  Eyes: Conjunctivae and EOM are normal. Pupils are equal, round, and reactive to light.  Neck: Normal range of motion. Neck supple. No thyromegaly present.  Cardiovascular: Normal rate, regular rhythm, normal heart sounds and intact distal pulses.   No murmur heard. Pulmonary/Chest: Effort normal and breath sounds normal. No respiratory distress.  Abdominal: Soft. She exhibits no distension. There is no tenderness.  Musculoskeletal: She exhibits no edema.  Lymphadenopathy:    She has no cervical adenopathy.  Neurological: She is alert and oriented to person, place, and time.  Skin: Skin is warm and dry.  Psychiatric: She has a normal mood and affect. Her behavior is normal.  Vitals reviewed.         Assessment & Plan:

## 2014-06-14 NOTE — Progress Notes (Signed)
Pre visit review using our clinic review tool, if applicable. No additional management support is needed unless otherwise documented below in the visit note. 

## 2014-07-06 ENCOUNTER — Other Ambulatory Visit: Payer: Self-pay | Admitting: Family Medicine

## 2014-07-08 NOTE — Telephone Encounter (Signed)
Med filled.  

## 2014-07-24 ENCOUNTER — Other Ambulatory Visit: Payer: Self-pay | Admitting: Family Medicine

## 2014-07-24 NOTE — Telephone Encounter (Signed)
Med filled.  

## 2014-08-06 ENCOUNTER — Other Ambulatory Visit: Payer: Self-pay | Admitting: Family Medicine

## 2014-08-06 NOTE — Telephone Encounter (Signed)
Med filled.  

## 2014-10-03 ENCOUNTER — Encounter (HOSPITAL_COMMUNITY): Payer: Self-pay | Admitting: Emergency Medicine

## 2014-10-03 ENCOUNTER — Emergency Department (HOSPITAL_COMMUNITY)
Admission: EM | Admit: 2014-10-03 | Discharge: 2014-10-03 | Disposition: A | Discharge status: Home / Self Care | Payer: BLUE CROSS/BLUE SHIELD | Attending: Emergency Medicine | Admitting: Emergency Medicine

## 2014-10-03 DIAGNOSIS — Z79899 Other long term (current) drug therapy: Secondary | ICD-10-CM | POA: Diagnosis not present

## 2014-10-03 DIAGNOSIS — I10 Essential (primary) hypertension: Secondary | ICD-10-CM | POA: Insufficient documentation

## 2014-10-03 DIAGNOSIS — G43909 Migraine, unspecified, not intractable, without status migrainosus: Secondary | ICD-10-CM | POA: Diagnosis not present

## 2014-10-03 DIAGNOSIS — X58XXXA Exposure to other specified factors, initial encounter: Secondary | ICD-10-CM | POA: Insufficient documentation

## 2014-10-03 DIAGNOSIS — S39012A Strain of muscle, fascia and tendon of lower back, initial encounter: Secondary | ICD-10-CM | POA: Insufficient documentation

## 2014-10-03 DIAGNOSIS — Z8719 Personal history of other diseases of the digestive system: Secondary | ICD-10-CM | POA: Insufficient documentation

## 2014-10-03 DIAGNOSIS — Y998 Other external cause status: Secondary | ICD-10-CM | POA: Insufficient documentation

## 2014-10-03 DIAGNOSIS — Y92009 Unspecified place in unspecified non-institutional (private) residence as the place of occurrence of the external cause: Secondary | ICD-10-CM | POA: Insufficient documentation

## 2014-10-03 DIAGNOSIS — Y9389 Activity, other specified: Secondary | ICD-10-CM | POA: Diagnosis not present

## 2014-10-03 DIAGNOSIS — Z7982 Long term (current) use of aspirin: Secondary | ICD-10-CM | POA: Diagnosis not present

## 2014-10-03 DIAGNOSIS — E119 Type 2 diabetes mellitus without complications: Secondary | ICD-10-CM | POA: Insufficient documentation

## 2014-10-03 DIAGNOSIS — E780 Pure hypercholesterolemia: Secondary | ICD-10-CM | POA: Diagnosis not present

## 2014-10-03 DIAGNOSIS — S3992XA Unspecified injury of lower back, initial encounter: Secondary | ICD-10-CM | POA: Diagnosis present

## 2014-10-03 LAB — URINALYSIS, ROUTINE W REFLEX MICROSCOPIC
Bilirubin Urine: NEGATIVE
GLUCOSE, UA: NEGATIVE mg/dL
HGB URINE DIPSTICK: NEGATIVE
Ketones, ur: NEGATIVE mg/dL
Leukocytes, UA: NEGATIVE
Nitrite: NEGATIVE
PROTEIN: NEGATIVE mg/dL
Urobilinogen, UA: 0.2 mg/dL (ref 0.0–1.0)
pH: 5.5 (ref 5.0–8.0)

## 2014-10-03 LAB — CBG MONITORING, ED: Glucose-Capillary: 195 mg/dL — ABNORMAL HIGH (ref 65–99)

## 2014-10-03 MED ORDER — HYDROCODONE-ACETAMINOPHEN 5-325 MG PO TABS
1.0000 | ORAL_TABLET | ORAL | Status: DC | PRN
Start: 2014-10-03 — End: 2015-03-18

## 2014-10-03 MED ORDER — METHOCARBAMOL 500 MG PO TABS: 1000.0000 mg | ORAL_TABLET | Freq: Four times a day (QID) | ORAL | Status: AC

## 2014-10-03 MED ORDER — IBUPROFEN 600 MG PO TABS: 600.0000 mg | ORAL_TABLET | Freq: Four times a day (QID) | ORAL | Status: DC | PRN

## 2014-10-03 NOTE — Discharge Instructions (Signed)
Lumbosacral Strain Lumbosacral strain is a strain of any of the parts that make up your lumbosacral vertebrae. Your lumbosacral vertebrae are the bones that make up the lower third of your backbone. Your lumbosacral vertebrae are held together by muscles and tough, fibrous tissue (ligaments).  CAUSES  A sudden blow to your back can cause lumbosacral strain. Also, anything that causes an excessive stretch of the muscles in the low back can cause this strain. This is typically seen when people exert themselves strenuously, fall, lift heavy objects, bend, or crouch repeatedly. RISK FACTORS  Physically demanding work.  Participation in pushing or pulling sports or sports that require a sudden twist of the back (tennis, golf, baseball).  Weight lifting.  Excessive lower back curvature.  Forward-tilted pelvis.  Weak back or abdominal muscles or both.  Tight hamstrings. SIGNS AND SYMPTOMS  Lumbosacral strain may cause pain in the area of your injury or pain that moves (radiates) down your leg.  DIAGNOSIS Your health care provider can often diagnose lumbosacral strain through a physical exam. In some cases, you may need tests such as X-ray exams.  TREATMENT  Treatment for your lower back injury depends on many factors that your clinician will have to evaluate. However, most treatment will include the use of anti-inflammatory medicines. HOME CARE INSTRUCTIONS   Avoid hard physical activities (tennis, racquetball, waterskiing) if you are not in proper physical condition for it. This may aggravate or create problems.  If you have a back problem, avoid sports requiring sudden body movements. Swimming and walking are generally safer activities.  Maintain good posture.  Maintain a healthy weight.  For acute conditions, you may put ice on the injured area.  Put ice in a plastic bag.  Place a towel between your skin and the bag.  Leave the ice on for 20 minutes, 2-3 times a day.  When the  low back starts healing, stretching and strengthening exercises may be recommended. SEEK MEDICAL CARE IF:  Your back pain is getting worse.  You experience severe back pain not relieved with medicines. SEEK IMMEDIATE MEDICAL CARE IF:   You have numbness, tingling, weakness, or problems with the use of your arms or legs.  There is a change in bowel or bladder control.  You have increasing pain in any area of the body, including your belly (abdomen).  You notice shortness of breath, dizziness, or feel faint.  You feel sick to your stomach (nauseous), are throwing up (vomiting), or become sweaty.  You notice discoloration of your toes or legs, or your feet get very cold. MAKE SURE YOU:   Understand these instructions.  Will watch your condition.  Will get help right away if you are not doing well or get worse. Document Released: 01/20/2005 Document Revised: 04/17/2013 Document Reviewed: 11/29/2012 [facility] Patient Information 2015 [place], [place]. This information is not intended to replace advice given to you by your health care provider. Make sure you discuss any questions you have with your health care provider.   Do not drive within 4 hours of taking hydrocodone or robaxin as these will make you drowsy.  Avoid lifting,  Bending,  Twisting or any other activity that worsens your pain over the next week.  Apply an  icepack  to your lower back for 10-15 minutes every 2 hours for the next 2 days.  You should get rechecked if your symptoms are not better over the next 5 days,  Or you develop increased pain,  Weakness in your leg(s)  or loss of bladder or bowel function - these are symptoms of a worse injury.

## 2014-10-03 NOTE — ED Notes (Signed)
PT c/o lower back pain x5 days with no reported injury. PT ambulatory in triage with NAD noted.

## 2014-10-04 ENCOUNTER — Telehealth: Payer: Self-pay | Admitting: Family Medicine

## 2014-10-04 NOTE — Telephone Encounter (Signed)
Relation to pt: self  Call back number: [phone]   Reason for call:  Pt would like sugar levels checked, please advise

## 2014-10-04 NOTE — ED Provider Notes (Signed)
CSN: [id number]     Arrival date & time 10/03/14  1421 History   First MD Initiated Contact with Patient 10/03/14 1600     Chief Complaint  Patient presents with  . Back Pain     (Consider location/radiation/quality/duration/timing/severity/associated sxs/prior Treatment) The history is provided by the patient.   Dana Payne is a 49 y.o. female with a past medical history of htn and diabetes presenting with a 5 day history of left lower back pain without known injury. She does state 2 days before she woke with discomfort she has slid a dresser and flipped a mattress in her home but had no indication of injury until 2 days later. She denies weakness or numbness in her legs and denies urinary or fecal incontinence or retention but has noticed increased urinary frequency, no dysuria, fevers, chills, hematuria.  Her pain is worsened with palpation and certain positions and movement. She has found no alleviators for her pain.     Past Medical History  Diagnosis Date  . GERD (gastroesophageal reflux disease)   . HTN (hypertension)   . Hypercholesterolemia   . Diabetes mellitus   . Migraines   . Hypercholesteremia   . Type II diabetes mellitus    Past Surgical History  Procedure Laterality Date  . Partial hysterectomy    . Laser ablation of the cervix      dysplasia  . Colonoscopy  2012   Family History  Problem Relation Age of Onset  . Colon polyps Maternal Aunt   . Colon polyps Paternal Uncle   . Colon cancer Maternal Aunt     in 16s when diagnosed.   Marland Kitchen Heart disease Father    History  Substance Use Topics  . Smoking status: Never Smoker   . Smokeless tobacco: Never Used  . Alcohol Use: No   OB History    Gravida Para Term Preterm AB TAB SAB Ectopic Multiple Living   0         0     Review of Systems  Constitutional: Negative for fever.  Respiratory: Negative for shortness of breath.   Cardiovascular: Negative for chest pain and leg swelling.  Gastrointestinal:  Negative for abdominal pain, constipation and abdominal distention.  Genitourinary: Negative for dysuria, urgency, frequency, flank pain and difficulty urinating.  Musculoskeletal: Positive for back pain. Negative for joint swelling and gait problem.  Skin: Negative for rash.  Neurological: Negative for weakness and numbness.      Allergies  Review of patient's allergies indicates no known allergies.  Home Medications   Prior to Admission medications   Medication Sig Start Date End Date Taking? Authorizing Provider  aspirin EC 81 MG tablet Take 81 mg by mouth daily.   Yes Historical Provider, MD  lisinopril-hydrochlorothiazide (PRINZIDE,ZESTORETIC) 20-12.5 MG per tablet TAKE ONE TABLET BY MOUTH TWICE DAILY 01/11/14  Yes [name], MD  metFORMIN (GLUCOPHAGE-XR) 500 MG 24 hr tablet TAKE TWO TABLETS BY MOUTH ONCE DAILY WITH BREAKFAST 08/06/14  Yes [name], MD  simvastatin (ZOCOR) 40 MG tablet TAKE ONE TABLET BY MOUTH AT BEDTIME 07/24/14  Yes [name], MD  HYDROcodone-acetaminophen (NORCO/VICODIN) 5-325 MG per tablet Take 1 tablet by mouth every 4 (four) hours as needed. 10/03/14   Evalee Jefferson, PA-C  ibuprofen (ADVIL,MOTRIN) 600 MG tablet Take 1 tablet (600 mg total) by mouth every 6 (six) hours as needed. 10/03/14   Evalee Jefferson, PA-C  methocarbamol (ROBAXIN) 500 MG tablet Take 2 tablets (1,000 mg total) by  mouth 4 (four) times daily. 10/03/14 10/13/14  Evalee Jefferson, PA-C  ONE TOUCH ULTRA TEST test strip USE TO TEST BLOOD SUGAR TWICE DAILY 07/18/10   Alferd Apa Lowne, DO  sitaGLIPtin (JANUVIA) 100 MG tablet Take 1 tablet (100 mg total) by mouth daily. Patient not taking: Reported on 06/14/2014 01/24/14   [name], MD  triamcinolone (NASACORT) 55 MCG/ACT nasal inhaler Place 2 sprays into the nose daily as needed (sinus).     Historical Provider, MD   BP 122/63 mmHg  Pulse 96  Temp(Src) 98.1 F (36.7 C) (Oral)  Resp 20  Ht 5\' 5"  (1.651 m)  Wt 168 lb (76.204 kg)  BMI  27.96 kg/m2  SpO2 100% Physical Exam  Constitutional: She appears well-developed and well-nourished.  HENT:  Head: Normocephalic.  Eyes: Conjunctivae are normal.  Neck: Normal range of motion. Neck supple.  Cardiovascular: Normal rate and intact distal pulses.   Pedal pulses normal.  Pulmonary/Chest: Effort normal.  Abdominal: Soft. Bowel sounds are normal. She exhibits no distension and no mass.  Musculoskeletal: Normal range of motion. She exhibits no edema.       Lumbar back: She exhibits tenderness. She exhibits no bony tenderness, no swelling, no edema and no spasm.       Back:  ttp left paralumbar soft tissue, no midline pain.  Neurological: She is alert. She has normal strength. She displays no atrophy and no tremor. No sensory deficit. Gait normal.  Reflex Scores:      Patellar reflexes are 2+ on the right side and 2+ on the left side.      Achilles reflexes are 2+ on the right side and 2+ on the left side. No strength deficit noted in hip and knee flexor and extensor muscle groups.  Ankle flexion and extension intact.  Skin: Skin is warm and dry.  Psychiatric: She has a normal mood and affect.  Nursing note and vitals reviewed.   ED Course  Procedures (including critical care time) Labs Review Labs Reviewed  URINALYSIS, ROUTINE W REFLEX MICROSCOPIC (NOT AT [facility]) - Abnormal; Notable for the following:    Specific Gravity, Urine >1.030 (*)    All other components within normal limits  CBG MONITORING, ED - Abnormal; Notable for the following:    Glucose-Capillary 195 (*)    All other components within normal limits    Imaging Review No results found.   EKG Interpretation None      MDM   Final diagnoses:  Lumbar strain, initial encounter    Patients labs and/or radiological studies were reviewed and considered during the medical decision making and disposition process.  Results were also discussed with patient. Pt advised heat, activity as tolerated,  prescribed ibuprofen, robaxin, hydrocodone. Advised f/u with pcp for a recheck if not improved with tx.  No neuro deficit on exam or by history to suggest emergent or surgical presentation. Discussed worsened sx that should prompt immediate re-evaluation including distal weakness, bowel/bladder retention/incontinence.  Pain reproducible, c/w musculoskeletal source.          Evalee Jefferson, PA-C 10/04/14 2212  Dorie Rank, MD 10/08/14 [phone]

## 2014-10-07 NOTE — Telephone Encounter (Signed)
LVM advising pt to schedule diabetic follow up

## 2014-10-07 NOTE — Telephone Encounter (Signed)
Pt needs a diabetic follow up with [facility].

## 2014-10-17 ENCOUNTER — Telehealth: Payer: Self-pay | Admitting: Family Medicine

## 2014-10-17 NOTE — Telephone Encounter (Signed)
pre visit letter mailed 10/17/14

## 2014-11-06 ENCOUNTER — Telehealth: Payer: Self-pay | Admitting: *Deleted

## 2014-11-06 NOTE — Telephone Encounter (Signed)
Unable to reach patient at time of Pre-Visit Call.  Person who answered phone stated patient was at work and unavailable to talk but he will remind her of physical tomorrow.

## 2014-11-07 ENCOUNTER — Encounter: Payer: Self-pay | Admitting: Family Medicine

## 2014-11-07 ENCOUNTER — Ambulatory Visit (INDEPENDENT_AMBULATORY_CARE_PROVIDER_SITE_OTHER): Payer: BLUE CROSS/BLUE SHIELD | Admitting: Family Medicine

## 2014-11-07 VITALS — BP 120/68 | HR 88 | Temp 98.0°F | Resp 16 | Ht 65.0 in | Wt 164.1 lb

## 2014-11-07 DIAGNOSIS — G629 Polyneuropathy, unspecified: Secondary | ICD-10-CM

## 2014-11-07 DIAGNOSIS — E1142 Type 2 diabetes mellitus with diabetic polyneuropathy: Secondary | ICD-10-CM | POA: Diagnosis not present

## 2014-11-07 DIAGNOSIS — Z0000 Encounter for general adult medical examination without abnormal findings: Secondary | ICD-10-CM

## 2014-11-07 LAB — LIPID PANEL
Cholesterol: 120 mg/dL (ref 0–200)
HDL: 38.9 mg/dL — ABNORMAL LOW (ref >39.00)
LDL CALC: 65 mg/dL (ref 0–99)
NonHDL: 81.1
Total CHOL/HDL Ratio: 3
Triglycerides: 81 mg/dL (ref 0.0–149.0)
VLDL: 16.2 mg/dL (ref 0.0–40.0)

## 2014-11-07 LAB — CBC WITH DIFFERENTIAL/PLATELET
BASOS PCT: 0.4 % (ref 0.0–3.0)
Basophils Absolute: 0 10*3/uL (ref 0.0–0.1)
EOS PCT: 1.5 % (ref 0.0–5.0)
Eosinophils Absolute: 0.1 10*3/uL (ref 0.0–0.7)
HEMATOCRIT: 42.1 % (ref 36.0–46.0)
HEMOGLOBIN: 14.2 g/dL (ref 12.0–15.0)
Lymphocytes Relative: 31.7 % (ref 12.0–46.0)
Lymphs Abs: 1.9 10*3/uL (ref 0.7–4.0)
MCHC: 33.8 g/dL (ref 30.0–36.0)
MCV: 86.9 fl (ref 78.0–100.0)
MONOS PCT: 7.2 % (ref 3.0–12.0)
Monocytes Absolute: 0.4 10*3/uL (ref 0.1–1.0)
NEUTROS PCT: 59.2 % (ref 43.0–77.0)
Neutro Abs: 3.6 10*3/uL (ref 1.4–7.7)
Platelets: 257 10*3/uL (ref 150.0–400.0)
RBC: 4.84 Mil/uL (ref 3.87–5.11)
RDW: 12.9 % (ref 11.5–15.5)
WBC: 6 10*3/uL (ref 4.0–10.5)

## 2014-11-07 LAB — TSH: TSH: 1.2 u[IU]/mL (ref 0.35–4.50)

## 2014-11-07 LAB — BASIC METABOLIC PANEL
BUN: 11 mg/dL (ref 6–23)
CO2: 33 mEq/L — ABNORMAL HIGH (ref 19–32)
Calcium: 9.9 mg/dL (ref 8.4–10.5)
Chloride: 99 mEq/L (ref 96–112)
Creatinine, Ser: 0.79 mg/dL (ref 0.40–1.20)
GFR: 99.08 mL/min (ref >60.00)
Glucose, Bld: 153 mg/dL — ABNORMAL HIGH (ref 70–99)
Potassium: 3.8 mEq/L (ref 3.5–5.1)
Sodium: 138 mEq/L (ref 135–145)

## 2014-11-07 LAB — HEPATIC FUNCTION PANEL
ALT: 24 U/L (ref 0–35)
AST: 18 U/L (ref 0–37)
Albumin: 4.7 g/dL (ref 3.5–5.2)
Alkaline Phosphatase: 84 U/L (ref 39–117)
BILIRUBIN TOTAL: 0.5 mg/dL (ref 0.2–1.2)
Bilirubin, Direct: 0.1 mg/dL (ref 0.0–0.3)
TOTAL PROTEIN: 7.5 g/dL (ref 6.0–8.3)

## 2014-11-07 LAB — VITAMIN D 25 HYDROXY (VIT D DEFICIENCY, FRACTURES): VITD: 8.64 ng/mL — ABNORMAL LOW (ref 30.00–100.00)

## 2014-11-07 LAB — HEMOGLOBIN A1C: HEMOGLOBIN A1C: 8.1 % — AB (ref 4.6–6.5)

## 2014-11-07 NOTE — Progress Notes (Signed)
   Subjective:    Patient ID: Dana Payne, female    DOB: 1965/12/02, 49 y.o.   MRN: [id number]  HPI CPE- UTD on GYN w/ Dr Toney Rakes.  UTD on colonoscopy- due next year for 5 yr call back w/ Dr Sydell Axon   Review of Systems Patient reports no vision/ hearing changes, adenopathy,fever, weight change,  persistant/recurrent hoarseness , swallowing issues, chest pain, palpitations, edema, persistant/recurrent cough, hemoptysis, dyspnea (rest/exertional/paroxysmal nocturnal), gastrointestinal bleeding (melena, rectal bleeding), abdominal pain, significant heartburn, bowel changes, GU symptoms (dysuria, hematuria, incontinence), Gyn symptoms (abnormal  bleeding, pain),  syncope, focal weakness, memory loss, skin/hair/nail changes, abnormal bruising or bleeding, anxiety, or depression.  + numbness and tingling due to neuropathy     Objective:   Physical Exam General Appearance:    Alert, cooperative, no distress, appears stated age  Head:    Normocephalic, without obvious abnormality, atraumatic  Eyes:    PERRL, conjunctiva/corneas clear, EOM's intact, fundi    benign, both eyes  Ears:    Normal TM's and external ear canals, both ears  Nose:   Nares normal, septum midline, mucosa normal, no drainage    or sinus tenderness  Throat:   Lips, mucosa, and tongue normal; teeth and gums normal  Neck:   Supple, symmetrical, trachea midline, no adenopathy;    Thyroid: multinodular goiter  Back:     Symmetric, no curvature, ROM normal, no CVA tenderness  Lungs:     Clear to auscultation bilaterally, respirations unlabored  Chest Wall:    No tenderness or deformity   Heart:    Regular rate and rhythm, S1 and S2 normal, no murmur, rub   or gallop  Breast Exam:    Deferred to GYN  Abdomen:     Soft, non-tender, bowel sounds active all four quadrants,    no masses, no organomegaly  Genitalia:    Deferred to GYN  Rectal:    Extremities:   Extremities normal, atraumatic, no cyanosis or edema  Pulses:    2+ and symmetric all extremities  Skin:   Skin color, texture, turgor normal, no rashes or lesions  Lymph nodes:   Cervical, supraclavicular, and axillary nodes normal  Neurologic:   CNII-XII intact, normal strength, sensation and reflexes    throughout          Assessment & Plan:

## 2014-11-07 NOTE — Progress Notes (Signed)
Pre visit review using our clinic review tool, if applicable. No additional management support is needed unless otherwise documented below in the visit note. 

## 2014-11-07 NOTE — Patient Instructions (Signed)
Follow up in 3-4 months to recheck diabetes We'll notify you of your lab results and make any changes if needed Try and make healthy food choice and get regular exercise Call with any questions or concerns Have a great summer!!!

## 2014-11-08 ENCOUNTER — Other Ambulatory Visit: Payer: Self-pay | Admitting: General Practice

## 2014-11-08 MED ORDER — VITAMIN D (ERGOCALCIFEROL) 1.25 MG (50000 UNIT) PO CAPS: 50000.0000 [IU] | ORAL_CAPSULE | ORAL | Status: DC

## 2014-11-08 MED ORDER — SITAGLIPTIN PHOSPHATE 100 MG PO TABS: 100.0000 mg | ORAL_TABLET | Freq: Every day | ORAL | Status: DC

## 2014-11-08 NOTE — Assessment & Plan Note (Signed)
Pt's PE WNL w/ exception of known thyromegaly.  UTD on GYN.  Check labs.  Anticipatory guidance provided.

## 2014-11-08 NOTE — Assessment & Plan Note (Signed)
Chronic problem.  Pt reports CBGs have not been well controlled.  UTD on eye exam.  Check labs.  Adjust meds prn.

## 2014-11-12 ENCOUNTER — Encounter: Payer: Self-pay | Admitting: General Practice

## 2015-02-18 ENCOUNTER — Telehealth: Payer: Self-pay | Admitting: *Deleted

## 2015-02-18 NOTE — Telephone Encounter (Signed)
Pt called c/o new breast discomfort of left breast requesting order placed at breast center, I explained to pt that OV with Dr.Fernandez will be needed for new problem. Pt did not want to schedule OV at this time states annual is on 03/18/15, I advised pt best not to wait for breast exam. Pt verbally understood all the above and no appointment was made.

## 2015-02-25 ENCOUNTER — Other Ambulatory Visit: Payer: Self-pay | Admitting: Family Medicine

## 2015-02-25 NOTE — Telephone Encounter (Signed)
Medication filled to pharmacy as requested.   

## 2015-03-03 ENCOUNTER — Encounter: Payer: Self-pay | Admitting: General Practice

## 2015-03-03 ENCOUNTER — Encounter: Payer: Self-pay | Admitting: Family Medicine

## 2015-03-03 ENCOUNTER — Ambulatory Visit (INDEPENDENT_AMBULATORY_CARE_PROVIDER_SITE_OTHER): Payer: BLUE CROSS/BLUE SHIELD | Admitting: Family Medicine

## 2015-03-03 VITALS — BP 122/72 | HR 77 | Temp 97.9°F | Resp 16 | Ht 65.0 in | Wt 166.4 lb

## 2015-03-03 DIAGNOSIS — R0789 Other chest pain: Secondary | ICD-10-CM | POA: Diagnosis not present

## 2015-03-03 DIAGNOSIS — R300 Dysuria: Secondary | ICD-10-CM | POA: Diagnosis not present

## 2015-03-03 DIAGNOSIS — E1142 Type 2 diabetes mellitus with diabetic polyneuropathy: Secondary | ICD-10-CM | POA: Diagnosis not present

## 2015-03-03 LAB — POCT URINALYSIS DIPSTICK
Bilirubin, UA: NEGATIVE
Blood, UA: NEGATIVE
Glucose, UA: NEGATIVE
Ketones, UA: NEGATIVE
LEUKOCYTES UA: NEGATIVE
Nitrite, UA: NEGATIVE
Protein, UA: NEGATIVE
Spec Grav, UA: 1.01
Urobilinogen, UA: 0.2
pH, UA: 6.5

## 2015-03-03 LAB — BASIC METABOLIC PANEL
BUN: 7 mg/dL (ref 6–23)
CO2: 28 mEq/L (ref 19–32)
Calcium: 10 mg/dL (ref 8.4–10.5)
Chloride: 101 mEq/L (ref 96–112)
Creatinine, Ser: 0.63 mg/dL (ref 0.40–1.20)
GFR: 128.49 mL/min (ref >60.00)
GLUCOSE: 164 mg/dL — AB (ref 70–99)
POTASSIUM: 3.7 meq/L (ref 3.5–5.1)
Sodium: 140 mEq/L (ref 135–145)

## 2015-03-03 LAB — HEMOGLOBIN A1C: Hgb A1c MFr Bld: 8.5 % — ABNORMAL HIGH (ref 4.6–6.5)

## 2015-03-03 MED ORDER — CEPHALEXIN 500 MG PO CAPS: 500.0000 mg | ORAL_CAPSULE | Freq: Two times a day (BID) | ORAL | Status: AC

## 2015-03-03 NOTE — Assessment & Plan Note (Signed)
New.  Pt's sxs are consistent w/ infxn despite normal UA.  Based on this, will start Keflex and send for cx.  Pt expressed understanding and is in agreement w/ plan.

## 2015-03-03 NOTE — Assessment & Plan Note (Signed)
New.  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  No abnormality on EKG today.  If sxs persist, pt is supposed to contact me so that I can refer to cards for a complete stress test.  Reviewed supportive care and red flags that should prompt return.  Pt expressed understanding and is in agreement w/ plan.

## 2015-03-03 NOTE — Progress Notes (Signed)
   Subjective:    Patient ID: Dana Payne, female    DOB: [birth year], 49 y.o.   MRN: [id number]  HPI DM- chronic problem, UTD on eye exam (due next month), UTD on foot exam.  On ACE for renal protection.  On Metformin, Januvia.  Denies symptomatic lows.  Pt reports sugars have been elevated- 250s-300.  Not eating well, not exercising.  + neuropathy of feet.  + CP- sharp pain radiating under L breast.  No SOB.  + HAs.  No abd pain, N/V.  Dysuria- sxs started ~1 week ago.  Increased frequency.  + urgency.  Sensation of incomplete emptying.  + fevers last week, LBP.  No CVA tenderness.  No current fever.   Review of Systems For ROS see HPI     Objective:   Physical Exam  Constitutional: She is oriented to person, place, and time. She appears well-developed and well-nourished. No distress.  HENT:  Head: Normocephalic and atraumatic.  Eyes: Conjunctivae and EOM are normal. Pupils are equal, round, and reactive to light.  Neck: Normal range of motion. Neck supple. No thyromegaly present.  Cardiovascular: Normal rate, regular rhythm, normal heart sounds and intact distal pulses.   No murmur heard. Pulmonary/Chest: Effort normal and breath sounds normal. No respiratory distress.  Abdominal: Soft. She exhibits no distension. There is no tenderness.  Musculoskeletal: She exhibits no edema.  Lymphadenopathy:    She has no cervical adenopathy.  Neurological: She is alert and oriented to person, place, and time.  Skin: Skin is warm and dry.  Psychiatric: She has a normal mood and affect. Her behavior is normal.  Vitals reviewed.         Assessment & Plan:

## 2015-03-03 NOTE — Patient Instructions (Signed)
Follow up in 3-4 months to recheck diabetes and cholesterol We'll notify you of your lab results and make any changes if needed Start the Keflex for a presumed UTI- your urine was fine here today but your symptoms are highly suspicious for infection Drink plenty of fluids Make sure you are eating a low carb diet and getting regular exercise as both a stress outlet and to lower your sugars Your chest pain is most likely stress or related to lifting at work- your EKG looks great If you continue to have chest discomfort- please let me know so we can refer you to cardiology for a stress test Call with any questions or concerns If you want to join [place] at the new [place] office, any scheduled appointments will automatically transfer and we will see you at [address] [place] [address] [place], [place] [address]  Happy Holidays!!!

## 2015-03-03 NOTE — Progress Notes (Signed)
Pre visit review using our clinic review tool, if applicable. No additional management support is needed unless otherwise documented below in the visit note. 

## 2015-03-03 NOTE — Assessment & Plan Note (Signed)
Chronic problem.  Pt admits to poor diet and no exercise- stressed the need for her to do better on both fronts.  UTD on eye exam, on ACE for renal protection.  Check labs.  Adjust meds prn

## 2015-03-04 ENCOUNTER — Encounter: Payer: Self-pay | Admitting: Gynecology

## 2015-03-04 LAB — URINE CULTURE

## 2015-03-05 ENCOUNTER — Other Ambulatory Visit: Payer: Self-pay

## 2015-03-05 MED ORDER — CANAGLIFLOZIN 100 MG PO TABS: 100.0000 mg | ORAL_TABLET | Freq: Every day | ORAL | Status: DC

## 2015-03-11 ENCOUNTER — Other Ambulatory Visit: Payer: Self-pay | Admitting: Family Medicine

## 2015-03-12 NOTE — Telephone Encounter (Signed)
Medication filled to pharmacy as requested.   

## 2015-03-18 ENCOUNTER — Encounter: Payer: Self-pay | Admitting: Gynecology

## 2015-03-18 ENCOUNTER — Ambulatory Visit (INDEPENDENT_AMBULATORY_CARE_PROVIDER_SITE_OTHER): Payer: BLUE CROSS/BLUE SHIELD | Admitting: Gynecology

## 2015-03-18 VITALS — BP 128/84 | Ht 65.0 in | Wt 169.0 lb

## 2015-03-18 DIAGNOSIS — N949 Unspecified condition associated with female genital organs and menstrual cycle: Secondary | ICD-10-CM

## 2015-03-18 DIAGNOSIS — Z01419 Encounter for gynecological examination (general) (routine) without abnormal findings: Secondary | ICD-10-CM

## 2015-03-18 DIAGNOSIS — Z780 Asymptomatic menopausal state: Secondary | ICD-10-CM | POA: Diagnosis not present

## 2015-03-18 DIAGNOSIS — N952 Postmenopausal atrophic vaginitis: Secondary | ICD-10-CM | POA: Diagnosis not present

## 2015-03-18 LAB — WET PREP FOR TRICH, YEAST, CLUE
CLUE CELLS WET PREP: NONE SEEN
Trich, Wet Prep: NONE SEEN
YEAST WET PREP: NONE SEEN

## 2015-03-18 MED ORDER — ESTRADIOL 10 MCG VA TABS: 1.0000 | ORAL_TABLET | VAGINAL | Status: DC

## 2015-03-18 MED ORDER — CLINDAMYCIN PHOSPHATE 2 % VA CREA: 1.0000 | TOPICAL_CREAM | Freq: Every day | VAGINAL | Status: DC

## 2015-03-18 NOTE — Progress Notes (Signed)
Dana Payne June 30, 1965 [id number]   History:    49 y.o.  for annual gyn exam  with burning and irritation and vulvar pruritus. Questionable slight white discharge. Patient in a monogamous relationship. Patient with very minimal if any vasomotor symptoms no longer on hormone replacement therapy which she was put on transdermal estrogen last year. Used it for a very short time after confirming that she was in the menopause by laboratory [facility].She is being followed by Dr. Birdie Riddle who is monitoingr her hypertension, hypercholesterolemia, and type 2 diabetes. Her PCP is doing her blood work.Patient with prior history of total abdominal hysterectomy.She had a past history of CO2 laser of her cervix by another provider many years ago and I do not have the grade of dysplasia report but her Pap smears have been normal the past 3 years here in our office.Patient stated in 2012 she had a colonoscopy because of rectal bleeding and there was some form of colonic inflammation but no polyps. She did mention that she has an aunt with colon cancer an uncle with colon polyps in her father was recently diagnosed with colon polyps.  Past medical history,surgical history, family history and social history were all reviewed and documented in the EPIC chart.  Gynecologic History No LMP recorded. Patient has had a hysterectomy. Contraception: status post hysterectomy Last Pap: 2014. Results were: normal Last mammogram: 2016. Results were: normal  Obstetric History OB History  Gravida Para Term Preterm AB SAB TAB Ectopic Multiple Living  0         0         ROS: A ROS was performed and pertinent positives and negatives are included in the history.  GENERAL: No fevers or chills. HEENT: No change in vision, no earache, sore throat or sinus congestion. NECK: No pain or stiffness. CARDIOVASCULAR: No chest pain or pressure. No palpitations. PULMONARY: No shortness of breath, cough or wheeze. GASTROINTESTINAL: No  abdominal pain, nausea, vomiting or diarrhea, melena or bright red blood per rectum. GENITOURINARY: No urinary frequency, urgency, hesitancy or dysuria. MUSCULOSKELETAL: No joint or muscle pain, no back pain, no recent trauma. DERMATOLOGIC: No rash, no itching, no lesions. ENDOCRINE: No polyuria, polydipsia, no heat or cold intolerance. No recent change in weight. HEMATOLOGICAL: No anemia or easy bruising or bleeding. NEUROLOGIC: No headache, seizures, numbness, tingling or weakness. PSYCHIATRIC: No depression, no loss of interest in normal activity or change in sleep pattern.     Exam: chaperone present  BP 128/84 mmHg  Ht 5\' 5"  (1.651 m)  Wt 169 lb (76.658 kg)  BMI 28.12 kg/m2  Body mass index is 28.12 kg/(m^2).  General appearance : Well developed well nourished female. No acute distress HEENT: Eyes: no retinal hemorrhage or exudates,  Neck supple, trachea midline, no carotid bruits, no thyroidmegaly Lungs: Clear to auscultation, no rhonchi or wheezes, or rib retractions  Heart: Regular rate and rhythm, no murmurs or gallops Breast:Examined in sitting and supine position were symmetrical in appearance, no palpable masses or tenderness,  no skin retraction, no nipple inversion, no nipple discharge, no skin discoloration, no axillary or supraclavicular lymphadenopathy Abdomen: no palpable masses or tenderness, no rebound or guarding Extremities: no edema or skin discoloration or tenderness  Pelvic:  Bartholin, Urethra, Skene Glands: Within normal limits             Vagina: No gross lesions or discharge  Cervix: Absent  Uterus  absent  Adnexa  Without masses or tenderness  Anus and perineum  normal   Rectovaginal  normal sphincter tone without palpated masses or tenderness             Hemoccult cards will be provided   Wet prep: Moderate WBC, moderate bacteria  Assessment/Plan:  49 y.o. female for annual exam will be prescribed Cleocin vaginal cream to apply daily at bedtime for one  week for subclinical BV. Also she'll be provided with Hemoccult cards to submit to the office for testing. Pap smear not done today. Next year she will need a bone density study for baseline.   Terrance Mass MD, 3:00 PM 03/18/2015

## 2015-03-18 NOTE — Patient Instructions (Addendum)
Bone Densitometry Bone densitometry is an imaging test that uses a special X-ray to measure the amount of calcium and other minerals in your bones (bone density). This test is also known as a bone mineral density test or dual-energy X-ray absorptiometry (DXA). The test can measure bone density at your hip and your spine. It is similar to having a regular X-ray. You may have this test to:  Diagnose a condition that causes weak or thin bones (osteoporosis).  Predict your risk of a broken bone (fracture).  Determine how well osteoporosis treatment is working. LET YOUR HEALTH CARE PROVIDER KNOW ABOUT:  Any allergies you have.  All medicines you are taking, including vitamins, herbs, eye drops, creams, and over-the-counter medicines.  Previous problems you or members of your family have had with the use of anesthetics.  Any blood disorders you have.  Previous surgeries you have had.  Medical conditions you have.  Possibility of pregnancy.  Any other medical test you had within the previous 14 days that used contrast material. RISKS AND COMPLICATIONS Generally, this is a safe procedure. However, problems can occur and may include the following:  This test exposes you to a very small amount of radiation.  The risks of radiation exposure may be greater to unborn children. BEFORE THE PROCEDURE  Do not take any calcium supplements for 24 hours before having the test. You can otherwise eat and drink what you usually do.  Take off all metal jewelry, eyeglasses, dental appliances, and any other metal objects. PROCEDURE  You may lie on an exam table. There will be an X-ray generator below you and an imaging device above you.  Other devices, such as boxes or braces, may be used to position your body properly for the scan.  You will need to lie still while the machine slowly scans your body.  The images will show up on a computer monitor. AFTER THE PROCEDURE You may need more testing  at a later time.   This information is not intended to replace advice given to you by your health care provider. Make sure you discuss any questions you have with your health care provider.   Document Released: 05/04/2004 Document Revised: 05/03/2014 Document Reviewed: 09/20/2013 Elsevier Interactive Patient Education 2016 Elsevier Inc. Estradiol vaginal tablets What is this medicine? ESTRADIOL (es tra DYE ole) vaginal tablet is used to help relieve symptoms of vaginal irritation and dryness that occurs in some women during menopause. This medicine may be used for other purposes; ask your health care provider or pharmacist if you have questions. What should I tell my health care provider before I take this medicine? They need to know if you have any of these conditions: -abnormal vaginal bleeding -blood vessel disease or blood clots -breast, cervical, endometrial, ovarian, liver, or uterine cancer -dementia -diabetes -gallbladder disease -heart disease or recent heart attack -high blood pressure -high cholesterol -high level of calcium in the blood -hysterectomy -kidney disease -liver disease -migraine headaches -protein C deficiency -protein S deficiency -stroke -systemic lupus erythematosus (SLE) -tobacco smoker -an unusual or allergic reaction to estrogens, other hormones, medicines, foods, dyes, or preservatives -pregnant or trying to get pregnant -breast-feeding How should I use this medicine? This medicine is only for use in the vagina. Do not take by mouth. Wash and dry your hands before and after use. Read package directions carefully. Unwrap the applicator package. Be sure to use a new applicator for each dose. Use at the same time each day. If the tablet   has fallen out of the applicator, but is still in the package, carefully place it back into the applicator. If the tablet has fallen out of the package, that applicator should be thrown out and you should use a new  applicator containing a new tablet. Lie on your back, part and bend your knees. Gently insert the applicator as far as comfortably possible into the vagina. Then, gently press the plunger until the plunger is fully depressed. This will release the tablet into the vagina. Gently remove the applicator. Throw away the applicator after use. Do not use your medicine more often than directed. Do not stop using except on the advice of your doctor or health care professional. Talk to your pediatrician regarding the use of this medicine in children. This medicine is not approved for use in children. A patient package insert for the product will be given with each prescription and refill. Read this sheet carefully each time. The sheet may change frequently. Overdosage: If you think you have taken too much of this medicine contact a poison control center or emergency room at once. NOTE: This medicine is only for you. Do not share this medicine with others. What if I miss a dose? If you miss a dose, take it as soon as you can. If it is almost time for your next dose, take only that dose. Do not take double or extra doses. What may interact with this medicine? Do not take this medicine with any of the following medications: -aromatase inhibitors like aminoglutethimide, anastrozole, exemestane, letrozole, testolactone This medicine may also interact with the following medications: -antibiotics used to treat tuberculosis like rifabutin, rifampin and rifapentene -raloxifene or tamoxifen -warfarin This list may not describe all possible interactions. Give your health care provider a list of all the medicines, herbs, non-prescription drugs, or dietary supplements you use. Also tell them if you smoke, drink alcohol, or use illegal drugs. Some items may interact with your medicine. What should I watch for while using this medicine? Visit your health care professional for regular checks on your progress. You will need a  regular breast and pelvic exam. You should also discuss the need for regular mammograms with your health care professional, and follow his or her guidelines. This medicine can make your body retain fluid, making your fingers, hands, or ankles swell. Your blood pressure can go up. Contact your doctor or health care professional if you feel you are retaining fluid. If you have any reason to think you are pregnant; stop taking this medicine at once and contact your doctor or health care professional. Tobacco smoking increases the risk of getting a blood clot or having a stroke, especially if you are more than 50 years old. You are strongly advised not to smoke. If you wear contact lenses and notice visual changes, or if the lenses begin to feel uncomfortable, consult your eye care specialist. If you are going to have elective surgery, you may need to stop taking this medicine beforehand. Consult your health care professional for advice prior to scheduling the surgery. What side effects may I notice from receiving this medicine? Side effects that you should report to your doctor or health care professional as soon as possible: -allergic reactions like skin rash, itching or hives, swelling of the face, lips, or tongue -breast tissue changes or discharge -changes in vision -chest pain -confusion, trouble speaking or understanding -dark urine -general ill feeling or flu-like symptoms -light-colored stools -nausea, vomiting -pain, swelling, warmth in the leg -  right upper belly pain -severe headaches -shortness of breath -sudden numbness or weakness of the face, arm or leg -trouble walking, dizziness, loss of balance or coordination -unusual vaginal bleeding -yellowing of the eyes or skin Side effects that usually do not require medical attention (report to your doctor or health care professional if they continue or are bothersome): -hair loss -increased hunger or thirst -increased  urination -symptoms of vaginal infection like itching, irritation or unusual discharge -unusually weak or tired This list may not describe all possible side effects. Call your doctor for medical advice about side effects. You may report side effects to FDA at 1-800-FDA-1088. Where should I keep my medicine? Keep out of the reach of children. Store at room temperature between 15 and 30 degrees C (59 and 86 degrees F). Throw away any unused medicine after the expiration date. NOTE: This sheet is a summary. It may not cover all possible information. If you have questions about this medicine, talk to your doctor, pharmacist, or health care provider.    2016, Elsevier/Gold Standard. (2014-03-27 09:22:51) Clindamycin vaginal cream What is this medicine? CLINDAMYCIN ([place] sin) is a lincosamide antibiotic. It is used to treat vaginal infections caused by certain bacteria. This medicine may be used for other purposes; ask your health care provider or pharmacist if you have questions. What should I tell my health care provider before I take this medicine? They need to know if you have any of these conditions: -diarrhea -inflammatory bowel disease -kidney or liver disease -stomach problems like colitis -an unusual or allergic reaction to clindamycin, lincomycin, other medicines, foods, dyes or preservatives -pregnant or trying to get pregnant -breast-feeding How should I use this medicine? This medicine is only for use in the vagina. Place in the vagina using the special applicator supplied with the cream. Wash hands before and after use. Fill the applicator with cream. Lie on your back, part and bend your knees. Insert the applicator into the vagina and push the plunger to expel the cream into the vagina. Wash the applicator with warm soapy water and rinse well. Keep this medicine out of the eyes. If you do get any in your eyes rinse out with plenty of cool tap water. Take your medicine at  regular intervals. Do not take your medicine more often than directed. Use this medicine for the full course prescribed by your doctor or health care professional, even if you think your condition is better. Do not stop using except on your the advice of your doctor or health care professional. Talk to your pediatrician regarding the use of this medicine in children. Special care may be needed. Overdosage: If you think you have taken too much of this medicine contact a poison control center or emergency room at once. NOTE: This medicine is only for you. Do not share this medicine with others. What if I miss a dose? If you miss a dose, use it as soon as you can. If it is almost time for your next dose, use only that dose. Do not use double or extra doses. What may interact with this medicine? Interactions are not expected. Do not use any other vaginal products without telling your doctor or health care professional. This list may not describe all possible interactions. Give your health care provider a list of all the medicines, herbs, non-prescription drugs, or dietary supplements you use. Also tell them if you smoke, drink alcohol, or use illegal drugs. Some items may interact with your medicine.  What should I watch for while using this medicine? Tell your doctor or health care professional if your symptoms do not start to get better in a few days. Do not use tampons or douches while using this medicine. Do not have sex until you have finished your treatment. Having sex can make the treatment less effective. After you finish treatment, do not use latex condoms for three days. There may still be some medicine in the vagina. This can damage the latex and make the condom less effective at preventing pregnancy. Your clothing may get soiled. To help prevent reinfection, wear freshly washed cotton, not synthetic, underwear. What side effects may I notice from receiving this medicine? Side effects that you  should report to your doctor or health care professional as soon as possible: -allergic reactions like skin rash, itching or hives, swelling of the face, lips, or tongue -diarrhea that is watery or severe -fever or chills, sore throat -increased thirst -itching of the vaginal or genital area -pain during sexual intercourse -stomach pain or cramps -thick white vaginal discharge -unusual bleeding or bruising Side effects that usually do not require medical attention (report to your doctor or health care professional if they continue or are bothersome): -nausea, vomiting This list may not describe all possible side effects. Call your doctor for medical advice about side effects. You may report side effects to FDA at 1-800-FDA-1088. Where should I keep my medicine? Keep out of the reach of children. Store at room temperature between 20 and 25 degrees C (68 and 77 degrees F). Do not freeze. Throw away any unused medicine after the expiration date. NOTE: This sheet is a summary. It may not cover all possible information. If you have questions about this medicine, talk to your doctor, pharmacist, or health care provider.    2016, Elsevier/Gold Standard. (2012-11-16 16:18:30) Bacterial Vaginosis Bacterial vaginosis is a vaginal infection that occurs when the normal balance of bacteria in the vagina is disrupted. It results from an overgrowth of certain bacteria. This is the most common vaginal infection in women of childbearing age. Treatment is important to prevent complications, especially in pregnant women, as it can cause a premature delivery. CAUSES  Bacterial vaginosis is caused by an increase in harmful bacteria that are normally present in smaller amounts in the vagina. Several different kinds of bacteria can cause bacterial vaginosis. However, the reason that the condition develops is not fully understood. RISK FACTORS Certain activities or behaviors can put you at an increased risk of  developing bacterial vaginosis, including:  Having a new sex partner or multiple sex partners.  Douching.  Using an intrauterine device (IUD) for contraception. Women do not get bacterial vaginosis from toilet seats, bedding, swimming pools, or contact with objects around them. SIGNS AND SYMPTOMS  Some women with bacterial vaginosis have no signs or symptoms. Common symptoms include:  Grey vaginal discharge.  A fishlike odor with discharge, especially after sexual intercourse.  Itching or burning of the vagina and vulva.  Burning or pain with urination. DIAGNOSIS  Your health care provider will take a medical history and examine the vagina for signs of bacterial vaginosis. A sample of vaginal fluid may be taken. Your health care provider will look at this sample under a microscope to check for bacteria and abnormal cells. A vaginal pH test may also be done.  TREATMENT  Bacterial vaginosis may be treated with antibiotic medicines. These may be given in the form of a pill or a vaginal cream. A  second round of antibiotics may be prescribed if the condition comes back after treatment. Because bacterial vaginosis increases your risk for sexually transmitted diseases, getting treated can help reduce your risk for chlamydia, gonorrhea, HIV, and herpes. HOME CARE INSTRUCTIONS   Only take over-the-counter or prescription medicines as directed by your health care provider.  If antibiotic medicine was prescribed, take it as directed. Make sure you finish it even if you start to feel better.  Tell all sexual partners that you have a vaginal infection. They should see their health care provider and be treated if they have problems, such as a mild rash or itching.  During treatment, it is important that you follow these instructions:  Avoid sexual activity or use condoms correctly.  Do not douche.  Avoid alcohol as directed by your health care provider.  Avoid breastfeeding as directed by  your health care provider. SEEK MEDICAL CARE IF:   Your symptoms are not improving after 3 days of treatment.  You have increased discharge or pain.  You have a fever. MAKE SURE YOU:   Understand these instructions.  Will watch your condition.  Will get help right away if you are not doing well or get worse. FOR MORE INFORMATION  Centers for Disease Control and Prevention, Division of STD Prevention: [employer] American Sexual Health Association (ASHA): www.ashastd.org    This information is not intended to replace advice given to you by your health care provider. Make sure you discuss any questions you have with your health care provider.   Document Released: 04/12/2005 Document Revised: 05/03/2014 Document Reviewed: 11/22/2012 Elsevier Interactive Patient Education [employer].

## 2015-03-27 LAB — HM DIABETES EYE EXAM

## 2015-05-30 ENCOUNTER — Other Ambulatory Visit: Payer: BLUE CROSS/BLUE SHIELD | Admitting: Anesthesiology

## 2015-05-30 DIAGNOSIS — Z1211 Encounter for screening for malignant neoplasm of colon: Secondary | ICD-10-CM

## 2015-06-05 ENCOUNTER — Ambulatory Visit: Payer: BLUE CROSS/BLUE SHIELD | Admitting: Family Medicine

## 2015-06-13 ENCOUNTER — Other Ambulatory Visit: Payer: Self-pay | Admitting: Family Medicine

## 2015-06-16 NOTE — Telephone Encounter (Signed)
Medication filled to pharmacy as requested.   

## 2015-07-11 ENCOUNTER — Encounter: Payer: Self-pay | Admitting: Family Medicine

## 2015-07-11 ENCOUNTER — Ambulatory Visit (INDEPENDENT_AMBULATORY_CARE_PROVIDER_SITE_OTHER): Payer: BLUE CROSS/BLUE SHIELD | Admitting: Family Medicine

## 2015-07-11 VITALS — BP 124/82 | HR 83 | Temp 98.1°F | Resp 16 | Ht 65.0 in | Wt 171.1 lb

## 2015-07-11 DIAGNOSIS — I10 Essential (primary) hypertension: Secondary | ICD-10-CM | POA: Diagnosis not present

## 2015-07-11 DIAGNOSIS — E1142 Type 2 diabetes mellitus with diabetic polyneuropathy: Secondary | ICD-10-CM | POA: Diagnosis not present

## 2015-07-11 DIAGNOSIS — E785 Hyperlipidemia, unspecified: Secondary | ICD-10-CM

## 2015-07-11 LAB — HEPATIC FUNCTION PANEL
ALT: 29 U/L (ref 0–35)
AST: 20 U/L (ref 0–37)
Albumin: 4.7 g/dL (ref 3.5–5.2)
Alkaline Phosphatase: 81 U/L (ref 39–117)
Bilirubin, Direct: 0.1 mg/dL (ref 0.0–0.3)
TOTAL PROTEIN: 7.5 g/dL (ref 6.0–8.3)
Total Bilirubin: 0.5 mg/dL (ref 0.2–1.2)

## 2015-07-11 LAB — BASIC METABOLIC PANEL
BUN: 13 mg/dL (ref 6–23)
CO2: 29 meq/L (ref 19–32)
Calcium: 10.1 mg/dL (ref 8.4–10.5)
Chloride: 99 mEq/L (ref 96–112)
Creatinine, Ser: 0.71 mg/dL (ref 0.40–1.20)
GFR: 111.77 mL/min (ref >60.00)
Glucose, Bld: 200 mg/dL — ABNORMAL HIGH (ref 70–99)
POTASSIUM: 4.1 meq/L (ref 3.5–5.1)
Sodium: 137 mEq/L (ref 135–145)

## 2015-07-11 LAB — LIPID PANEL
CHOL/HDL RATIO: 4
CHOLESTEROL: 149 mg/dL (ref 0–200)
HDL: 41.4 mg/dL (ref >39.00)
LDL Cholesterol: 72 mg/dL (ref 0–99)
NonHDL: 107.59
TRIGLYCERIDES: 176 mg/dL — AB (ref 0.0–149.0)
VLDL: 35.2 mg/dL (ref 0.0–40.0)

## 2015-07-11 LAB — CBC WITH DIFFERENTIAL/PLATELET
Basophils Absolute: 0 10*3/uL (ref 0.0–0.1)
Basophils Relative: 0.5 % (ref 0.0–3.0)
EOS ABS: 0.1 10*3/uL (ref 0.0–0.7)
Eosinophils Relative: 1.6 % (ref 0.0–5.0)
HCT: 41.9 % (ref 36.0–46.0)
HEMOGLOBIN: 14.2 g/dL (ref 12.0–15.0)
LYMPHS PCT: 30.3 % (ref 12.0–46.0)
Lymphs Abs: 2.2 10*3/uL (ref 0.7–4.0)
MCHC: 34 g/dL (ref 30.0–36.0)
MCV: 85.8 fl (ref 78.0–100.0)
MONO ABS: 0.4 10*3/uL (ref 0.1–1.0)
Monocytes Relative: 6.3 % (ref 3.0–12.0)
Neutro Abs: 4.4 10*3/uL (ref 1.4–7.7)
Neutrophils Relative %: 61.3 % (ref 43.0–77.0)
Platelets: 254 10*3/uL (ref 150.0–400.0)
RBC: 4.88 Mil/uL (ref 3.87–5.11)
RDW: 12.2 % (ref 11.5–15.5)
WBC: 7.1 10*3/uL (ref 4.0–10.5)

## 2015-07-11 LAB — HEMOGLOBIN A1C: Hgb A1c MFr Bld: 9.8 % — ABNORMAL HIGH (ref 4.6–6.5)

## 2015-07-11 LAB — TSH: TSH: 1.16 u[IU]/mL (ref 0.35–4.50)

## 2015-07-11 NOTE — Assessment & Plan Note (Signed)
Chronic problem.  Pt is not taking Invokana or Januvia.  Had long discussion about need for medication compliance to get A1C to goal to prevent long term complications of diabetes including MI, amputations, HD.  UTD on eye exam.  Foot exam done today.  Check labs.  Adjust meds prn

## 2015-07-11 NOTE — Progress Notes (Signed)
Pre visit review using our clinic review tool, if applicable. No additional management support is needed unless otherwise documented below in the visit note. 

## 2015-07-11 NOTE — Patient Instructions (Signed)
Follow up in 3-4 months to recheck diabetes We'll notify you of your lab results and make any changes if needed Try and work on healthy diet and regular exercise- you can do it! Call with any questions or concerns If you want to join [place] at the new [place] office, any scheduled appointments will automatically transfer and we will see you at [address] [place] [address] [place], [place] [address] (OPENING 3/23) Happy Spring!!!

## 2015-07-11 NOTE — Assessment & Plan Note (Signed)
Chronic problem.  Well controlled.  Asymptomatic today.  Again discussed need for stress management.  Check labs.  No anticipated med changes at this time.

## 2015-07-11 NOTE — Assessment & Plan Note (Signed)
Chronic problem.  Tolerating statin w/o difficulty.  Stressed need for healthy diet and regular exercise.  Check labs.  Adjust meds prn  

## 2015-07-11 NOTE — Progress Notes (Signed)
   Subjective:    Patient ID: Dana Payne, female    DOB: 10-Dec-1965, 50 y.o.   MRN: [id number]  HPI DM- chronic problem, hx of poor control.  Pt is taking metformin but not taking her Invokana and Januvia.  'i don't want to be hooked on all that stuff'.  On ACE for renal protection.  UTD on eye exam.  Due for foot exam.  Admits to not eating right.  Denies symptomatic lows.  + intermittent numbness/tingling of hands/feet.    HTN- chronic problem, on Lisinopril HCTZ daily.  Good control.  Denies CP, SOB, HAs, visual changes, edema.  Hyperlipidemia- chronic problem, on Simvastatin.  Denies abd pain, N/V, myalgias.   Review of Systems For ROS see HPI     Objective:   Physical Exam  Constitutional: She is oriented to person, place, and time. She appears well-developed and well-nourished. No distress.  HENT:  Head: Normocephalic and atraumatic.  Eyes: Conjunctivae and EOM are normal. Pupils are equal, round, and reactive to light.  Neck: Normal range of motion. Neck supple. Thyromegaly present.  Cardiovascular: Normal rate, regular rhythm, normal heart sounds and intact distal pulses.   No murmur heard. Pulmonary/Chest: Effort normal and breath sounds normal. No respiratory distress.  Abdominal: Soft. She exhibits no distension. There is no tenderness.  Musculoskeletal: She exhibits no edema.  Lymphadenopathy:    She has no cervical adenopathy.  Neurological: She is alert and oriented to person, place, and time.  Skin: Skin is warm and dry.  Psychiatric: She has a normal mood and affect. Her behavior is normal.  Vitals reviewed.         Assessment & Plan:

## 2015-07-14 ENCOUNTER — Other Ambulatory Visit: Payer: Self-pay | Admitting: General Practice

## 2015-07-14 MED ORDER — SITAGLIPTIN PHOSPHATE 100 MG PO TABS: 100.0000 mg | ORAL_TABLET | Freq: Every day | ORAL | Status: DC

## 2015-07-14 MED ORDER — METFORMIN HCL ER 500 MG PO TB24: ORAL_TABLET | ORAL | Status: DC

## 2015-07-14 MED ORDER — CANAGLIFLOZIN 100 MG PO TABS: 100.0000 mg | ORAL_TABLET | Freq: Every day | ORAL | Status: DC

## 2015-09-26 ENCOUNTER — Other Ambulatory Visit: Payer: Self-pay | Admitting: Family Medicine

## 2015-09-26 NOTE — Telephone Encounter (Signed)
Medication filled to pharmacy as requested.  Pt needs a diabetes follow up.

## 2015-10-13 ENCOUNTER — Telehealth: Payer: Self-pay | Admitting: *Deleted

## 2015-10-13 NOTE — Telephone Encounter (Signed)
Left message for pt to call.

## 2015-10-13 NOTE — Telephone Encounter (Signed)
Pt asked when should she have next colonscopy? Last one done in 2012.Family history aunt with colon cancer an uncle with colon polyps in her father was recently diagnosed with colon polyps. Please advise

## 2015-10-13 NOTE — Telephone Encounter (Signed)
I would recommend every 5 years find out who her gastroenterologist who did her last colonoscopy so she can schedule appointment if it was done here in [facility]

## 2015-10-14 ENCOUNTER — Telehealth: Payer: Self-pay | Admitting: Family Medicine

## 2015-10-14 NOTE — Telephone Encounter (Signed)
atient Name: Dana Payne  DOB: November 26, 1965    Initial Comment Caller states calling to reschedule appt and was told to speak to triage nurse regarding sx. Back hurting for few weeks and now radiating to left breast. States under a lot of stress   Nurse Assessment  Nurse: Thad Ranger, RN, Langley Gauss Date/Time (Eastern Time): 10/14/2015 4:58:37 PM  Confirm and document reason for call. If symptomatic, describe symptoms. You must click the next button to save text entered. ---Caller states calling to reschedule appt and was told to speak to triage nurse regarding sx. Back hurting for few weeks and now radiating to left breast. States under a lot of stress. Denies all pain at this time. States she has an appt on Thurs at 0930 but she wants to cancel the appt and make it for Friday. Was told by office staff, she could be seen after 2pm and get her diabetes check at that time as well.  Has the patient traveled out of the country within the last 30 days? ---Not Applicable  Does the patient have any new or worsening symptoms? ---Yes  Will a triage be completed? ---Yes  Related visit to physician within the last 2 weeks? ---No  Does the PT have any chronic conditions? (i.e. diabetes, asthma, etc.) ---Yes  List chronic conditions. ---NIDDM  Is the patient pregnant or possibly pregnant? (Ask all females between the ages of 66-55) ---No  Is this a behavioral health or substance abuse call? ---No     Guidelines    Guideline Title Affirmed Question Affirmed Notes  Chest Pain [1] Chest pain lasting <= 5 minutes AND [2] NO chest pain or cardiac symptoms now (Exceptions: pains lasting a few seconds)    Final Disposition User   See Physician within 24 Hours [place], RN, Langley Gauss    Comments  There is not a site available in [place] for [employer] to make an appt. Advised pt to call the [facility] in the am during reg bus hrs to get an appt. Advised it is not suggested she wait until Thurs am to be seen or to cancel  the appt.  Unable to copy/paste record in to epic due to there is not a [employer] available.  After speaking to fellow RN's, found [facility] per epic. No appts available per schedule search.   Referrals  REFERRED TO PCP OFFICE   Disagree/Comply: Comply

## 2015-10-15 NOTE — Telephone Encounter (Signed)
Pt scheduled 10/17/15 @ 2pm

## 2015-10-15 NOTE — Telephone Encounter (Signed)
Can you please call pt and schedule

## 2015-10-16 ENCOUNTER — Ambulatory Visit: Payer: BLUE CROSS/BLUE SHIELD | Admitting: Family Medicine

## 2015-10-17 ENCOUNTER — Encounter: Payer: Self-pay | Admitting: Family Medicine

## 2015-10-17 ENCOUNTER — Ambulatory Visit (INDEPENDENT_AMBULATORY_CARE_PROVIDER_SITE_OTHER): Payer: BLUE CROSS/BLUE SHIELD | Admitting: Family Medicine

## 2015-10-17 ENCOUNTER — Other Ambulatory Visit (INDEPENDENT_AMBULATORY_CARE_PROVIDER_SITE_OTHER): Payer: BLUE CROSS/BLUE SHIELD

## 2015-10-17 ENCOUNTER — Ambulatory Visit: Payer: BLUE CROSS/BLUE SHIELD | Admitting: Family Medicine

## 2015-10-17 VITALS — BP 118/80 | HR 76 | Temp 98.2°F | Resp 16 | Ht 65.0 in | Wt 159.5 lb

## 2015-10-17 DIAGNOSIS — I10 Essential (primary) hypertension: Secondary | ICD-10-CM | POA: Diagnosis not present

## 2015-10-17 DIAGNOSIS — E785 Hyperlipidemia, unspecified: Secondary | ICD-10-CM

## 2015-10-17 DIAGNOSIS — R0789 Other chest pain: Secondary | ICD-10-CM

## 2015-10-17 DIAGNOSIS — F32A Depression, unspecified: Secondary | ICD-10-CM

## 2015-10-17 DIAGNOSIS — F329 Major depressive disorder, single episode, unspecified: Secondary | ICD-10-CM

## 2015-10-17 DIAGNOSIS — F419 Anxiety disorder, unspecified: Secondary | ICD-10-CM

## 2015-10-17 DIAGNOSIS — E1142 Type 2 diabetes mellitus with diabetic polyneuropathy: Secondary | ICD-10-CM

## 2015-10-17 DIAGNOSIS — E049 Nontoxic goiter, unspecified: Secondary | ICD-10-CM

## 2015-10-17 DIAGNOSIS — F418 Other specified anxiety disorders: Secondary | ICD-10-CM

## 2015-10-17 LAB — CBC WITH DIFFERENTIAL/PLATELET
BASOS PCT: 0.5 % (ref 0.0–3.0)
Basophils Absolute: 0 10*3/uL (ref 0.0–0.1)
EOS ABS: 0.2 10*3/uL (ref 0.0–0.7)
Eosinophils Relative: 2.6 % (ref 0.0–5.0)
HCT: 43 % (ref 36.0–46.0)
Hemoglobin: 14.3 g/dL (ref 12.0–15.0)
LYMPHS ABS: 2.5 10*3/uL (ref 0.7–4.0)
Lymphocytes Relative: 26.7 % (ref 12.0–46.0)
MCHC: 33.3 g/dL (ref 30.0–36.0)
MCV: 86.7 fl (ref 78.0–100.0)
MONO ABS: 0.4 10*3/uL (ref 0.1–1.0)
Monocytes Relative: 4.8 % (ref 3.0–12.0)
NEUTROS ABS: 6 10*3/uL (ref 1.4–7.7)
Neutrophils Relative %: 65.4 % (ref 43.0–77.0)
PLATELETS: 271 10*3/uL (ref 150.0–400.0)
RBC: 4.96 Mil/uL (ref 3.87–5.11)
RDW: 13.1 % (ref 11.5–15.5)
WBC: 9.2 10*3/uL (ref 4.0–10.5)

## 2015-10-17 LAB — LIPID PANEL
Cholesterol: 125 mg/dL (ref 0–200)
HDL: 43.2 mg/dL (ref >39.00)
LDL CALC: 62 mg/dL (ref 0–99)
NONHDL: 82.18
TRIGLYCERIDES: 103 mg/dL (ref 0.0–149.0)
Total CHOL/HDL Ratio: 3
VLDL: 20.6 mg/dL (ref 0.0–40.0)

## 2015-10-17 LAB — BASIC METABOLIC PANEL
BUN: 9 mg/dL (ref 6–23)
CHLORIDE: 101 meq/L (ref 96–112)
CO2: 32 meq/L (ref 19–32)
Calcium: 10.4 mg/dL (ref 8.4–10.5)
Creatinine, Ser: 0.66 mg/dL (ref 0.40–1.20)
GFR: 121.47 mL/min (ref >60.00)
Glucose, Bld: 102 mg/dL — ABNORMAL HIGH (ref 70–99)
Potassium: 4.2 mEq/L (ref 3.5–5.1)
SODIUM: 140 meq/L (ref 135–145)

## 2015-10-17 LAB — HEPATIC FUNCTION PANEL
ALK PHOS: 71 U/L (ref 39–117)
ALT: 19 U/L (ref 0–35)
AST: 15 U/L (ref 0–37)
Albumin: 4.9 g/dL (ref 3.5–5.2)
BILIRUBIN DIRECT: 0.1 mg/dL (ref 0.0–0.3)
TOTAL PROTEIN: 7.1 g/dL (ref 6.0–8.3)
Total Bilirubin: 0.4 mg/dL (ref 0.2–1.2)

## 2015-10-17 LAB — TSH: TSH: 1.08 u[IU]/mL (ref 0.35–4.50)

## 2015-10-17 LAB — HEMOGLOBIN A1C: Hgb A1c MFr Bld: 7.8 % — ABNORMAL HIGH (ref 4.6–6.5)

## 2015-10-17 MED ORDER — SIMVASTATIN 40 MG PO TABS: 40.0000 mg | ORAL_TABLET | Freq: Every day | ORAL | Status: DC

## 2015-10-17 MED ORDER — METFORMIN HCL ER 500 MG PO TB24: ORAL_TABLET | ORAL | Status: DC

## 2015-10-17 MED ORDER — OMEPRAZOLE 20 MG PO CPDR: 20.0000 mg | DELAYED_RELEASE_CAPSULE | Freq: Every day | ORAL | Status: DC

## 2015-10-17 NOTE — Progress Notes (Signed)
   Subjective:    Patient ID: Dana Payne, female    DOB: 11/18/1965, 50 y.o.   MRN: [id number]  HPI HTN- chronic problem, on Lisinopril HCTZ.  Good BP control.  No SOB, HAs, visual changes, edema.  Hyperlipidemia- chronic problem, on Simvastatin.  Denies abd pain, N/V.  DM- chronic problem, on Metformin.  On ACE for renal protection.  UTD on foot exam, eye exam.  Pt is walking 3-5x/week and has changed diet.  Now down 12 lbs since last visit.  Anxiety- pt reports 2 months of increased stress.  She has had 6 people close to her pass away.  For last 2 weeks will develop pain in center of back and L shoulder blade that travels around the front to chest.  No SOB. Pain is sharp.  Pain doesn't worsen w/ motion.  Pt reports sxs improved w/ Gas-X.  Some GERD.  Pain is not worse w/ her regular exercise pattern.   Review of Systems For ROS see HPI     Objective:   Physical Exam  Constitutional: She is oriented to person, place, and time. She appears well-developed and well-nourished. No distress.  HENT:  Head: Normocephalic and atraumatic.  Eyes: Conjunctivae and EOM are normal. Pupils are equal, round, and reactive to light.  Neck: Normal range of motion. Neck supple. Thyromegaly present.  Cardiovascular: Normal rate, regular rhythm, normal heart sounds and intact distal pulses.   No murmur heard. Pulmonary/Chest: Effort normal and breath sounds normal. No respiratory distress.  Abdominal: Soft. She exhibits no distension. There is no tenderness.  Musculoskeletal: She exhibits no edema.  Lymphadenopathy:    She has no cervical adenopathy.  Neurological: She is alert and oriented to person, place, and time.  Skin: Skin is warm and dry.  Psychiatric: She has a normal mood and affect. Her behavior is normal.  Vitals reviewed.         Assessment & Plan:

## 2015-10-17 NOTE — Patient Instructions (Signed)
Go to [facility] to get your labs done Schedule your complete physical in 3-4 month We'll notify you of your lab results and make any changes if needed Start the Omeprazole daily to decrease acid production Continue the Gas-X when you have pain in your back for symptom relief Continue the good work on healthy diet and regular exercise- you look great! Call with any questions or concerns Have a great weekend!!!

## 2015-10-17 NOTE — Progress Notes (Signed)
Pre visit review using our clinic review tool, if applicable. No additional management support is needed unless otherwise documented below in the visit note. 

## 2015-10-19 NOTE — Assessment & Plan Note (Signed)
Chronic problem.  Currently asymptomatic.  Check labs.  No anticipated med changes. 

## 2015-10-19 NOTE — Assessment & Plan Note (Signed)
Chronic problem.  UTD on foot exam, eye exam.  On ACE for renal protection.  Pt has lost 12 lbs since last visit- applauded her efforts.  Check labs.  Adjust meds prn

## 2015-10-19 NOTE — Assessment & Plan Note (Signed)
Chronic problem.  Tolerating statin w/o difficulty.  Check labs.  Adjust meds prn  

## 2015-10-19 NOTE — Assessment & Plan Note (Signed)
Chronic thyromegaly.  I have ordered an [place] for a pt x3 but she has not followed up on this.  Stressed the importance of ongoing surveillance.

## 2015-10-19 NOTE — Assessment & Plan Note (Signed)
Recurrent issue for pt.  She has had a lot of loss recently and is very stressed about this.  She is not interested in medication at this time although we can certainly revisit this in the future if needed.  Encouraged grief counseling.  Will follow.

## 2015-10-19 NOTE — Assessment & Plan Note (Signed)
New.  May be stress related or due to GERD given improvement w/ Gas-X.  EKG unchanged from previous.  Start PPI.  Reviewed lifestyle and dietary modifications.  Reviewed supportive care and red flags that should prompt return.  Pt expressed understanding and is in agreement w/ plan.

## 2015-10-20 ENCOUNTER — Encounter: Payer: Self-pay | Admitting: General Practice

## 2015-10-22 NOTE — Telephone Encounter (Signed)
Left detailed message on pt voicemail.

## 2015-10-23 ENCOUNTER — Ambulatory Visit: Payer: BLUE CROSS/BLUE SHIELD | Admitting: Family Medicine

## 2015-11-06 ENCOUNTER — Ambulatory Visit
Admission: RE | Admit: 2015-11-06 | Discharge: 2015-11-06 | Disposition: A | Discharge status: Home / Self Care | Payer: BLUE CROSS/BLUE SHIELD | Source: Ambulatory Visit | Attending: Family Medicine | Admitting: Family Medicine

## 2015-11-06 DIAGNOSIS — E049 Nontoxic goiter, unspecified: Secondary | ICD-10-CM

## 2016-01-06 ENCOUNTER — Other Ambulatory Visit: Payer: Self-pay | Admitting: Family Medicine

## 2016-01-21 ENCOUNTER — Ambulatory Visit: Payer: BLUE CROSS/BLUE SHIELD | Admitting: Family Medicine

## 2016-01-22 ENCOUNTER — Encounter: Payer: Self-pay | Admitting: Family Medicine

## 2016-01-22 ENCOUNTER — Ambulatory Visit (INDEPENDENT_AMBULATORY_CARE_PROVIDER_SITE_OTHER): Payer: BLUE CROSS/BLUE SHIELD | Admitting: Family Medicine

## 2016-01-22 VITALS — BP 122/81 | HR 88 | Temp 98.1°F | Resp 16 | Ht 65.0 in | Wt 165.0 lb

## 2016-01-22 DIAGNOSIS — E1142 Type 2 diabetes mellitus with diabetic polyneuropathy: Secondary | ICD-10-CM | POA: Diagnosis not present

## 2016-01-22 LAB — BASIC METABOLIC PANEL
BUN: 10 mg/dL (ref 7–25)
CHLORIDE: 100 mmol/L (ref 98–110)
CO2: 27 mmol/L (ref 20–31)
CREATININE: 0.67 mg/dL (ref 0.50–1.05)
Calcium: 9.8 mg/dL (ref 8.6–10.4)
Glucose, Bld: 189 mg/dL — ABNORMAL HIGH (ref 65–99)
POTASSIUM: 4.4 mmol/L (ref 3.5–5.3)
Sodium: 138 mmol/L (ref 135–146)

## 2016-01-22 NOTE — Progress Notes (Signed)
   Subjective:    Patient ID: Dana Payne, female    DOB: [birth year], 50 y.o.   MRN: [id number]  HPI DM- chronic problem, on Metformin daily.  On ACE for renal protection.  UTD on foot exam, eye exam (due in December).  Pt has gained 5 lbs since last visit.  Pt is exercising regularly.  Not checking CBGs at home.  Denies symptomatic lows.  No worsening numbness/tingling of hands/feet.  No CP, SOB, HAs, visual changes, edema, abd pain, N/V.    Review of Systems For ROS see HPI     Objective:   Physical Exam  Constitutional: She is oriented to person, place, and time. She appears well-developed and well-nourished. No distress.  HENT:  Head: Normocephalic and atraumatic.  Eyes: Conjunctivae and EOM are normal. Pupils are equal, round, and reactive to light.  Neck: Normal range of motion. Neck supple. No thyromegaly present.  Cardiovascular: Normal rate, regular rhythm, normal heart sounds and intact distal pulses.   No murmur heard. Pulmonary/Chest: Effort normal and breath sounds normal. No respiratory distress.  Abdominal: Soft. She exhibits no distension. There is no tenderness.  Musculoskeletal: She exhibits no edema.  Lymphadenopathy:    She has no cervical adenopathy.  Neurological: She is alert and oriented to person, place, and time.  Skin: Skin is warm and dry.  Psychiatric: She has a normal mood and affect. Her behavior is normal.  Vitals reviewed.         Assessment & Plan:

## 2016-01-22 NOTE — Assessment & Plan Note (Signed)
Chronic problem.  Currently on Metformin.  UTD on foot exam, eye exam.  On ACE for renal protection.  Applauded her efforts at healthy diet and regular exercise.  Check labs.  Adjust meds prn

## 2016-01-22 NOTE — Patient Instructions (Signed)
Schedule your complete physical in 3-4 months We'll notify you of your lab results and make any changes if needed Keep up the good work on healthy diet and regular exercise- you look great!!! You are due for your eye exam in December Call with any questions or concerns Happy Fall!!!

## 2016-01-22 NOTE — Progress Notes (Signed)
Pre visit review using our clinic review tool, if applicable. No additional management support is needed unless otherwise documented below in the visit note. 

## 2016-01-23 LAB — HEMOGLOBIN A1C
Hgb A1c MFr Bld: 8.7 % — ABNORMAL HIGH (ref <5.7)
MEAN PLASMA GLUCOSE: 203 mg/dL

## 2016-02-06 ENCOUNTER — Telehealth: Payer: Self-pay | Admitting: Family Medicine

## 2016-02-06 MED ORDER — LISINOPRIL-HYDROCHLOROTHIAZIDE 20-12.5 MG PO TABS: 1.0000 | ORAL_TABLET | Freq: Every day | ORAL | 1 refills | Status: DC

## 2016-02-06 MED ORDER — SIMVASTATIN 40 MG PO TABS: 40.0000 mg | ORAL_TABLET | Freq: Every day | ORAL | 1 refills | Status: DC

## 2016-02-06 NOTE — Telephone Encounter (Signed)
Medication filled to pharmacy as requested.   

## 2016-02-06 NOTE — Telephone Encounter (Signed)
Patient calling to request 90 day refills of the following medications:  lisinopril-hydrochlorothiazide (PRINZIDE,ZESTORETIC) 20-12.5 MG tablet  simvastatin (ZOCOR) 40 MG tablet  Preferred [facility], [place]

## 2016-03-05 LAB — HM MAMMOGRAPHY

## 2016-03-08 ENCOUNTER — Encounter: Payer: Self-pay | Admitting: General Practice

## 2016-03-26 ENCOUNTER — Encounter: Payer: Self-pay | Admitting: Gynecology

## 2016-03-26 ENCOUNTER — Ambulatory Visit (INDEPENDENT_AMBULATORY_CARE_PROVIDER_SITE_OTHER): Payer: BLUE CROSS/BLUE SHIELD | Admitting: Gynecology

## 2016-03-26 VITALS — BP 128/76 | Ht 65.0 in | Wt 168.0 lb

## 2016-03-26 DIAGNOSIS — Z01411 Encounter for gynecological examination (general) (routine) with abnormal findings: Secondary | ICD-10-CM

## 2016-03-26 DIAGNOSIS — G4700 Insomnia, unspecified: Secondary | ICD-10-CM | POA: Insufficient documentation

## 2016-03-26 DIAGNOSIS — Z8639 Personal history of other endocrine, nutritional and metabolic disease: Secondary | ICD-10-CM | POA: Diagnosis not present

## 2016-03-26 MED ORDER — DEXTROMETHORPHAN HBR 15 MG/5ML PO SYRP: 10.0000 mL | ORAL_SOLUTION | Freq: Four times a day (QID) | ORAL | 0 refills | Status: DC | PRN

## 2016-03-26 MED ORDER — ZOLPIDEM TARTRATE 10 MG PO TABS: 10.0000 mg | ORAL_TABLET | Freq: Every evening | ORAL | 3 refills | Status: DC | PRN

## 2016-03-26 NOTE — Progress Notes (Signed)
Dana Payne 09/12/1965 [id number]   History:    50 y.o.  for annual gyn exam with complaining of insomnia, tiredness and fatigue. Also past few day she's had a nonproductive cough and felt that she had a low-grade temperature a few days ago at home. Patient's PCP Dr. Birdie Riddle in the summer did her blood work. Patient menopausal on no hormone replacement therapy.Patient with prior history of total abdominal hysterectomy.She had a past history of CO2 laser of her cervix by another provider many years ago and I do not have the grade of dysplasia report but her Pap smears have been normal .Patient stated in 2012 she had a colonoscopy because of rectal bleeding and there was some form of colonic inflammation but no polyps. She did mention that she has an aunt with colon cancer an uncle with colon polyps in her father was recently diagnosed with colon polyps.  Past medical history,surgical history, family history and social history were all reviewed and documented in the EPIC chart.  Gynecologic History No LMP recorded. Patient has had a hysterectomy. Contraception: status post hysterectomy Last Pap: 2014. Results were: normal Last mammogram: 2017. Results were: normal  Obstetric History OB History  Gravida Para Term Preterm AB Living  0         0  SAB TAB Ectopic Multiple Live Births                    ROS: A ROS was performed and pertinent positives and negatives are included in the history.  GENERAL: No fevers or chills. HEENT: No change in vision, no earache, sore throat or sinus congestion. NECK: No pain or stiffness. CARDIOVASCULAR: No chest pain or pressure. No palpitations. PULMONARY: No shortness of breath, cough or wheeze. GASTROINTESTINAL: No abdominal pain, nausea, vomiting or diarrhea, melena or bright red blood per rectum. GENITOURINARY: No urinary frequency, urgency, hesitancy or dysuria. MUSCULOSKELETAL: No joint or muscle pain, no back pain, no recent trauma. DERMATOLOGIC: No  rash, no itching, no lesions. ENDOCRINE: No polyuria, polydipsia, no heat or cold intolerance. No recent change in weight. HEMATOLOGICAL: No anemia or easy bruising or bleeding. NEUROLOGIC: No headache, seizures, numbness, tingling or weakness. PSYCHIATRIC: No depression, no loss of interest in normal activity or change in sleep pattern.     Exam: chaperone present  BP 128/76   Ht 5\' 5"  (1.651 m)   Wt 168 lb (76.2 kg)   BMI 27.96 kg/m   Body mass index is 27.96 kg/m.  General appearance : Well developed well nourished female. No acute distress HEENT: Eyes: no retinal hemorrhage or exudates,  Neck supple, trachea midline, no carotid bruits, no thyroidmegaly Lungs: Clear to auscultation, no rhonchi or wheezes, or rib retractions  Heart: Regular rate and rhythm, no murmurs or gallops Breast:Examined in sitting and supine position were symmetrical in appearance, no palpable masses or tenderness,  no skin retraction, no nipple inversion, no nipple discharge, no skin discoloration, no axillary or supraclavicular lymphadenopathy Abdomen: no palpable masses or tenderness, no rebound or guarding Extremities: no edema or skin discoloration or tenderness  Pelvic:  Bartholin, Urethra, Skene Glands: Within normal limits             Vagina: No gross lesions or discharge  Cervix: Absent  Uterus  absent  Adnexa  Without masses or tenderness  Anus and perineum  normal   Rectovaginal  normal sphincter tone without palpated masses or tenderness  Hemoccult patient to schedule her colonoscopy since it was 5 years ago and polyps had been noted     Assessment/Plan:  50 y.o. female for annual exam who suffer from insomnia as a result of her stress at her work and working straight without taking any  days off. I'm going to give her a note to take her at her work for 3 days to rest. For her insomnia going to prescribe Ambien 10 mg to take 1 by mouth daily at bedtime when necessary. Nonproductive  cough I will prescribe her dextromethorphan 50 mg/5 cc 4 times a day when necessary. If this does not improve in the next few day she will return back to the office for further evaluation may be needed. This may be viral induced. Because her tiredness and fatigue were going to check her CBC along with a comprehensive metabolic panel and a vitamin D level. She was reminded to contact her gastroenterologist for her overdue colonoscopy especially with her personal history: Polyps and family history. She was reminded of the importance of calcium vitamin D and weightbearing exercises for ostia porosis prevention. She is also reminded of the importance of monthly breast exams.   Terrance Mass MD, 11:50 AM 03/26/2016

## 2016-03-26 NOTE — Patient Instructions (Signed)
Insomnia Insomnia is a sleep disorder that makes it difficult to fall asleep or to stay asleep. Insomnia can cause tiredness (fatigue), low energy, difficulty concentrating, mood swings, and poor performance at work or school. There are three different ways to classify insomnia:  Difficulty falling asleep.  Difficulty staying asleep.  Waking up too early in the morning. Any type of insomnia can be long-term (chronic) or short-term (acute). Both are common. Short-term insomnia usually lasts for three months or less. Chronic insomnia occurs at least three times a week for longer than three months. What are the causes? Insomnia may be caused by another condition, situation, or substance, such as:  Anxiety.  Certain medicines.  Gastroesophageal reflux disease (GERD) or other gastrointestinal conditions.  Asthma or other breathing conditions.  Restless legs syndrome, sleep apnea, or other sleep disorders.  Chronic pain.  Menopause. This may include hot flashes.  Stroke.  Abuse of alcohol, tobacco, or illegal drugs.  Depression.  Caffeine.  Neurological disorders, such as Alzheimer disease.  An overactive thyroid (hyperthyroidism). The cause of insomnia may not be known. What increases the risk? Risk factors for insomnia include:  Gender. Women are more commonly affected than men.  Age. Insomnia is more common as you get older.  Stress. This may involve your professional or personal life.  Income. Insomnia is more common in people with lower income.  Lack of exercise.  Irregular work schedule or night shifts.  Traveling between different time zones. What are the signs or symptoms? If you have insomnia, trouble falling asleep or trouble staying asleep is the main symptom. This may lead to other symptoms, such as:  Feeling fatigued.  Feeling nervous about going to sleep.  Not feeling rested in the morning.  Having trouble concentrating.  Feeling irritable,  anxious, or depressed. How is this treated? Treatment for insomnia depends on the cause. If your insomnia is caused by an underlying condition, treatment will focus on addressing the condition. Treatment may also include:  Medicines to help you sleep.  Counseling or therapy.  Lifestyle adjustments. Follow these instructions at home:  Take medicines only as directed by your health care provider.  Keep regular sleeping and waking hours. Avoid naps.  Keep a sleep diary to help you and your health care provider figure out what could be causing your insomnia. Include:  When you sleep.  When you wake up during the night.  How well you sleep.  How rested you feel the next day.  Any side effects of medicines you are taking.  What you eat and drink.  Make your bedroom a comfortable place where it is easy to fall asleep:  Put up shades or special blackout curtains to block light from outside.  Use a white noise machine to block noise.  Keep the temperature cool.  Exercise regularly as directed by your health care provider. Avoid exercising right before bedtime.  Use relaxation techniques to manage stress. Ask your health care provider to suggest some techniques that may work well for you. These may include:  Breathing exercises.  Routines to release muscle tension.  Visualizing peaceful scenes.  Cut back on alcohol, caffeinated beverages, and cigarettes, especially close to bedtime. These can disrupt your sleep.  Do not overeat or eat spicy foods right before bedtime. This can lead to digestive discomfort that can make it hard for you to sleep.  Limit screen use before bedtime. This includes:  Watching TV.  Using your smartphone, tablet, and computer.  Stick to a   routine. This can help you fall asleep faster. Try to do a quiet activity, brush your teeth, and go to bed at the same time each night.  Get out of bed if you are still awake after 15 minutes of trying to  sleep. Keep the lights down, but try reading or doing a quiet activity. When you feel sleepy, go back to bed.  Make sure that you drive carefully. Avoid driving if you feel very sleepy.  Keep all follow-up appointments as directed by your health care provider. This is important. Contact a health care provider if:  You are tired throughout the day or have trouble in your daily routine due to sleepiness.  You continue to have sleep problems or your sleep problems get worse. Get help right away if:  You have serious thoughts about hurting yourself or someone else. This information is not intended to replace advice given to you by your health care provider. Make sure you discuss any questions you have with your health care provider. Document Released: 04/09/2000 Document Revised: 09/12/2015 Document Reviewed: 01/11/2014 Elsevier Interactive Patient Education  2017 [employer] Inc.  

## 2016-04-01 LAB — HM DIABETES EYE EXAM

## 2016-04-06 ENCOUNTER — Encounter: Payer: Self-pay | Admitting: General Practice

## 2016-06-08 ENCOUNTER — Ambulatory Visit: Payer: BLUE CROSS/BLUE SHIELD | Admitting: Family Medicine

## 2016-06-10 ENCOUNTER — Encounter: Payer: Self-pay | Admitting: Gynecology

## 2016-06-10 ENCOUNTER — Ambulatory Visit (INDEPENDENT_AMBULATORY_CARE_PROVIDER_SITE_OTHER): Payer: BLUE CROSS/BLUE SHIELD | Admitting: Family Medicine

## 2016-06-10 ENCOUNTER — Encounter: Payer: Self-pay | Admitting: Family Medicine

## 2016-06-10 ENCOUNTER — Ambulatory Visit (INDEPENDENT_AMBULATORY_CARE_PROVIDER_SITE_OTHER): Payer: BLUE CROSS/BLUE SHIELD | Admitting: Gynecology

## 2016-06-10 VITALS — BP 124/80 | Ht 65.0 in | Wt 168.0 lb

## 2016-06-10 VITALS — BP 110/81 | HR 75 | Temp 98.3°F | Resp 16 | Ht 65.0 in | Wt 164.4 lb

## 2016-06-10 DIAGNOSIS — N760 Acute vaginitis: Secondary | ICD-10-CM

## 2016-06-10 DIAGNOSIS — I10 Essential (primary) hypertension: Secondary | ICD-10-CM

## 2016-06-10 DIAGNOSIS — L292 Pruritus vulvae: Secondary | ICD-10-CM

## 2016-06-10 DIAGNOSIS — E1142 Type 2 diabetes mellitus with diabetic polyneuropathy: Secondary | ICD-10-CM | POA: Diagnosis not present

## 2016-06-10 DIAGNOSIS — R300 Dysuria: Secondary | ICD-10-CM

## 2016-06-10 DIAGNOSIS — E785 Hyperlipidemia, unspecified: Secondary | ICD-10-CM

## 2016-06-10 DIAGNOSIS — Z113 Encounter for screening for infections with a predominantly sexual mode of transmission: Secondary | ICD-10-CM

## 2016-06-10 DIAGNOSIS — N898 Other specified noninflammatory disorders of vagina: Secondary | ICD-10-CM | POA: Diagnosis not present

## 2016-06-10 DIAGNOSIS — B9689 Other specified bacterial agents as the cause of diseases classified elsewhere: Secondary | ICD-10-CM

## 2016-06-10 LAB — BASIC METABOLIC PANEL
BUN: 10 mg/dL (ref 6–23)
CHLORIDE: 98 meq/L (ref 96–112)
CO2: 33 mEq/L — ABNORMAL HIGH (ref 19–32)
Calcium: 9.8 mg/dL (ref 8.4–10.5)
Creatinine, Ser: 0.69 mg/dL (ref 0.40–1.20)
GFR: 115.1 mL/min (ref >60.00)
Glucose, Bld: 246 mg/dL — ABNORMAL HIGH (ref 70–99)
POTASSIUM: 4.3 meq/L (ref 3.5–5.1)
Sodium: 137 mEq/L (ref 135–145)

## 2016-06-10 LAB — URINALYSIS W MICROSCOPIC + REFLEX CULTURE
Bilirubin Urine: NEGATIVE
Casts: NONE SEEN [LPF]
Crystals: NONE SEEN [HPF]
Glucose, UA: NEGATIVE
HGB URINE DIPSTICK: NEGATIVE
Ketones, ur: NEGATIVE
LEUKOCYTES UA: NEGATIVE
NITRITE: NEGATIVE
PH: 6 (ref 5.0–8.0)
Protein, ur: NEGATIVE
RBC / HPF: NONE SEEN RBC/HPF (ref <=2)
Specific Gravity, Urine: 1.015 (ref 1.001–1.035)
Yeast: NONE SEEN [HPF]

## 2016-06-10 LAB — HEPATIC FUNCTION PANEL
ALK PHOS: 83 U/L (ref 39–117)
ALT: 24 U/L (ref 0–35)
AST: 16 U/L (ref 0–37)
Albumin: 4.9 g/dL (ref 3.5–5.2)
BILIRUBIN TOTAL: 0.6 mg/dL (ref 0.2–1.2)
Bilirubin, Direct: 0.1 mg/dL (ref 0.0–0.3)
Total Protein: 7.4 g/dL (ref 6.0–8.3)

## 2016-06-10 LAB — CBC WITH DIFFERENTIAL/PLATELET
BASOS ABS: 0 10*3/uL (ref 0.0–0.1)
BASOS PCT: 0.7 % (ref 0.0–3.0)
EOS PCT: 1.2 % (ref 0.0–5.0)
Eosinophils Absolute: 0.1 10*3/uL (ref 0.0–0.7)
HCT: 42.4 % (ref 36.0–46.0)
Hemoglobin: 14.5 g/dL (ref 12.0–15.0)
LYMPHS ABS: 2.4 10*3/uL (ref 0.7–4.0)
Lymphocytes Relative: 33.2 % (ref 12.0–46.0)
MCHC: 34.3 g/dL (ref 30.0–36.0)
MCV: 86.4 fl (ref 78.0–100.0)
MONOS PCT: 6.9 % (ref 3.0–12.0)
Monocytes Absolute: 0.5 10*3/uL (ref 0.1–1.0)
NEUTROS ABS: 4.2 10*3/uL (ref 1.4–7.7)
NEUTROS PCT: 58 % (ref 43.0–77.0)
PLATELETS: 265 10*3/uL (ref 150.0–400.0)
RBC: 4.9 Mil/uL (ref 3.87–5.11)
RDW: 12.6 % (ref 11.5–15.5)
WBC: 7.2 10*3/uL (ref 4.0–10.5)

## 2016-06-10 LAB — WET PREP FOR TRICH, YEAST, CLUE
CLUE CELLS WET PREP: NONE SEEN
Trich, Wet Prep: NONE SEEN
Yeast Wet Prep HPF POC: NONE SEEN

## 2016-06-10 LAB — TSH: TSH: 1.52 u[IU]/mL (ref 0.35–4.50)

## 2016-06-10 LAB — LIPID PANEL
CHOLESTEROL: 151 mg/dL (ref 0–200)
HDL: 46.4 mg/dL (ref >39.00)
LDL Cholesterol: 73 mg/dL (ref 0–99)
NonHDL: 104.56
TRIGLYCERIDES: 159 mg/dL — AB (ref 0.0–149.0)
Total CHOL/HDL Ratio: 3
VLDL: 31.8 mg/dL (ref 0.0–40.0)

## 2016-06-10 LAB — HEMOGLOBIN A1C: Hgb A1c MFr Bld: 10.4 % — ABNORMAL HIGH (ref 4.6–6.5)

## 2016-06-10 LAB — HIV ANTIBODY (ROUTINE TESTING W REFLEX): HIV: NONREACTIVE

## 2016-06-10 MED ORDER — TINIDAZOLE 500 MG PO TABS: ORAL_TABLET | ORAL | 0 refills | Status: DC

## 2016-06-10 MED ORDER — FLUTICASONE PROPIONATE 50 MCG/ACT NA SUSP: 2.0000 | Freq: Every day | NASAL | 6 refills | Status: DC

## 2016-06-10 NOTE — Progress Notes (Signed)
   Patient is a 51 year old that presented to the office stating that since last week she was having some vaginal irritation and a slightly yellow discharge with some bulbar pruritus and urinary frequency. She denied any fever, chills, nausea, vomiting or any back pain. Patient with prior hysterectomy. She has informing that her husband had been unfaithful back in November and then she has started to have intimacy with him again and would like to have an STD screen as well.  Back: No CVA tenderness Abdomen: Soft nontender no rebound or guarding Pelvic: Bartholin urethra Skene was within normal limits Vagina clear discharge odor vaginal cuff intact Bimanual exam no palpable mass or tenderness Rectal exam not done  Wet prep moderate bacteria few white blood cells  GC and Chlamydia culture obtained results pending at time of this dictation  Assessment/plan: Patient with clinical evidence highly suspicious for bacterial vaginosis for this reason she'll be treated with Tindamax 500 mg capsule she will take 4 tablets today repeat in 24 hours. For external pruritus she can by Monistat 3 to apply externally for the next 3 days. GC and Chlamydia culture was obtained today as well as an HIV, RPR, hepatitis B and C to complete the STD screen.

## 2016-06-10 NOTE — Patient Instructions (Signed)
Bacterial Vaginosis Bacterial vaginosis is a vaginal infection that occurs when the normal balance of bacteria in the vagina is disrupted. It results from an overgrowth of certain bacteria. This is the most common vaginal infection among women ages 15-44. Because bacterial vaginosis increases your risk for STIs (sexually transmitted infections), getting treated can help reduce your risk for chlamydia, gonorrhea, herpes, and HIV (human immunodeficiency virus). Treatment is also important for preventing complications in pregnant women, because this condition can cause an early (premature) delivery. What are the causes? This condition is caused by an increase in harmful bacteria that are normally present in small amounts in the vagina. However, the reason that the condition develops is not fully understood. What increases the risk? The following factors may make you more likely to develop this condition:  Having a new sexual partner or multiple sexual partners.  Having unprotected sex.  Douching.  Having an intrauterine device (IUD).  Smoking.  Drug and alcohol abuse.  Taking certain antibiotic medicines.  Being pregnant.  You cannot get bacterial vaginosis from toilet seats, bedding, swimming pools, or contact with objects around you. What are the signs or symptoms? Symptoms of this condition include:  Grey or white vaginal discharge. The discharge can also be watery or foamy.  A fish-like odor with discharge, especially after sexual intercourse or during menstruation.  Itching in and around the vagina.  Burning or pain with urination.  Some women with bacterial vaginosis have no signs or symptoms. How is this diagnosed? This condition is diagnosed based on:  Your medical history.  A physical exam of the vagina.  Testing a sample of vaginal fluid under a microscope to look for a large amount of bad bacteria or abnormal cells. Your health care provider may use a cotton swab  or a small wooden spatula to collect the sample.  How is this treated? This condition is treated with antibiotics. These may be given as a pill, a vaginal cream, or a medicine that is put into the vagina (suppository). If the condition comes back after treatment, a second round of antibiotics may be needed. Follow these instructions at home: Medicines  Take over-the-counter and prescription medicines only as told by your health care provider.  Take or use your antibiotic as told by your health care provider. Do not stop taking or using the antibiotic even if you start to feel better. General instructions  If you have a female sexual partner, tell her that you have a vaginal infection. She should see her health care provider and be treated if she has symptoms. If you have a female sexual partner, he does not need treatment.  During treatment: ? Avoid sexual activity until you finish treatment. ? Do not douche. ? Avoid alcohol as directed by your health care provider. ? Avoid breastfeeding as directed by your health care provider.  Drink enough water and fluids to keep your urine clear or pale yellow.  Keep the area around your vagina and rectum clean. ? Wash the area daily with warm water. ? Wipe yourself from front to back after using the toilet.  Keep all follow-up visits as told by your health care provider. This is important. How is this prevented?  Do not douche.  Wash the outside of your vagina with warm water only.  Use protection when having sex. This includes latex condoms and dental dams.  Limit how many sexual partners you have. To help prevent bacterial vaginosis, it is best to have sex with just   one partner (monogamous).  Make sure you and your sexual partner are tested for STIs.  Wear cotton or cotton-lined underwear.  Avoid wearing tight pants and pantyhose, especially during summer.  Limit the amount of alcohol that you drink.  Do not use any products that  contain nicotine or tobacco, such as cigarettes and e-cigarettes. If you need help quitting, ask your health care provider.  Do not use illegal drugs. Where to find more information:  Centers for Disease Control and Prevention: www.cdc.gov/std  American Sexual Health Association (ASHA): www.ashastd.org  [place] Department of Health and Human Services, Office on Women's Health: www.womenshealth.gov/ or https://www.womenshealth.gov/a-z-topics/bacterial-vaginosis Contact a health care provider if:  Your symptoms do not improve, even after treatment.  You have more discharge or pain when urinating.  You have a fever.  You have pain in your abdomen.  You have pain during sex.  You have vaginal bleeding between periods. Summary  Bacterial vaginosis is a vaginal infection that occurs when the normal balance of bacteria in the vagina is disrupted.  Because bacterial vaginosis increases your risk for STIs (sexually transmitted infections), getting treated can help reduce your risk for chlamydia, gonorrhea, herpes, and HIV (human immunodeficiency virus). Treatment is also important for preventing complications in pregnant women, because the condition can cause an early (premature) delivery.  This condition is treated with antibiotic medicines. These may be given as a pill, a vaginal cream, or a medicine that is put into the vagina (suppository). This information is not intended to replace advice given to you by your health care provider. Make sure you discuss any questions you have with your health care provider. Document Released: 04/12/2005 Document Revised: 12/27/2015 Document Reviewed: 12/27/2015 Elsevier Interactive Patient Education  2017 [employer]vier Inc.  

## 2016-06-10 NOTE — Patient Instructions (Signed)
Schedule your complete physical in 3-4 months We'll notify you of your lab results and make any changes if needed Try and make healthy food choices and get regular exercise- you can do it!!! Call with any questions or concerns [place]!!!

## 2016-06-10 NOTE — Progress Notes (Signed)
Pre visit review using our clinic review tool, if applicable. No additional management support is needed unless otherwise documented below in the visit note. 

## 2016-06-10 NOTE — Assessment & Plan Note (Signed)
Chronic problem.  Pt has hx of noncompliance w/ tx plan.  She was supposed to restart Januvia but has not.  Not exercising, admits to poor diet and very elevated sugars.  Asymptomatic.  Stressed need for med compliance, low carb diet and regular exercise.  UTD on foot exam, eye exam.  On ACE for renal protection.  Check labs.  Adjust meds prn

## 2016-06-10 NOTE — Addendum Note (Signed)
Addended by: Joaquin Music on: 06/10/2016 10:54 AM   Modules accepted: Orders

## 2016-06-10 NOTE — Assessment & Plan Note (Signed)
Well controlled today.  Asymptomatic.  Check labs.  No anticipated med changes.  Will follow. 

## 2016-06-10 NOTE — Assessment & Plan Note (Signed)
Chronic problem.  Tolerating statin w/o difficulty.  Check labs.  Adjust meds prn  

## 2016-06-10 NOTE — Progress Notes (Signed)
   Subjective:    Patient ID: Dana Payne, female    DOB: 19-Oct-1965, 51 y.o.   MRN: [id number]  HPI HTN- chronic problem, on Lisinopril HCTZ w/ good BP control today.  No CP, SOB, HAs, visual changes, edema.  Hyperlipidemia- chronic problem, on Simvastatin.  No regular exercise.  Plans to start when the weather warms.  No abd pain, N/V.  DM- chronic problem, on Metformin twice daily.  UTD on foot exam, eye exam, on ACE for renal protection.  Pt has lost 4 lbs since last visit.  Pt reports home CBGs have been 'high'.  Pt reports CBGs ranging from 180-280.  Pt states she is stress eating.  Denies numbness/tingling of hands/feet.  Never restarted the Januvia after last visit.   Review of Systems For ROS see HPI     Objective:   Physical Exam  Constitutional: She is oriented to person, place, and time. She appears well-developed and well-nourished. No distress.  HENT:  Head: Normocephalic and atraumatic.  Eyes: Conjunctivae and EOM are normal. Pupils are equal, round, and reactive to light.  Neck: Normal range of motion. Neck supple. No thyromegaly present.  Cardiovascular: Normal rate, regular rhythm, normal heart sounds and intact distal pulses.   No murmur heard. Pulmonary/Chest: Effort normal and breath sounds normal. No respiratory distress.  Abdominal: Soft. She exhibits no distension. There is no tenderness.  Musculoskeletal: She exhibits no edema.  Lymphadenopathy:    She has no cervical adenopathy.  Neurological: She is alert and oriented to person, place, and time.  Skin: Skin is warm and dry.  Psychiatric: She has a normal mood and affect. Her behavior is normal.  Vitals reviewed.         Assessment & Plan:

## 2016-06-11 ENCOUNTER — Other Ambulatory Visit: Payer: Self-pay | Admitting: Family Medicine

## 2016-06-11 ENCOUNTER — Telehealth: Payer: Self-pay | Admitting: Family Medicine

## 2016-06-11 DIAGNOSIS — E1142 Type 2 diabetes mellitus with diabetic polyneuropathy: Secondary | ICD-10-CM

## 2016-06-11 LAB — GC/CHLAMYDIA PROBE AMP
CT PROBE, AMP APTIMA: NOT DETECTED
GC PROBE AMP APTIMA: NOT DETECTED

## 2016-06-11 LAB — HEPATITIS B SURFACE ANTIBODY,QUALITATIVE: HEP B S AB: NEGATIVE

## 2016-06-11 LAB — HEPATITIS C ANTIBODY: HCV Ab: NEGATIVE

## 2016-06-11 LAB — RPR

## 2016-06-11 NOTE — Telephone Encounter (Signed)
Noted, pt was advised on result note.

## 2016-06-11 NOTE — Telephone Encounter (Signed)
Patient returning call about lab results. Please call back.  ° °Thank you °

## 2016-06-11 NOTE — Progress Notes (Signed)
Called pt and lmovm to return call.

## 2016-06-12 LAB — URINE CULTURE: Organism ID, Bacteria: NO GROWTH

## 2016-06-17 ENCOUNTER — Telehealth: Payer: Self-pay | Admitting: *Deleted

## 2016-06-17 ENCOUNTER — Ambulatory Visit: Payer: BLUE CROSS/BLUE SHIELD | Admitting: Family Medicine

## 2016-06-17 NOTE — Telephone Encounter (Signed)
Patient called back to get lab results because she said she did not fully understand everything from the voicemail.   I went through her results, and talked with her about restarting the Januvia. She is also aware of being referred to Endocrinology for management of diabetes.    Patient is unsure of dosage last time she was on Januvia - in the medication history I see 100mg , but I want to confirm with PCP that this is the dosage she wants patient to start back with.   Patient is aware that medication will be sent in when confirmed.

## 2016-06-18 MED ORDER — SITAGLIPTIN PHOSPHATE 100 MG PO TABS: 100.0000 mg | ORAL_TABLET | Freq: Every day | ORAL | 6 refills | Status: DC

## 2016-06-18 NOTE — Telephone Encounter (Signed)
Medication filled to pharmacy as requested.   

## 2016-06-18 NOTE — Telephone Encounter (Signed)
Yes- 100mg  of Januvia daily

## 2016-07-01 ENCOUNTER — Ambulatory Visit (INDEPENDENT_AMBULATORY_CARE_PROVIDER_SITE_OTHER): Payer: BLUE CROSS/BLUE SHIELD | Admitting: Endocrinology

## 2016-07-01 ENCOUNTER — Encounter: Payer: Self-pay | Admitting: Endocrinology

## 2016-07-01 VITALS — BP 112/76 | HR 86 | Ht 65.0 in | Wt 165.0 lb

## 2016-07-01 DIAGNOSIS — E1142 Type 2 diabetes mellitus with diabetic polyneuropathy: Secondary | ICD-10-CM | POA: Diagnosis not present

## 2016-07-01 MED ORDER — SITAGLIPTIN PHOSPHATE 100 MG PO TABS: 100.0000 mg | ORAL_TABLET | Freq: Every day | ORAL | 6 refills | Status: DC

## 2016-07-01 NOTE — Patient Instructions (Addendum)
good diet and exercise significantly improve the control of your diabetes.  please let me know if you wish to be referred to a dietician.  high blood sugar is very risky to your health.  you should see an eye doctor and dentist every year.  It is very important to get all recommended vaccinations.   Controlling your blood pressure and cholesterol drastically reduces the damage diabetes does to your body.  Those who smoke should quit.  Please discuss these with your doctor.   I have sent a prescription to your pharmacy, to resume the [place].  Please continue the same metformin check your blood sugar 4 times a day.  vary the time of day when you check, between before the 3 meals, and at bedtime.  also check if you have symptoms of your blood sugar being too high or too low.  please keep a record of the readings and bring it to your next appointment here (or you can bring the meter itself).  You can write it on any piece of paper.  please call us sooner if your blood sugar goes below 70, or if you have a lot of readings over 200.   It is important to call if your blood sugar is high, so we can add another medication if necessary.  Please come back for a follow-up appointment in 6 weeks.      Diabetes Mellitus and Food It is important for you to manage your blood sugar (glucose) level. Your blood glucose level can be greatly affected by what you eat. Eating healthier foods in the appropriate amounts throughout the day at about the same time each day will help you control your blood glucose level. It can also help slow or prevent worsening of your diabetes mellitus. Healthy eating may even help you improve the level of your blood pressure and reach or maintain a healthy weight. General recommendations for healthful eating and cooking habits include:  Eating meals and snacks regularly. Avoid going long periods of time without eating to lose weight.  Eating a diet that consists mainly of plant-based  foods, such as fruits, vegetables, nuts, legumes, and whole grains.  Using low-heat cooking methods, such as baking, instead of high-heat cooking methods, such as deep frying. Work with your dietitian to make sure you understand how to use the Nutrition Facts information on food labels. How can food affect me? Carbohydrates  Carbohydrates affect your blood glucose level more than any other type of food. Your dietitian will help you determine how many carbohydrates to eat at each meal and teach you how to count carbohydrates. Counting carbohydrates is important to keep your blood glucose at a healthy level, especially if you are using insulin or taking certain medicines for diabetes mellitus. Alcohol  Alcohol can cause sudden decreases in blood glucose (hypoglycemia), especially if you use insulin or take certain medicines for diabetes mellitus. Hypoglycemia can be a life-threatening condition. Symptoms of hypoglycemia (sleepiness, dizziness, and disorientation) are similar to symptoms of having too much alcohol. If your health care provider has given you approval to drink alcohol, do so in moderation and use the following guidelines:  Women should not have more than one drink per day, and men should not have more than two drinks per day. One drink is equal to:  12 oz of beer.  5 oz of wine.  1 oz of hard liquor.  Do not drink on an empty stomach.  Keep yourself hydrated. Have water, diet soda, or  unsweetened iced tea.  Regular soda, juice, and other mixers might contain a lot of carbohydrates and should be counted. What foods are not recommended? As you make food choices, it is important to remember that all foods are not the same. Some foods have fewer nutrients per serving than other foods, even though they might have the same number of calories or carbohydrates. It is difficult to get your body what it needs when you eat foods with fewer nutrients. Examples of foods that you should avoid  that are high in calories and carbohydrates but low in nutrients include:  Trans fats (most processed foods list trans fats on the Nutrition Facts label).  Regular soda.  Juice.  Candy.  Sweets, such as cake, pie, doughnuts, and cookies.  Fried foods. What foods can I eat? Eat nutrient-rich foods, which will nourish your body and keep you healthy. The food you should eat also will depend on several factors, including:  The calories you need.  The medicines you take.  Your weight.  Your blood glucose level.  Your blood pressure level.  Your cholesterol level. You should eat a variety of foods, including:  Protein.  Lean cuts of meat.  Proteins low in saturated fats, such as fish, egg whites, and beans. Avoid processed meats.  Fruits and vegetables.  Fruits and vegetables that may help control blood glucose levels, such as apples, mangoes, and yams.  Dairy products.  Choose fat-free or low-fat dairy products, such as milk, yogurt, and cheese.  Grains, bread, pasta, and rice.  Choose whole grain products, such as multigrain bread, whole oats, and brown rice. These foods may help control blood pressure.  Fats.  Foods containing healthful fats, such as nuts, avocado, olive oil, canola oil, and fish. Does everyone with diabetes mellitus have the same meal plan? Because every person with diabetes mellitus is different, there is not one meal plan that works for everyone. It is very important that you meet with a dietitian who will help you create a meal plan that is just right for you. This information is not intended to replace advice given to you by your health care provider. Make sure you discuss any questions you have with your health care provider. Document Released: 01/07/2005 Document Revised: 09/18/2015 Document Reviewed: 03/09/2013 Elsevier Interactive Patient Education  2017 [employer].

## 2016-07-01 NOTE — Progress Notes (Signed)
Subjective:    Patient ID: Dana Payne, female    DOB: Sep 04, 1965, 51 y.o.   MRN: [id number]  HPI pt is referred by Dr Birdie Riddle, for diabetes.  Pt states DM was dx'ed in 2014; she has mild neuropathy of the lower extremities; she is unaware of any associated chronic complications; she has never been on insulin; pt says her diet and exercise are good; she has never had GDM (G0), pancreatitis, pancreatic surgery, severe hypoglycemia or DKA.  She takes metformin only.  She was rx'ed [place], but did not take.   Past Medical History:  Diagnosis Date  . Diabetes mellitus   . GERD (gastroesophageal reflux disease)   . HTN (hypertension)   . Hypercholesteremia   . Hypercholesterolemia   . Migraines   . Type II diabetes mellitus ([place])     Past Surgical History:  Procedure Laterality Date  . COLONOSCOPY  2012  . LASER ABLATION OF THE CERVIX     dysplasia  . PARTIAL HYSTERECTOMY      Social History   Social History  . Marital status: Single    Spouse name: N/A  . Number of children: N/A  . Years of education: N/A   Occupational History  . Not on file.   Social History Main Topics  . Smoking status: Never Smoker  . Smokeless tobacco: Never Used  . Alcohol use No  . Drug use: No  . Sexual activity: Yes    Birth control/ protection: None     Comment: HYSTERECTOMY   Other Topics Concern  . Not on file   Social History Narrative  . No narrative on file    Current Outpatient Prescriptions on File Prior to Visit  Medication Sig Dispense Refill  . aspirin EC 81 MG tablet Take 81 mg by mouth daily.    . fluticasone (FLONASE) 50 MCG/ACT nasal spray Place 2 sprays into both nostrils daily. 16 g 6  . ibuprofen (ADVIL,MOTRIN) 600 MG tablet Take 1 tablet (600 mg total) by mouth every 6 (six) hours as needed. 30 tablet 0  . lisinopril-hydrochlorothiazide (PRINZIDE,ZESTORETIC) 20-12.5 MG tablet Take 1 tablet by mouth daily. 180 tablet 1  . metFORMIN (GLUCOPHAGE-XR) 500 MG 24 hr  tablet TAKE TWO TABLETS BY MOUTH ONCE DAILY WITH  BREAKFAST 180 tablet 1  . ONE TOUCH ULTRA TEST test strip USE TO TEST BLOOD SUGAR TWICE DAILY 100 each 2  . simvastatin (ZOCOR) 40 MG tablet Take 1 tablet (40 mg total) by mouth at bedtime. 90 tablet 1  . tinidazole (TINDAMAX) 500 MG tablet Take four tablets today and four tablets tomorrow at the same time 8 tablet 0  . Estradiol 10 MCG TABS vaginal tablet Place 1 tablet (10 mcg total) vaginally 2 (two) times a week. (Patient not taking: Reported on 07/01/2016) 8 tablet 11  . omeprazole (PRILOSEC) 20 MG capsule Take 1 capsule (20 mg total) by mouth daily. (Patient not taking: Reported on 07/01/2016) 30 capsule 3  . zolpidem (AMBIEN) 10 MG tablet Take 1 tablet (10 mg total) by mouth at bedtime as needed for sleep. 30 tablet 3   No current facility-administered medications on file prior to visit.     No Known Allergies  Family History  Problem Relation Age of Onset  . Colon polyps Maternal Aunt   . Colon polyps Paternal Uncle   . Colon cancer Maternal Aunt     in 20s when diagnosed.   Marland Kitchen Heart disease Father   . Diabetes Father   .  Diabetes Mother   . Diabetes Sister   . Diabetes Brother     BP 112/76   Pulse 86   Ht 5\' 5"  (1.651 m)   Wt 165 lb (74.8 kg)   SpO2 96%   BMI 27.46 kg/m    Review of Systems denies blurry vision, headache, chest pain, sob, n/v, excessive diaphoresis, depression, cold intolerance, rhinorrhea, and easy bruising.  She has gained weight. She has urinary frequency and leg cramps.      Objective:   Physical Exam VS: see vs page GEN: no distress HEAD: head: no deformity eyes: no periorbital swelling, no proptosis external nose and ears are normal mouth: no lesion seen NECK: supple, thyroid is not enlarged CHEST WALL: no deformity LUNGS: clear to auscultation CV: reg rate and rhythm, no murmur ABD: abdomen is soft, nontender.  no hepatosplenomegaly.  not distended.  no hernia.   MUSCULOSKELETAL: muscle  bulk and strength are grossly normal.  no obvious joint swelling.  gait is normal and steady.   EXTEMITIES: no deformity.  no ulcer on the feet.  feet are of normal color and temp.  no edema PULSES: dorsalis pedis intact bilat.  no carotid bruit NEURO:  cn 2-12 grossly intact.   readily moves all 4's.  sensation is intact to touch on the feet SKIN:  Normal texture and temperature.  No rash or suspicious lesion is visible.   NODES:  None palpable at the neck.  PSYCH: alert, well-oriented.  Does not appear anxious nor depressed.    Lab Results  Component Value Date   HGBA1C 10.4 (H) 06/10/2016   I personally reviewed electrocardiogram tracing (10/17/15): Indication: chest pain Impression: NSR.  No MI.  No hypertrophy. Compared to 2016, sinus pause is no longer seen.  I have reviewed outside records, and summarized: Pt was noted to have elevated a1c, and referred here.  Noncompliance with Celesta Gentile was noted.       Assessment & Plan:  Type 2 DM: severe exacerbation. I advised insulin, but she declines.  She is reluctant to evan add more oral meds.  Noncompliance with medication, and reluctance to take.  We will need to take rx in stages  Patient is advised the following: Patient Instructions  good diet and exercise significantly improve the control of your diabetes.  please let me know if you wish to be referred to a dietician.  high blood sugar is very risky to your health.  you should see an eye doctor and dentist every year.  It is very important to get all recommended vaccinations.   Controlling your blood pressure and cholesterol drastically reduces the damage diabetes does to your body.  Those who smoke should quit.  Please discuss these with your doctor.   I have sent a prescription to your pharmacy, to resume the [place].  Please continue the same metformin check your blood sugar 4 times a day.  vary the time of day when you check, between before the 3 meals, and at bedtime.  also  check if you have symptoms of your blood sugar being too high or too low.  please keep a record of the readings and bring it to your next appointment here (or you can bring the meter itself).  You can write it on any piece of paper.  please call us sooner if your blood sugar goes below 70, or if you have a lot of readings over 200.   It is important to call if your blood sugar is  high, so we can add another medication if necessary.  Please come back for a follow-up appointment in 6 weeks.      Diabetes Mellitus and Food It is important for you to manage your blood sugar (glucose) level. Your blood glucose level can be greatly affected by what you eat. Eating healthier foods in the appropriate amounts throughout the day at about the same time each day will help you control your blood glucose level. It can also help slow or prevent worsening of your diabetes mellitus. Healthy eating may even help you improve the level of your blood pressure and reach or maintain a healthy weight. General recommendations for healthful eating and cooking habits include:  Eating meals and snacks regularly. Avoid going long periods of time without eating to lose weight.  Eating a diet that consists mainly of plant-based foods, such as fruits, vegetables, nuts, legumes, and whole grains.  Using low-heat cooking methods, such as baking, instead of high-heat cooking methods, such as deep frying. Work with your dietitian to make sure you understand how to use the Nutrition Facts information on food labels. How can food affect me? Carbohydrates  Carbohydrates affect your blood glucose level more than any other type of food. Your dietitian will help you determine how many carbohydrates to eat at each meal and teach you how to count carbohydrates. Counting carbohydrates is important to keep your blood glucose at a healthy level, especially if you are using insulin or taking certain medicines for diabetes mellitus. Alcohol    Alcohol can cause sudden decreases in blood glucose (hypoglycemia), especially if you use insulin or take certain medicines for diabetes mellitus. Hypoglycemia can be a life-threatening condition. Symptoms of hypoglycemia (sleepiness, dizziness, and disorientation) are similar to symptoms of having too much alcohol. If your health care provider has given you approval to drink alcohol, do so in moderation and use the following guidelines:  Women should not have more than one drink per day, and men should not have more than two drinks per day. One drink is equal to:  12 oz of beer.  5 oz of wine.  1 oz of hard liquor.  Do not drink on an empty stomach.  Keep yourself hydrated. Have water, diet soda, or unsweetened iced tea.  Regular soda, juice, and other mixers might contain a lot of carbohydrates and should be counted. What foods are not recommended? As you make food choices, it is important to remember that all foods are not the same. Some foods have fewer nutrients per serving than other foods, even though they might have the same number of calories or carbohydrates. It is difficult to get your body what it needs when you eat foods with fewer nutrients. Examples of foods that you should avoid that are high in calories and carbohydrates but low in nutrients include:  Trans fats (most processed foods list trans fats on the Nutrition Facts label).  Regular soda.  Juice.  Candy.  Sweets, such as cake, pie, doughnuts, and cookies.  Fried foods. What foods can I eat? Eat nutrient-rich foods, which will nourish your body and keep you healthy. The food you should eat also will depend on several factors, including:  The calories you need.  The medicines you take.  Your weight.  Your blood glucose level.  Your blood pressure level.  Your cholesterol level. You should eat a variety of foods, including:  Protein.  Lean cuts of meat.  Proteins low in saturated fats, such as  fish,  egg whites, and beans. Avoid processed meats.  Fruits and vegetables.  Fruits and vegetables that may help control blood glucose levels, such as apples, mangoes, and yams.  Dairy products.  Choose fat-free or low-fat dairy products, such as milk, yogurt, and cheese.  Grains, bread, pasta, and rice.  Choose whole grain products, such as multigrain bread, whole oats, and brown rice. These foods may help control blood pressure.  Fats.  Foods containing healthful fats, such as nuts, avocado, olive oil, canola oil, and fish. Does everyone with diabetes mellitus have the same meal plan? Because every person with diabetes mellitus is different, there is not one meal plan that works for everyone. It is very important that you meet with a dietitian who will help you create a meal plan that is just right for you. This information is not intended to replace advice given to you by your health care provider. Make sure you discuss any questions you have with your health care provider. Document Released: 01/07/2005 Document Revised: 09/18/2015 Document Reviewed: 03/09/2013 Elsevier Interactive Patient Education  2017 [employer].

## 2016-08-05 ENCOUNTER — Telehealth: Payer: Self-pay | Admitting: Family Medicine

## 2016-08-05 NOTE — Telephone Encounter (Signed)
Patient Name: Dana Payne  DOB: 05-Nov-1965    Initial Comment Caller states c/o intermittent chest pain.   Nurse Assessment  Nurse: Orvan Seen, RN, Jacquilin Date/Time (Eastern Time): 08/05/2016 10:38:02 AM  Confirm and document reason for call. If symptomatic, describe symptoms. ---Caller states c/o intermittent chest pain. Last time she had it was 2 days ago. Lasts a couple of minutes and then passes.  Does the patient have any new or worsening symptoms? ---Yes  Will a triage be completed? ---Yes  Related visit to physician within the last 2 weeks? ---No  Does the PT have any chronic conditions? (i.e. diabetes, asthma, etc.) ---Yes  List chronic conditions. ---diabetes  Is the patient pregnant or possibly pregnant? (Ask all females between the ages of 72-55) ---No  Is this a behavioral health or substance abuse call? ---No     Guidelines    Guideline Title Affirmed Question Affirmed Notes  Chest Pain [1] Chest pain lasting <= 5 minutes AND [2] NO chest pain or cardiac symptoms now (Exceptions: pains lasting a few seconds)    Final Disposition User   See Physician within 24 Hours Atkins, RN, Jacquilin    Comments  Appointment made with PCP for 4/13 at 0945- stressed to caller to call back if anything changes or gets worse. NO SYMPTOMS IN THE LAST 2 DAYS   Referrals  REFERRED TO PCP OFFICE   Disagree/Comply: Comply

## 2016-08-05 NOTE — Telephone Encounter (Signed)
Appointment made through [facility]

## 2016-08-05 NOTE — Telephone Encounter (Signed)
FYI

## 2016-08-06 ENCOUNTER — Ambulatory Visit (INDEPENDENT_AMBULATORY_CARE_PROVIDER_SITE_OTHER): Payer: BLUE CROSS/BLUE SHIELD | Admitting: Family Medicine

## 2016-08-06 ENCOUNTER — Encounter: Payer: Self-pay | Admitting: Family Medicine

## 2016-08-06 VITALS — BP 121/80 | HR 79 | Temp 97.8°F | Resp 16 | Ht 65.0 in | Wt 162.0 lb

## 2016-08-06 DIAGNOSIS — R079 Chest pain, unspecified: Secondary | ICD-10-CM

## 2016-08-06 MED ORDER — CYCLOBENZAPRINE HCL 10 MG PO TABS: 10.0000 mg | ORAL_TABLET | Freq: Three times a day (TID) | ORAL | 0 refills | Status: DC | PRN

## 2016-08-06 NOTE — Patient Instructions (Signed)
Follow up as needed/scheduled We'll call you with your Cardiology appt Restart your acid reflux medication Use the muscle relaxer (cyclobenzaprine) at night and on weekends for muscle spasm- which could be contributing to your pain Call with any questions or concerns- particularly if symptoms change or worsen Hang in there!!!

## 2016-08-06 NOTE — Progress Notes (Signed)
Pre visit review using our clinic review tool, if applicable. No additional management support is needed unless otherwise documented below in the visit note. 

## 2016-08-06 NOTE — Assessment & Plan Note (Signed)
Recurrent problem for pt.  EKG WNL today and unchanged from previous.  Suspect her CP is combination of silent GERD and musculoskeletal etiology after moving tables 2 weeks ago.  She does have muscle spasm and TTP on exam.  Restart acid reflux med and start flexeril for muscle spasm.  Given her hx of DM, HTN, Hyperlipidemia will refer to Cards for complete evaluation and possible stress testing.  Reviewed supportive care and red flags that should prompt return.  Pt expressed understanding and is in agreement w/ plan.

## 2016-08-06 NOTE — Progress Notes (Signed)
   Subjective:    Patient ID: Dana Payne, female    DOB: May 09, 1965, 51 y.o.   MRN: [id number]  HPI CP- pt reports she will have pain radiating down L side of breast and across chest.  Will get a burning in L chest.  Some L arm and back pain.  sxs started 2 weeks ago and will occur intermittently.  sxs are not triggered by anything in particular- such as exercise or exertion.  Can occur at rest.  Last occurred last night.  sxs resolve spontaneously.  No SOB.  No diaphoresis or nausea.  Pt was moving tables at work which were heavy.   Review of Systems For ROS see HPI     Objective:   Physical Exam  Constitutional: She is oriented to person, place, and time. She appears well-developed and well-nourished. No distress.  HENT:  Head: Normocephalic and atraumatic.  Eyes: Conjunctivae and EOM are normal. Pupils are equal, round, and reactive to light.  Neck: Normal range of motion. Neck supple.  Cardiovascular: Normal rate, regular rhythm, normal heart sounds and intact distal pulses.   No murmur heard. Pulmonary/Chest: Effort normal and breath sounds normal. No respiratory distress.  Musculoskeletal: She exhibits tenderness (TTP over L trap and scapula w/ notable muscle spasm). She exhibits no edema.  Neurological: She is alert and oriented to person, place, and time.  Skin: Skin is warm and dry.  Psychiatric: She has a normal mood and affect. Her behavior is normal.  Vitals reviewed.         Assessment & Plan:

## 2016-08-11 ENCOUNTER — Ambulatory Visit (INDEPENDENT_AMBULATORY_CARE_PROVIDER_SITE_OTHER): Payer: BLUE CROSS/BLUE SHIELD | Admitting: Cardiovascular Disease

## 2016-08-11 ENCOUNTER — Ambulatory Visit: Payer: BLUE CROSS/BLUE SHIELD | Admitting: Cardiology

## 2016-08-11 ENCOUNTER — Encounter: Payer: Self-pay | Admitting: Cardiovascular Disease

## 2016-08-11 VITALS — BP 108/80 | HR 96 | Ht 65.0 in | Wt 160.1 lb

## 2016-08-11 DIAGNOSIS — R079 Chest pain, unspecified: Secondary | ICD-10-CM | POA: Diagnosis not present

## 2016-08-11 DIAGNOSIS — E119 Type 2 diabetes mellitus without complications: Secondary | ICD-10-CM | POA: Diagnosis not present

## 2016-08-11 NOTE — Patient Instructions (Signed)
Medication Instructions:  Your physician recommends that you continue on your current medications as directed. Please refer to the Current Medication list given to you today.   Labwork: None Ordered   Testing/Procedures: Your physician has requested that you have a lexiscan myoview. For further information please visit [email or web address]. Please follow instruction sheet, as given.    Follow-Up: Your physician recommends that you schedule a follow-up appointment in: 3 months with Dr. Nahser   If you need a refill on your cardiac medications before your next appointment, please call your pharmacy.   Thank you for choosing [facility]! Michelle Swinyer, RN [phone]    

## 2016-08-11 NOTE — Progress Notes (Signed)
Cardiology Office Note   Date:  08/11/2016   ID:  Dana Payne, DOB March 24, 1966, MRN [id number]  PCP:  Annye Asa, MD  Cardiologist:   Mertie Moores, MD   Chief Complaint  Patient presents with  . Chest Pain      History of Present Illness:  Dana Payne is a 51 y.o. female who is being seen today for the evaluation of sharp chest pain and  at the request of [name], MD.  Has been having these for 3 weeks. Sharp jabbing pains above left breast.  Can last for a minute or 2 ,  Will spontansously resolved.  Not worsened with exercise, deep breath, twising toros.  Not associated with eating or drinking . She walks regularly and does not have any CP with exercise  Has type 2 DM ( dx 4 years ago )  Last HbA1C was 11.    eats out quite often  .   Chol levels are well controlled.   Past Medical History:  Diagnosis Date  . Diabetes mellitus   . GERD (gastroesophageal reflux disease)   . HTN (hypertension)   . Hypercholesteremia   . Hypercholesterolemia   . Migraines   . Type II diabetes mellitus ([place])     Past Surgical History:  Procedure Laterality Date  . COLONOSCOPY  2012  . LASER ABLATION OF THE CERVIX     dysplasia  . PARTIAL HYSTERECTOMY       Current Outpatient Prescriptions  Medication Sig Dispense Refill  . aspirin EC 81 MG tablet Take 81 mg by mouth daily.    . cyclobenzaprine (FLEXERIL) 10 MG tablet Take 1 tablet (10 mg total) by mouth 3 (three) times daily as needed for muscle spasms. 30 tablet 0  . fluticasone (FLONASE) 50 MCG/ACT nasal spray Place 2 sprays into both nostrils daily. 16 g 6  . ibuprofen (ADVIL,MOTRIN) 600 MG tablet Take 1 tablet (600 mg total) by mouth every 6 (six) hours as needed. 30 tablet 0  . lisinopril-hydrochlorothiazide (PRINZIDE,ZESTORETIC) 20-12.5 MG tablet Take 1 tablet by mouth daily. 180 tablet 1  . metFORMIN (GLUCOPHAGE-XR) 500 MG 24 hr tablet TAKE TWO TABLETS BY MOUTH ONCE DAILY WITH  BREAKFAST 180  tablet 1  . ONE TOUCH ULTRA TEST test strip USE TO TEST BLOOD SUGAR TWICE DAILY 100 each 2  . simvastatin (ZOCOR) 40 MG tablet Take 1 tablet (40 mg total) by mouth at bedtime. 90 tablet 1  . sitaGLIPtin (JANUVIA) 100 MG tablet Take 1 tablet (100 mg total) by mouth daily. 30 tablet 6   No current facility-administered medications for this visit.     No flowsheet data found.    Allergies:   Patient has no known allergies.    Social History:  The patient  reports that she has never smoked. She has never used smokeless tobacco. She reports that she does not drink alcohol or use drugs.   Family History:  The patient's family history includes Colon cancer in her maternal aunt; Colon polyps in her maternal aunt and paternal uncle; Diabetes in her brother, father, mother, and sister; Heart disease in her father.    ROS:  Please see the history of present illness.    Review of Systems: Constitutional:  denies fever, chills, diaphoresis, appetite change and fatigue.  HEENT: denies photophobia, eye pain, redness, hearing loss, ear pain, congestion, sore throat, rhinorrhea, sneezing, neck pain, neck stiffness and tinnitus.  Respiratory: denies SOB, DOE, cough, chest tightness, and wheezing.  Cardiovascular:  admits to chest pain,    Gastrointestinal: denies nausea, vomiting, abdominal pain, diarrhea, constipation, blood in stool.  Genitourinary: denies dysuria, urgency, frequency, hematuria, flank pain and difficulty urinating.  Musculoskeletal: denies  myalgias, back pain, joint swelling, arthralgias and gait problem.   Skin: denies pallor, rash and wound.  Neurological: denies dizziness, seizures, syncope, weakness, light-headedness, numbness and headaches.   Hematological: denies adenopathy, easy bruising, personal or family bleeding history.  Psychiatric/ Behavioral: denies suicidal ideation, mood changes, confusion, nervousness, sleep disturbance and agitation.       All other systems  are reviewed and negative.    PHYSICAL EXAM: VS:  BP 108/80 (BP Location: Right Arm, Patient Position: Sitting, Cuff Size: Normal)   Pulse 96   Ht 5\' 5"  (1.651 m)   Wt 160 lb 1.9 oz (72.6 kg)   SpO2 98%   BMI 26.65 kg/m  , BMI Body mass index is 26.65 kg/m. GEN: Well nourished, well developed, in no acute distress  HEENT: normal  Neck: no JVD, carotid bruits, or masses Cardiac: RRR; no murmurs, rubs, or gallops,no edema  Respiratory:  clear to auscultation bilaterally, normal work of breathing GI: soft, nontender, nondistended, + BS MS: no deformity or atrophy  Skin: warm and dry, no rash Neuro:  Strength and sensation are intact Psych: normal   EKG:  EKG is not ordered today. The ekg ordered August 06, 2016:   demonstrates NSR at 69.   Poor Wave progression.  No ST or T wave changes   Recent Labs: 06/10/2016: ALT 24; BUN 10; Creatinine, Ser 0.69; Hemoglobin 14.5; Platelets 265.0; Potassium 4.3; Sodium 137; TSH 1.52    Lipid Panel    Component Value Date/Time   CHOL 151 06/10/2016 1204   TRIG 159.0 (H) 06/10/2016 1204   HDL 46.40 06/10/2016 1204   CHOLHDL 3 06/10/2016 1204   VLDL 31.8 06/10/2016 1204   LDLCALC 73 06/10/2016 1204      Wt Readings from Last 3 Encounters:  08/11/16 160 lb 1.9 oz (72.6 kg)  08/06/16 162 lb (73.5 kg)  07/01/16 165 lb (74.8 kg)      Other studies Reviewed: Additional studies/ records that were reviewed today include: . Review of the above records demonstrates:    ASSESSMENT AND PLAN:  1.  Atypical chest pain: Dana Payne presents today for further evaluation of some atypical episodes of chest pain. He is described as sharp jabbing pains last 1 or 2 minutes. There radiates through to her back. Because of increased shortness of breath. She denies any diaphoresis.  She has diabetes mellitus which has been very poorly controlled.  Because of her history of diabetes, think we should proceed with a Lexiscan Myoview study. I'll see her  again in 3 months for follow-up visit.  2. Diabetes mellitus:  Her hemoglobin A1c is greater than 11. She eats out very frequently. She does not exercise much through the winter.  I strongly advised her to work on her diabetes. She needs to exercise more and needs to eat much better. Otherwise she is at great risk for continued diabetic complications. She'll discuss these with her medical doctor.   Current medicines are reviewed at length with the patient today.  The patient does not have concerns regarding medicines.  Labs/ tests ordered today include:  No orders of the defined types were placed in this encounter.    Disposition:   FU with me in 3 months      Mertie Moores, MD  08/11/2016 3:47 PM  [facility] [address], [place], [place]  [address] Phone: [phone]; Fax: [phone]

## 2016-08-12 ENCOUNTER — Ambulatory Visit: Payer: BLUE CROSS/BLUE SHIELD | Admitting: Endocrinology

## 2016-08-17 ENCOUNTER — Telehealth (HOSPITAL_COMMUNITY): Payer: Self-pay | Admitting: *Deleted

## 2016-08-17 NOTE — Telephone Encounter (Signed)
Patient given detailed instructions per Myocardial Perfusion Study Information Sheet for the test on 08/19/16 at 0745. Patient notified to arrive 15 minutes early and that it is imperative to arrive on time for appointment to keep from having the test rescheduled.  If you need to cancel or reschedule your appointment, please call the office within 24 hours of your appointment. Failure to do so may result in a cancellation of your appointment, and a $50 no show fee. Patient verbalized understanding.Shivali Quackenbush, Ranae Palms

## 2016-08-19 ENCOUNTER — Ambulatory Visit (HOSPITAL_COMMUNITY): Payer: BLUE CROSS/BLUE SHIELD | Attending: Cardiovascular Disease

## 2016-08-19 DIAGNOSIS — R079 Chest pain, unspecified: Secondary | ICD-10-CM | POA: Diagnosis not present

## 2016-08-19 DIAGNOSIS — I10 Essential (primary) hypertension: Secondary | ICD-10-CM | POA: Diagnosis not present

## 2016-08-19 DIAGNOSIS — E119 Type 2 diabetes mellitus without complications: Secondary | ICD-10-CM | POA: Diagnosis not present

## 2016-08-19 DIAGNOSIS — E782 Mixed hyperlipidemia: Secondary | ICD-10-CM | POA: Diagnosis not present

## 2016-08-19 LAB — MYOCARDIAL PERFUSION IMAGING
LV dias vol: 61 mL (ref 46–106)
LV sys vol: 14 mL
Peak HR: 109 {beats}/min
RATE: 0.28
Rest HR: 75 {beats}/min
SDS: 3
SRS: 13
SSS: 16
TID: 1.11

## 2016-08-19 MED ORDER — TECHNETIUM TC 99M TETROFOSMIN IV KIT
31.8000 | PACK | Freq: Once | INTRAVENOUS | Status: AC | PRN
  Administered 2016-08-19: 31.8 via INTRAVENOUS
  Filled 2016-08-19: qty 32

## 2016-08-19 MED ORDER — TECHNETIUM TC 99M TETROFOSMIN IV KIT
10.9000 | PACK | Freq: Once | INTRAVENOUS | Status: AC | PRN
  Administered 2016-08-19: 10.9 via INTRAVENOUS
  Filled 2016-08-19: qty 11

## 2016-08-19 MED ORDER — REGADENOSON 0.4 MG/5ML IV SOLN
0.4000 mg | Freq: Once | INTRAVENOUS | Status: AC
  Administered 2016-08-19: 0.4 mg via INTRAVENOUS

## 2016-09-02 ENCOUNTER — Encounter: Payer: Self-pay | Admitting: Dietician

## 2016-09-02 ENCOUNTER — Encounter: Payer: BLUE CROSS/BLUE SHIELD | Attending: Family Medicine | Admitting: Dietician

## 2016-09-02 DIAGNOSIS — K219 Gastro-esophageal reflux disease without esophagitis: Secondary | ICD-10-CM | POA: Diagnosis not present

## 2016-09-02 DIAGNOSIS — Z6827 Body mass index (BMI) 27.0-27.9, adult: Secondary | ICD-10-CM | POA: Insufficient documentation

## 2016-09-02 DIAGNOSIS — E1142 Type 2 diabetes mellitus with diabetic polyneuropathy: Secondary | ICD-10-CM

## 2016-09-02 DIAGNOSIS — Z713 Dietary counseling and surveillance: Secondary | ICD-10-CM | POA: Insufficient documentation

## 2016-09-02 DIAGNOSIS — I10 Essential (primary) hypertension: Secondary | ICD-10-CM | POA: Insufficient documentation

## 2016-09-02 DIAGNOSIS — E1140 Type 2 diabetes mellitus with diabetic neuropathy, unspecified: Secondary | ICD-10-CM | POA: Diagnosis present

## 2016-09-02 DIAGNOSIS — E7800 Pure hypercholesterolemia, unspecified: Secondary | ICD-10-CM | POA: Diagnosis not present

## 2016-09-02 NOTE — Progress Notes (Signed)
Diabetes Self-Management Education  Visit Type: First/Initial  Appt. Start Time: 0915 Appt. End Time: 0865  09/02/2016  Ms. Dana Payne, identified by name and date of birth, is a 51 y.o. female with a diagnosis of Diabetes: Type 2. Other hx includes neuropathy, GERD, HTN, hypercholesterolemia. Medications include Januvia and Metformin.  She took her last Januvia a couple of days ago.  Patient informed that there are prescriptions for this at her pharmacy.  Patient lives with her fiance.  She works in [employer] at [employer] and [employer] works.  Long hours without lunch break at times.  She does the shopping and cooking and they often eat out. During stress, she increases her sweet intake.  This is especially true during the holidays when she bakes as well.  ASSESSMENT  Height 5\' 5"  (1.651 m), weight 163 lb (73.9 kg). Body mass index is 27.12 kg/m.      Diabetes Self-Management Education - 09/02/16 0927      Visit Information   Visit Type First/Initial     Initial Visit   Diabetes Type Type 2   Are you currently following a meal plan? No   Are you taking your medications as prescribed? Yes   Date Diagnosed 2014     Health Coping   How would you rate your overall health? Good     Psychosocial Assessment   Patient Belief/Attitude about Diabetes Motivated to manage diabetes   Self-care barriers None   Self-management support Doctor's office;Family   Other persons present Patient;Spouse/SO   Patient Concerns Nutrition/Meal planning;Glycemic Control   Special Needs None   Preferred Learning Style No preference indicated   Learning Readiness Ready   How often do you need to have someone help you when you read instructions, pamphlets, or other written materials from your doctor or pharmacy? 1 - Never   What is the last grade level you completed in school? 12th grade     Pre-Education Assessment   Patient understands the diabetes disease and treatment process. Needs Instruction   Patient understands incorporating nutritional management into lifestyle. Needs Instruction   Patient undertands incorporating physical activity into lifestyle. Needs Instruction   Patient understands using medications safely. Needs Instruction   Patient understands monitoring blood glucose, interpreting and using results Needs Instruction   Patient understands prevention, detection, and treatment of acute complications. Needs Instruction   Patient understands prevention, detection, and treatment of chronic complications. Needs Instruction   Patient understands how to develop strategies to address psychosocial issues. Needs Instruction   Patient understands how to develop strategies to promote health/change behavior. Needs Instruction     Complications   Last HgB A1C per patient/outside source 10.4 %  05/2016   How often do you check your blood sugar? 0 times/day (not testing)  For the past 2 weeks she has not checked due to time.  Prior to that she checks 2 times per day..   Fasting Blood glucose range (mg/dL) 70-129   Postprandial Blood glucose range (mg/dL) 130-179   Number of hypoglycemic episodes per month 0   Number of hyperglycemic episodes per week 7   Can you tell when your blood sugar is high? No   Have you had a dilated eye exam in the past 12 months? Yes   Have you had a dental exam in the past 12 months? No   Are you checking your feet? Yes   How many days per week are you checking your feet? 7     Dietary Intake  Breakfast salad or breakfast sandwich from subway or kids meal from chick fil-A or nabs or fruit or pasta and broccoli  8   Lunch pizza or salad or subway or soup and salad or occasional burger or hotdog  2-3   Snack (afternoon) candy or candy bar if she skips lunch   Dinner salad with baked chicken, shrimp, [place], or pork chop or ham or tuna OR meat sauce on zuchini noodles   Beverage(s) half and half tea, unsweetened tea, water, diet lemonade, diet gingerale,  coffee with 4 creamers and 2 tsp raw sugar     Exercise   Exercise Type Light (walking / raking leaves)   How many days per week to you exercise? 3   How many minutes per day do you exercise? 45   Total minutes per week of exercise 135     Patient Education   Previous Diabetes Education No   Disease state  Definition of diabetes, type 1 and 2, and the diagnosis of diabetes   Nutrition management  Role of diet in the treatment of diabetes and the relationship between the three main macronutrients and blood glucose level;Food label reading, portion sizes and measuring food.;Meal timing in regards to the patients' current diabetes medication.;Meal options for control of blood glucose level and chronic complications.;Information on hints to eating out and maintain blood glucose control.   Physical activity and exercise  Role of exercise on diabetes management, blood pressure control and cardiac health.   Medications Reviewed patients medication for diabetes, action, purpose, timing of dose and side effects.   Monitoring Identified appropriate SMBG and/or A1C goals.;Purpose and frequency of SMBG.;Daily foot exams;Yearly dilated eye exam   Acute complications Taught treatment of hypoglycemia - the 15 rule.   Psychosocial adjustment Worked with patient to identify barriers to care and solutions;Role of stress on diabetes   Personal strategies to promote health Lifestyle issues that need to be addressed for better diabetes care     Individualized Goals (developed by patient)   Nutrition General guidelines for healthy choices and portions discussed   Physical Activity Exercise 5-7 days per week;30 minutes per day   Medications take my medication as prescribed   Monitoring  test my blood glucose as discussed   Problem Solving meal schedule at work   Reducing Risk examine blood glucose patterns   Health Coping discuss diabetes with (comment)  MD/RD     Post-Education Assessment   Patient  understands the diabetes disease and treatment process. Demonstrates understanding / competency   Patient understands incorporating nutritional management into lifestyle. Demonstrates understanding / competency   Patient undertands incorporating physical activity into lifestyle. Demonstrates understanding / competency   Patient understands using medications safely. Demonstrates understanding / competency   Patient understands monitoring blood glucose, interpreting and using results Demonstrates understanding / competency   Patient understands prevention, detection, and treatment of acute complications. Demonstrates understanding / competency   Patient understands prevention, detection, and treatment of chronic complications. Demonstrates understanding / competency   Patient understands how to develop strategies to address psychosocial issues. Demonstrates understanding / competency   Patient understands how to develop strategies to promote health/change behavior. Demonstrates understanding / competency     Outcomes   Expected Outcomes Demonstrated interest in learning. Expect positive outcomes   Future DMSE PRN   Program Status Completed      Individualized Plan for Diabetes Self-Management Training:   Learning Objective:  Patient will have a greater understanding of diabetes self-management. Patient education  plan is to attend individual and/or group sessions per assessed needs and concerns.   Plan:   Patient Instructions  Rethink what you drink.  Aim for beverages without added carbohydrate. Be sure to eat regularly.  Have a healthy snack if you are not able to go to lunch. Be active most days. How can you better deal with stress?  Aim for 3 Carb Choices per meal (45 grams) +/- 1 either way  Aim for 0-1 Carbs per snack if hungry  Include protein in moderation with your meals and snacks Consider reading food labels for Total Carbohydrate and Fat Grams of foods Consider  increasing  your activity level by walking or biking for 30 minutes daily as tolerated Consider checking BG at alternate times per day as directed by MD  Continue taking medication  as directed by MD       Expected Outcomes:  Demonstrated interest in learning. Expect positive outcomes  Education material provided: Living Well with Diabetes, Food label handouts, A1C conversion sheet, Meal plan card, My Plate and Snack sheet, Dining out with Diabetes  If problems or questions, patient to contact team via:  Phone  Future DSME appointment: PRN

## 2016-09-02 NOTE — Patient Instructions (Signed)
Rethink what you drink.  Aim for beverages without added carbohydrate. Be sure to eat regularly.  Have a healthy snack if you are not able to go to lunch. Be active most days. How can you better deal with stress?  Aim for 3 Carb Choices per meal (45 grams) +/- 1 either way  Aim for 0-1 Carbs per snack if hungry  Include protein in moderation with your meals and snacks Consider reading food labels for Total Carbohydrate and Fat Grams of foods Consider  increasing your activity level by walking or biking for 30 minutes daily as tolerated Consider checking BG at alternate times per day as directed by MD  Continue taking medication  as directed by MD

## 2016-09-08 ENCOUNTER — Encounter: Payer: Self-pay | Admitting: Gynecology

## 2016-11-09 ENCOUNTER — Telehealth: Payer: Self-pay | Admitting: Family Medicine

## 2016-11-09 MED ORDER — SIMVASTATIN 40 MG PO TABS: 40.0000 mg | ORAL_TABLET | Freq: Every day | ORAL | 1 refills | Status: DC

## 2016-11-09 NOTE — Telephone Encounter (Signed)
Medication filled to pharmacy as requested.   

## 2016-11-09 NOTE — Telephone Encounter (Signed)
Pt states that she is getting ready to go out of town and will need a refill on simvastatin, pt did schedule a f/u appt for next week and states that pharmacy should be sending a request

## 2016-11-11 ENCOUNTER — Ambulatory Visit: Payer: BLUE CROSS/BLUE SHIELD | Admitting: Cardiovascular Disease

## 2016-11-16 ENCOUNTER — Encounter: Payer: Self-pay | Admitting: Family Medicine

## 2016-11-16 ENCOUNTER — Ambulatory Visit: Payer: BLUE CROSS/BLUE SHIELD | Admitting: Gynecology

## 2016-11-16 ENCOUNTER — Ambulatory Visit (INDEPENDENT_AMBULATORY_CARE_PROVIDER_SITE_OTHER): Payer: BLUE CROSS/BLUE SHIELD | Admitting: Family Medicine

## 2016-11-16 VITALS — BP 126/86 | HR 85 | Temp 98.0°F | Resp 16 | Ht 65.0 in | Wt 164.2 lb

## 2016-11-16 DIAGNOSIS — I10 Essential (primary) hypertension: Secondary | ICD-10-CM | POA: Diagnosis not present

## 2016-11-16 DIAGNOSIS — E1142 Type 2 diabetes mellitus with diabetic polyneuropathy: Secondary | ICD-10-CM

## 2016-11-16 DIAGNOSIS — E785 Hyperlipidemia, unspecified: Secondary | ICD-10-CM

## 2016-11-16 LAB — HEPATIC FUNCTION PANEL
ALT: 29 U/L (ref 0–35)
AST: 27 U/L (ref 0–37)
Albumin: 5 g/dL (ref 3.5–5.2)
Alkaline Phosphatase: 90 U/L (ref 39–117)
BILIRUBIN DIRECT: 0.1 mg/dL (ref 0.0–0.3)
BILIRUBIN TOTAL: 0.8 mg/dL (ref 0.2–1.2)
Total Protein: 7.3 g/dL (ref 6.0–8.3)

## 2016-11-16 LAB — HEMOGLOBIN A1C: Hgb A1c MFr Bld: 11.8 % — ABNORMAL HIGH (ref 4.6–6.5)

## 2016-11-16 LAB — CBC WITH DIFFERENTIAL/PLATELET
BASOS PCT: 0.7 % (ref 0.0–3.0)
Basophils Absolute: 0 10*3/uL (ref 0.0–0.1)
EOS ABS: 0.1 10*3/uL (ref 0.0–0.7)
EOS PCT: 1.6 % (ref 0.0–5.0)
HEMATOCRIT: 46.8 % — AB (ref 36.0–46.0)
HEMOGLOBIN: 15.7 g/dL — AB (ref 12.0–15.0)
LYMPHS PCT: 28.5 % (ref 12.0–46.0)
Lymphs Abs: 1.7 10*3/uL (ref 0.7–4.0)
MCHC: 33.6 g/dL (ref 30.0–36.0)
MCV: 86.5 fl (ref 78.0–100.0)
MONO ABS: 0.3 10*3/uL (ref 0.1–1.0)
Monocytes Relative: 5.6 % (ref 3.0–12.0)
Neutro Abs: 3.9 10*3/uL (ref 1.4–7.7)
Neutrophils Relative %: 63.6 % (ref 43.0–77.0)
Platelets: 257 10*3/uL (ref 150.0–400.0)
RBC: 5.41 Mil/uL — AB (ref 3.87–5.11)
RDW: 12.9 % (ref 11.5–15.5)
WBC: 6.1 10*3/uL (ref 4.0–10.5)

## 2016-11-16 LAB — BASIC METABOLIC PANEL
BUN: 9 mg/dL (ref 6–23)
CALCIUM: 10.4 mg/dL (ref 8.4–10.5)
CO2: 33 mEq/L — ABNORMAL HIGH (ref 19–32)
CREATININE: 0.76 mg/dL (ref 0.40–1.20)
Chloride: 96 mEq/L (ref 96–112)
GFR: 102.78 mL/min (ref >60.00)
Glucose, Bld: 309 mg/dL — ABNORMAL HIGH (ref 70–99)
Potassium: 4.7 mEq/L (ref 3.5–5.1)
Sodium: 137 mEq/L (ref 135–145)

## 2016-11-16 LAB — LIPID PANEL
CHOL/HDL RATIO: 4
Cholesterol: 153 mg/dL (ref 0–200)
HDL: 41.6 mg/dL (ref >39.00)
NonHDL: 111.65
Triglycerides: 211 mg/dL — ABNORMAL HIGH (ref 0.0–149.0)
VLDL: 42.2 mg/dL — AB (ref 0.0–40.0)

## 2016-11-16 LAB — LDL CHOLESTEROL, DIRECT: LDL DIRECT: 85 mg/dL

## 2016-11-16 LAB — TSH: TSH: 0.99 u[IU]/mL (ref 0.35–4.50)

## 2016-11-16 NOTE — Progress Notes (Signed)
Pre visit review using our clinic review tool, if applicable. No additional management support is needed unless otherwise documented below in the visit note. 

## 2016-11-16 NOTE — Progress Notes (Signed)
   Subjective:    Patient ID: Dana Payne, female    DOB: 1965/10/13, 51 y.o.   MRN: [id number]  HPI DM- chronic problem, A1C is very poorly controlled (10.4).  On Metformin XR 1000mg  and Januvia 100mg  daily.  UTD on eye exam, due for foot exam.  On ACE for renal protection.  Pt reports she has been exercising- walking regularly.  Denies symptomatic lows.  No weakness/numbness of hands/feet.  HTN- chronic problem, on Lisinopril HCTZ daily w/ adequate control.  No CP, SOB, HAs, visual changes, edema.  Hyperlipidemia- chronic problem, on Simvastatin 40mg  daily.  Denies abd pain, N/V.   Review of Systems For ROS see HPI     Objective:   Physical Exam  Constitutional: She is oriented to person, place, and time. She appears well-developed and well-nourished. No distress.  HENT:  Head: Normocephalic and atraumatic.  Eyes: Pupils are equal, round, and reactive to light. Conjunctivae and EOM are normal.  Neck: Normal range of motion. Neck supple. No thyromegaly present.  Cardiovascular: Normal rate, regular rhythm, normal heart sounds and intact distal pulses.   No murmur heard. Pulmonary/Chest: Effort normal and breath sounds normal. No respiratory distress.  Abdominal: Soft. She exhibits no distension. There is no tenderness.  Musculoskeletal: She exhibits no edema.  Lymphadenopathy:    She has no cervical adenopathy.  Neurological: She is alert and oriented to person, place, and time.  Skin: Skin is warm and dry.  Psychiatric: She has a normal mood and affect. Her behavior is normal.  Vitals reviewed.         Assessment & Plan:

## 2016-11-16 NOTE — Assessment & Plan Note (Signed)
Chronic problem.  Pt has hx of noncompliance.  Is now taking her Januvia- which is better than where we were.  UTD on eye exam.  On ACE for renal protection.  Foot exam done today.  Pt did see Endo but did not go back for f/u.  Check labs.  Adjust meds prn

## 2016-11-16 NOTE — Assessment & Plan Note (Signed)
Chronic problem.  Adequate control today.  Asymptomatic.  Check labs.  No anticipated med changes. 

## 2016-11-16 NOTE — Patient Instructions (Signed)
Schedule your complete physical in 3-4 months We'll notify you of your lab results and make any changes if needed Continue to work on healthy diet and regular exercise- you're doing great! Call with any questions or concerns Happy Birthday!!!

## 2016-11-16 NOTE — Assessment & Plan Note (Signed)
Chronic problem.  Tolerating statin w/o difficulty.  Stressed need for healthy diet and regular exercise.  Check labs.  Adjust meds prn  

## 2016-11-17 ENCOUNTER — Ambulatory Visit: Payer: BLUE CROSS/BLUE SHIELD | Admitting: Gynecology

## 2016-11-26 ENCOUNTER — Ambulatory Visit: Payer: BLUE CROSS/BLUE SHIELD | Admitting: Gastroenterology

## 2017-01-31 ENCOUNTER — Other Ambulatory Visit: Payer: Self-pay | Admitting: Family Medicine

## 2017-02-18 ENCOUNTER — Encounter: Payer: Self-pay | Admitting: Family Medicine

## 2017-02-18 ENCOUNTER — Ambulatory Visit (INDEPENDENT_AMBULATORY_CARE_PROVIDER_SITE_OTHER): Payer: BLUE CROSS/BLUE SHIELD | Admitting: Family Medicine

## 2017-02-18 VITALS — BP 118/84 | HR 86 | Temp 98.2°F | Resp 16 | Ht 65.0 in | Wt 163.0 lb

## 2017-02-18 DIAGNOSIS — E1142 Type 2 diabetes mellitus with diabetic polyneuropathy: Secondary | ICD-10-CM | POA: Diagnosis not present

## 2017-02-18 LAB — BASIC METABOLIC PANEL
BUN: 10 mg/dL (ref 6–23)
CALCIUM: 10.5 mg/dL (ref 8.4–10.5)
CO2: 32 meq/L (ref 19–32)
Chloride: 99 mEq/L (ref 96–112)
Creatinine, Ser: 0.67 mg/dL (ref 0.40–1.20)
GFR: 118.75 mL/min (ref >60.00)
GLUCOSE: 192 mg/dL — AB (ref 70–99)
POTASSIUM: 4.6 meq/L (ref 3.5–5.1)
SODIUM: 138 meq/L (ref 135–145)

## 2017-02-18 LAB — HEMOGLOBIN A1C: Hgb A1c MFr Bld: 9.9 % — ABNORMAL HIGH (ref 4.6–6.5)

## 2017-02-18 NOTE — Progress Notes (Signed)
   Subjective:    Patient ID: Dana Payne, female    DOB: 1965/09/01, 51 y.o.   MRN: [id number]  HPI DM- chronic problem.  Pt has hx of noncompliance w/ treatment recommendations, referrals, and medications.  UTD on foot exam.  On ACE for renal protection.  Due for eye exam in December.  Currently on Metformin XR 500mg  2 tabs daily and Januvia.  Pt reports home CBGs 'typically run 1 something.  On average 140'.  Pt reports increased veggie intake.  No CP, SOB, HAs, visual changes, edema.  No changes in neuropathy.  Only 1 symptomatic low.  No abd pain, N/V.   Review of Systems For ROS see HPI     Objective:   Physical Exam  Constitutional: She is oriented to person, place, and time. She appears well-developed and well-nourished. No distress.  HENT:  Head: Normocephalic and atraumatic.  Eyes: Pupils are equal, round, and reactive to light. Conjunctivae and EOM are normal.  Neck: Normal range of motion. Neck supple. Thyromegaly present.  Cardiovascular: Normal rate, regular rhythm, normal heart sounds and intact distal pulses.   No murmur heard. Pulmonary/Chest: Effort normal and breath sounds normal. No respiratory distress.  Abdominal: Soft. She exhibits no distension. There is no tenderness.  Musculoskeletal: She exhibits no edema.  Lymphadenopathy:    She has no cervical adenopathy.  Neurological: She is alert and oriented to person, place, and time.  Skin: Skin is warm and dry.  Psychiatric: She has a normal mood and affect. Her behavior is normal.  Vitals reviewed.         Assessment & Plan:

## 2017-02-18 NOTE — Patient Instructions (Signed)
Schedule your complete physical in 3-4 months We'll notify you of your lab results and make any changes if needed Continue to work on healthy diet and regular exercise- you can do it! Call with any questions or concerns Happy Fall!!! 

## 2017-02-18 NOTE — Assessment & Plan Note (Signed)
Chronic problem.  Pt has hx of noncompliance w/ tx plans, meds, and follow ups.  She has made considerable changes by her report to her diet and med compliance- I applauded her efforts.  UTD on foot exam, eye exam.  Check labs.  Adjust meds prn

## 2017-03-23 ENCOUNTER — Other Ambulatory Visit: Payer: Self-pay

## 2017-03-23 ENCOUNTER — Ambulatory Visit: Payer: BLUE CROSS/BLUE SHIELD | Admitting: Gastroenterology

## 2017-03-23 ENCOUNTER — Encounter: Payer: Self-pay | Admitting: Gastroenterology

## 2017-03-23 DIAGNOSIS — K625 Hemorrhage of anus and rectum: Secondary | ICD-10-CM

## 2017-03-23 MED ORDER — PEG 3350-KCL-NA BICARB-NACL 420 G PO SOLR: 4000.0000 mL | ORAL | 0 refills | Status: DC

## 2017-03-23 NOTE — Patient Instructions (Addendum)
DEC 4: TAKE LINZESS 145 MCG DAILY.  DEC 6: START CLEAR LIQUID DIET WITH BREAKFAST. YOU CAN HAVE ON SERVING OF SCRAMBLED EGGS OR MASHED POTATOES IF YOU GET HUNGRY.  DEC 6: START PREP  DEC 7: COMPLETE COLONOSCOPY DEC 7.  PLEASE CALL WITH QUESTIONS OR CONCERNS.   RETURN TO WORK NOV 29.  NEXT VISIT WILL BE SCHEDULED IF NEEDED AFTER ENDOSCOPY.

## 2017-03-23 NOTE — Progress Notes (Addendum)
PRIMARY GI DOCTOR: DR. Gala Romney. PT INADVERTENTLY SCHEDULED IN MY CLINIC. WANTS DR. Gala Romney FOR TCS.  Subjective:    Patient ID: Dana Payne, female    DOB: [birth year], 51 y.o.   MRN: [id number] [name], MD  HPI TRYING TO GET A TCS. NOT HAVING RECTAL BLEEDING AT THIS POINT. JOB STRESSES HER. BMs: TWICE A WEEK. NORMALLY TAKES A SOFTENER TO GO. IF DOESN'T TAKE ANYTHING SHE CAN GO 2 WEEKS W/O A BM. BEEN HAVING HAs FOR PAST FEW WEEKS. OCCASIONALLY YOU LEFT LOWER QUADRANT PAIN. GETS LEFT ARM AND BREAST PAIN. Shipment fell on left foot and now has left foot pain/swelling. TRYING to get into see her PCP.  REQUESTS A NOTE FOR WORK.   PT DENIES FEVER, CHILLS, HEMATOCHEZIA, HEMATEMESIS, nausea, vomiting, melena, diarrhea, CHEST PAIN, SHORTNESS OF BREATH, CHANGE IN BOWEL IN HABITS, problems swallowing, problems with sedation, OR heartburn or indigestion.  Past Medical History:  Diagnosis Date  . Diabetes mellitus   . GERD (gastroesophageal reflux disease)   . HTN (hypertension)   . Hypercholesteremia   . Hypercholesterolemia   . Migraines   . Type II diabetes mellitus ([place])    Past Surgical History:  Procedure Laterality Date  . COLONOSCOPY  2012  . LASER ABLATION OF THE CERVIX     dysplasia  . PARTIAL HYSTERECTOMY     No Known Allergies  Current Outpatient Medications  Medication Sig Dispense Refill  . acetaminophen (TYLENOL) 500 MG tablet Take 500 mg by mouth every 6 (six) hours as needed.    Marland Kitchen aspirin EC 81 MG tablet Take 81 mg by mouth daily.    . cyclobenzaprine (FLEXERIL) 10 MG tablet Take 1 tablet (10 mg total) by mouth 3 (three) times daily as needed for muscle spasms.    . fluticasone (FLONASE) 50 MCG/ACT nasal spray Place 2 sprays into both nostrils daily. (Patient taking differently: Place 2 sprays into both nostrils as needed. )    . lisinopril-hydrochlorothiazide (PRINZIDE,ZESTORETIC) 20-12.5 MG tablet Take 1 tablet by mouth daily.    . metFORMIN (GLUCOPHAGE-XR)  500 MG 24 hr tablet TAKE TWO TABLETS BY MOUTH ONCE DAILY WITH BREAKFAST    . ONE TOUCH ULTRA TEST test strip USE TO TEST BLOOD SUGAR TWICE DAILY    . SIMVASTATIN DAILY    . sitaGLIPtin (JANUVIA) 100 MG tablet Take 1 tablet (100 mg total) by mouth daily.    .       Family History  Problem Relation Age of Onset  . Colon polyps Maternal Aunt   . Colon polyps Paternal Uncle   . Colon cancer Maternal Aunt        in 46s when diagnosed.   Marland Kitchen Heart disease Father   . Diabetes Father   . Diabetes Mother   . Diabetes Sister   . Diabetes Brother    Social History   Socioeconomic History  . Marital status: Single    Spouse name: None  . Number of children: None  . Years of education: None  . Highest education level: None  Social Needs  . Financial resource strain: None  . Food insecurity - worry: None  . Food insecurity - inability: None  . Transportation needs - medical: None  . Transportation needs - non-medical: None  Occupational History  . None  Tobacco Use  . Smoking status: Never Smoker  . Smokeless tobacco: Never Used  Substance and Sexual Activity  . Alcohol use: No    Alcohol/week: 0.0 oz  . Drug  use: No  . Sexual activity: Yes    Birth control/protection: None    Comment: HYSTERECTOMY  Other Topics Concern  . 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   Review of Systems PER HPI OTHERWISE ALL SYSTEMS ARE NEGATIVE.    Objective:   Physical Exam  Constitutional: She is oriented to person, place, and time. She appears well-developed and well-nourished. No distress.  HENT:  Head: Normocephalic and atraumatic.  Mouth/Throat: Oropharynx is clear and moist. No oropharyngeal exudate.  Eyes: Pupils are equal, round, and reactive to light. No scleral icterus.  Neck: Normal range of motion. Neck supple.  Cardiovascular: Normal rate, regular rhythm and normal heart sounds.  Pulmonary/Chest: Effort normal and  breath sounds normal. No respiratory distress.  Abdominal: Soft. Bowel sounds are normal. She exhibits no distension. There is no tenderness.  Musculoskeletal: She exhibits no edema.  Lymphadenopathy:    She has no cervical adenopathy.  Neurological: She is alert and oriented to person, place, and time.  NO FOCAL DEFICITS  Psychiatric:  SLIGHTLY ANXIOUS MOOD, NL AFFECT  Vitals reviewed.     Assessment & Plan:

## 2017-03-23 NOTE — Progress Notes (Signed)
CC'ED TO PCP 

## 2017-03-23 NOTE — Assessment & Plan Note (Addendum)
ADDENDUM: 1053: TCS WILL BE SCHEDULE WITH DR. Gala Romney.  INTERMITTENT AND PT HAS FAMILY HISTORY OF COLON CANCER/POLYPS.  DEC 4: TAKE LINZESS 145 MCG DAILY. DEC 6: START CLEAR LIQUID DIET WITH BREAKFAST. YOU CAN HAVE ON SERVING OF SCRAMBLED EGGS OR MASHED POTATOES IF YOU GET HUNGRY. DEC 6: START PREP DEC 7: COMPLETE COLONOSCOPY DEC 7. DISCUSSED PROCEDURE, BENEFITS, & RISKS: < 1% chance of medication reaction, bleeding, perforation, or rupture of spleen/liver.  CALL WITH QUESTIONS OR CONCERNS. RETURN TO WORK NOV 29.  NEXT VISIT WILL BE SCHEDULED IF NEEDED AFTER ENDOSCOPY.

## 2017-03-23 NOTE — H&P (View-Only) (Signed)
PRIMARY GI DOCTOR: DR. Gala Romney. PT INADVERTENTLY SCHEDULED IN MY CLINIC. WANTS DR. Gala Romney FOR TCS.  Subjective:    Patient ID: Dana Payne, female    DOB: 01/01/1966, 51 y.o.   MRN: [id number] Dana Minium, MD  HPI TRYING TO GET A TCS. NOT HAVING RECTAL BLEEDING AT THIS POINT. JOB STRESSES HER. BMs: TWICE A WEEK. NORMALLY TAKES A SOFTENER TO GO. IF DOESN'T TAKE ANYTHING SHE CAN GO 2 WEEKS W/O A BM. BEEN HAVING HAs FOR PAST FEW WEEKS. OCCASIONALLY YOU LEFT LOWER QUADRANT PAIN. GETS LEFT ARM AND BREAST PAIN. Shipment fell on left foot and now has left foot pain/swelling. TRYING to get into see her PCP.  REQUESTS A NOTE FOR WORK.   PT DENIES FEVER, CHILLS, HEMATOCHEZIA, HEMATEMESIS, nausea, vomiting, melena, diarrhea, CHEST PAIN, SHORTNESS OF BREATH, CHANGE IN BOWEL IN HABITS, problems swallowing, problems with sedation, OR heartburn or indigestion.  Past Medical History:  Diagnosis Date  . Diabetes mellitus   . GERD (gastroesophageal reflux disease)   . HTN (hypertension)   . Hypercholesteremia   . Hypercholesterolemia   . Migraines   . Type II diabetes mellitus ([place])    Past Surgical History:  Procedure Laterality Date  . COLONOSCOPY  2012  . LASER ABLATION OF THE CERVIX     dysplasia  . PARTIAL HYSTERECTOMY     No Known Allergies  Current Outpatient Medications  Medication Sig Dispense Refill  . acetaminophen (TYLENOL) 500 MG tablet Take 500 mg by mouth every 6 (six) hours as needed.    Marland Kitchen aspirin EC 81 MG tablet Take 81 mg by mouth daily.    . cyclobenzaprine (FLEXERIL) 10 MG tablet Take 1 tablet (10 mg total) by mouth 3 (three) times daily as needed for muscle spasms.    . fluticasone (FLONASE) 50 MCG/ACT nasal spray Place 2 sprays into both nostrils daily. (Patient taking differently: Place 2 sprays into both nostrils as needed. )    . lisinopril-hydrochlorothiazide (PRINZIDE,ZESTORETIC) 20-12.5 MG tablet Take 1 tablet by mouth daily.    . metFORMIN (GLUCOPHAGE-XR)  500 MG 24 hr tablet TAKE TWO TABLETS BY MOUTH ONCE DAILY WITH BREAKFAST    . ONE TOUCH ULTRA TEST test strip USE TO TEST BLOOD SUGAR TWICE DAILY    . SIMVASTATIN DAILY    . sitaGLIPtin (JANUVIA) 100 MG tablet Take 1 tablet (100 mg total) by mouth daily.    .       Family History  Problem Relation Age of Onset  . Colon polyps Maternal Aunt   . Colon polyps Paternal Uncle   . Colon cancer Maternal Aunt        in 52s when diagnosed.   Marland Kitchen Heart disease Father   . Diabetes Father   . Diabetes Mother   . Diabetes Sister   . Diabetes Brother    Social History   Socioeconomic History  . Marital status: Single    Spouse name: None  . Number of children: None  . Years of education: None  . Highest education level: None  Social Needs  . Financial resource strain: None  . Food insecurity - worry: None  . Food insecurity - inability: None  . Transportation needs - medical: None  . Transportation needs - non-medical: None  Occupational History  . None  Tobacco Use  . Smoking status: Never Smoker  . Smokeless tobacco: Never Used  Substance and Sexual Activity  . Alcohol use: No    Alcohol/week: 0.0 oz  . Drug  use: No  . Sexual activity: Yes    Birth control/protection: None    Comment: HYSTERECTOMY  Other Topics Concern  . 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   Review of Systems PER HPI OTHERWISE ALL SYSTEMS ARE NEGATIVE.    Objective:   Physical Exam  Constitutional: She is oriented to person, place, and time. She appears well-developed and well-nourished. No distress.  HENT:  Head: Normocephalic and atraumatic.  Mouth/Throat: Oropharynx is clear and moist. No oropharyngeal exudate.  Eyes: Pupils are equal, round, and reactive to light. No scleral icterus.  Neck: Normal range of motion. Neck supple.  Cardiovascular: Normal rate, regular rhythm and normal heart sounds.  Pulmonary/Chest: Effort normal and  breath sounds normal. No respiratory distress.  Abdominal: Soft. Bowel sounds are normal. She exhibits no distension. There is no tenderness.  Musculoskeletal: She exhibits no edema.  Lymphadenopathy:    She has no cervical adenopathy.  Neurological: She is alert and oriented to person, place, and time.  NO FOCAL DEFICITS  Psychiatric:  SLIGHTLY ANXIOUS MOOD, NL AFFECT  Vitals reviewed.     Assessment & Plan:

## 2017-03-25 ENCOUNTER — Other Ambulatory Visit: Payer: Self-pay

## 2017-03-25 DIAGNOSIS — Z800 Family history of malignant neoplasm of digestive organs: Secondary | ICD-10-CM

## 2017-03-25 DIAGNOSIS — K625 Hemorrhage of anus and rectum: Secondary | ICD-10-CM

## 2017-03-28 NOTE — Progress Notes (Signed)
Colonoscopy w/ Dr. Gala Romney scheduled for 04/21/17 (as requested by the pt). Dr. Gala Romney advised for pt to not take Januvia the morning of procedure. No Metformin the morning of the procedure and none the night before the procedure. Diabetic medication adjustments noted on procedure instructions and mailed to pt.

## 2017-04-21 ENCOUNTER — Encounter (HOSPITAL_COMMUNITY): Payer: Self-pay

## 2017-04-21 ENCOUNTER — Encounter (HOSPITAL_COMMUNITY)
Admission: RE | Disposition: A | Discharge status: Home / Self Care | Payer: Self-pay | Source: Ambulatory Visit | Attending: Internal Medicine

## 2017-04-21 ENCOUNTER — Ambulatory Visit (HOSPITAL_COMMUNITY)
Admission: RE | Admit: 2017-04-21 | Discharge: 2017-04-21 | Disposition: A | Discharge status: Home / Self Care | Payer: BLUE CROSS/BLUE SHIELD | Source: Ambulatory Visit | Attending: Internal Medicine | Admitting: Internal Medicine

## 2017-04-21 ENCOUNTER — Other Ambulatory Visit: Payer: Self-pay

## 2017-04-21 DIAGNOSIS — I10 Essential (primary) hypertension: Secondary | ICD-10-CM | POA: Insufficient documentation

## 2017-04-21 DIAGNOSIS — Z79899 Other long term (current) drug therapy: Secondary | ICD-10-CM | POA: Diagnosis not present

## 2017-04-21 DIAGNOSIS — E7800 Pure hypercholesterolemia, unspecified: Secondary | ICD-10-CM | POA: Diagnosis not present

## 2017-04-21 DIAGNOSIS — Z800 Family history of malignant neoplasm of digestive organs: Secondary | ICD-10-CM | POA: Insufficient documentation

## 2017-04-21 DIAGNOSIS — Z7984 Long term (current) use of oral hypoglycemic drugs: Secondary | ICD-10-CM | POA: Insufficient documentation

## 2017-04-21 DIAGNOSIS — K921 Melena: Secondary | ICD-10-CM | POA: Diagnosis present

## 2017-04-21 DIAGNOSIS — E119 Type 2 diabetes mellitus without complications: Secondary | ICD-10-CM | POA: Insufficient documentation

## 2017-04-21 DIAGNOSIS — K642 Third degree hemorrhoids: Secondary | ICD-10-CM | POA: Diagnosis not present

## 2017-04-21 DIAGNOSIS — Z7951 Long term (current) use of inhaled steroids: Secondary | ICD-10-CM | POA: Diagnosis not present

## 2017-04-21 DIAGNOSIS — Z7982 Long term (current) use of aspirin: Secondary | ICD-10-CM | POA: Insufficient documentation

## 2017-04-21 DIAGNOSIS — K644 Residual hemorrhoidal skin tags: Secondary | ICD-10-CM | POA: Diagnosis not present

## 2017-04-21 DIAGNOSIS — K625 Hemorrhage of anus and rectum: Secondary | ICD-10-CM

## 2017-04-21 HISTORY — PX: COLONOSCOPY: SHX5424

## 2017-04-21 LAB — GLUCOSE, CAPILLARY: GLUCOSE-CAPILLARY: 136 mg/dL — AB (ref 65–99)

## 2017-04-21 SURGERY — COLONOSCOPY
Anesthesia: Moderate Sedation

## 2017-04-21 MED ORDER — ONDANSETRON HCL 4 MG/2ML IJ SOLN
INTRAMUSCULAR | Status: AC
  Filled 2017-04-21: qty 2

## 2017-04-21 MED ORDER — SODIUM CHLORIDE 0.9 % IV SOLN
INTRAVENOUS | Status: DC
  Administered 2017-04-21: 14:00:00 via INTRAVENOUS

## 2017-04-21 MED ORDER — MIDAZOLAM HCL 5 MG/5ML IJ SOLN
INTRAMUSCULAR | Status: AC
  Filled 2017-04-21: qty 10

## 2017-04-21 MED ORDER — MEPERIDINE HCL 100 MG/ML IJ SOLN
INTRAMUSCULAR | Status: AC
  Filled 2017-04-21: qty 2

## 2017-04-21 MED ORDER — MEPERIDINE HCL 100 MG/ML IJ SOLN
INTRAMUSCULAR | Status: DC | PRN
  Administered 2017-04-21: 25 mg via INTRAVENOUS
  Administered 2017-04-21: 50 mg via INTRAVENOUS

## 2017-04-21 MED ORDER — ONDANSETRON HCL 4 MG/2ML IJ SOLN
INTRAMUSCULAR | Status: DC | PRN
  Administered 2017-04-21: 4 mg via INTRAVENOUS

## 2017-04-21 MED ORDER — MIDAZOLAM HCL 5 MG/5ML IJ SOLN
INTRAMUSCULAR | Status: DC | PRN
  Administered 2017-04-21: 1 mg via INTRAVENOUS
  Administered 2017-04-21: 2 mg via INTRAVENOUS

## 2017-04-21 MED ORDER — STERILE WATER FOR IRRIGATION IR SOLN
Status: DC | PRN
  Administered 2017-04-21: 2.5 mL

## 2017-04-21 NOTE — Op Note (Signed)
[facility] Patient Name: Dana Payne Procedure Date: 04/21/2017 1:58 PM MRN: [id number] Date of Birth: Mar 21, 1966 Attending MD: Norvel Richards , MD CSN: [id number] Age: 51 Admit Type: Outpatient Procedure:                Colonoscopy Indications:              Hematochezia Providers:                Norvel Richards, MD, Lurline Del, RN, Tammy                            Vaught, RN, Nelma Rothman, Technician Referring MD:             Aundra Millet. Birdie Riddle, MD Medicines:                Midazolam 3 mg IV, Meperidine 75 mg IV Complications:            No immediate complications. Estimated Blood Loss:     Estimated blood loss: none. Procedure:                Pre-Anesthesia Assessment:                           - Prior to the procedure, a History and Physical                            was performed, and patient medications and                            allergies were reviewed. The patient's tolerance of                            previous anesthesia was also reviewed. The risks                            and benefits of the procedure and the sedation                            options and risks were discussed with the patient.                            All questions were answered, and informed consent                            was obtained. Prior Anticoagulants: The patient has                            taken no previous anticoagulant or antiplatelet                            agents. ASA Grade Assessment: II - A patient with                            mild systemic disease. After reviewing the risks  and benefits, the patient was deemed in                            satisfactory condition to undergo the procedure.                           After obtaining informed consent, the colonoscope                            was passed under direct vision. Throughout the                            procedure, the patient's blood pressure, pulse, and                    oxygen saturations were monitored continuously. The                            [id number]-[id number]) scope was introduced through                            the anus and advanced to the the cecum, identified                            by appendiceal orifice and ileocecal valve. The                            terminal ileum, ileocecal valve, appendiceal                            orifice, and rectum and the ileocecal valve,                            appendiceal orifice, and rectum were photographed.                            The ileocecal valve, appendiceal orifice, and                            rectum were photographed. The colonoscopy was                            performed without difficulty. The patient tolerated                            the procedure well. The quality of the bowel                            preparation was adequate. The entire colon was well                            visualized. Scope In: 2:20:46 PM Scope Out: 2:35:10 PM Scope Withdrawal Time: 0 hours 8 minutes 15 seconds  Total Procedure Duration: 0 hours 14 minutes 24 seconds  Findings:      The perianal  and digital rectal examinations were normal.      The entire examined colon appeared normal.      External internal hemorrhoids were found during retroflexion. The       hemorrhoids were Grade III (internal hemorrhoids that prolapse but       require manual reduction). Impression:               - The entire examined colon is normal.                           - External internal hemorrhoids - Likely source of                            hematochezia                           - No specimens collected. Moderate Sedation:      Moderate (conscious) sedation was personally administered by an       anesthesia professional. The following parameters were monitored: oxygen       saturation, heart rate, blood pressure, respiratory rate, EKG, adequacy       of pulmonary ventilation, and response to care.  Total physician       intraservice time was 21 minutes. Recommendation:           - Repeat colonoscopy in 10 years for screening                            purposes.                           - Return to GI office as needed. Begin Benefiber 1                            tablespoon daily for one month then increase to 2                            tablespoons daily thereafter. Patient may be a                            reasonably goodd hemorrhoid banding candidate if                            needed. patient is to call if she has any recurrent                            bleeding.                           - Advance diet as tolerated. Procedure Code(s):        --- Professional ---                           [phone], Colonoscopy, flexible; diagnostic, including                            collection of specimen(s) by brushing or washing,  when performed (separate procedure) Diagnosis Code(s):        --- Professional ---                           K64.2, Third degree hemorrhoids                           K64.4, Residual hemorrhoidal skin tags                           K92.1, Melena (includes Hematochezia) CPT copyright 2016 American Medical Association. All rights reserved. The codes documented in this report are preliminary and upon coder review may  be revised to meet current compliance requirements. Dana Estimable. Rourk, MD Norvel Richards, MD 04/21/2017 2:50:35 PM This report has been signed electronically. Number of Addenda: 0

## 2017-04-21 NOTE — Interval H&P Note (Signed)
History and Physical Interval Note:  04/21/2017 2:06 PM  Dana Payne  has presented today for surgery, with the diagnosis of family history colon cancer, rectal bleeding  The various methods of treatment have been discussed with the patient and family. After consideration of risks, benefits and other options for treatment, the patient has consented to  Procedure(s) with comments: COLONOSCOPY (N/A) - 2:15pm as a surgical intervention .  The patient's history has been reviewed, patient examined, no change in status, stable for surgery.  I have reviewed the patient's chart and labs.  Questions were answered to the patient's satisfaction.     Dana Payne  Rectal bleeding has resolved. Negative colonoscopy except for hemorrhoids 2012.  Diagnostic colonoscopy today per plan.  The risks, benefits, limitations, alternatives and imponderables have been reviewed with the patient. Questions have been answered. All parties are agreeable.

## 2017-04-21 NOTE — CV Procedure (Signed)
  Colonoscopy Discharge Instructions  Read the instructions outlined below and refer to this sheet in the next few weeks. These discharge instructions provide you with general information on caring for yourself after you leave the hospital. Your doctor may also give you specific instructions. While your treatment has been planned according to the most current medical practices available, unavoidable complications occasionally occur. If you have any problems or questions after discharge, call Dr. Gala Romney at [phone]. ACTIVITY  You may resume your regular activity, but move at a slower pace for the next 24 hours.   Take frequent rest periods for the next 24 hours.   Walking will help get rid of the air and reduce the bloated feeling in your belly (abdomen).   No driving for 24 hours (because of the medicine (anesthesia) used during the test).    Do not sign any important legal documents or operate any machinery for 24 hours (because of the anesthesia used during the test).  NUTRITION  Drink plenty of fluids.   You may resume your normal diet as instructed by your doctor.   Begin with a light meal and progress to your normal diet. Heavy or fried foods are harder to digest and may make you feel sick to your stomach (nauseated).   Avoid alcoholic beverages for 24 hours or as instructed.  MEDICATIONS  You may resume your normal medications unless your doctor tells you otherwise.  WHAT YOU CAN EXPECT TODAY  Some feelings of bloating in the abdomen.   Passage of more gas than usual.   Spotting of blood in your stool or on the toilet paper.  IF YOU HAD POLYPS REMOVED DURING THE COLONOSCOPY:  No aspirin products for 7 days or as instructed.   No alcohol for 7 days or as instructed.   Eat a soft diet for the next 24 hours.  FINDING OUT THE RESULTS OF YOUR TEST Not all test results are available during your visit. If your test results are not back during the visit, make an appointment  with your caregiver to find out the results. Do not assume everything is normal if you have not heard from your caregiver or the medical facility. It is important for you to follow up on all of your test results.  SEEK IMMEDIATE MEDICAL ATTENTION IF:  You have more than a spotting of blood in your stool.   Your belly is swollen (abdominal distention).   You are nauseated or vomiting.   You have a temperature over 101.   You have abdominal pain or discomfort that is severe or gets worse throughout the day.    Hemorrhoid information provided  Pamphlet on hemorrhoid banding provided  Start Benefiber fiber supplement 1 tablespoon daily for 1 month and then increase to 1 tablespoon twice daily thereafter  Repeat colonoscopy for screening purposes in 10 years  Call me if you have any recurrent rectal bleeding.

## 2017-04-21 NOTE — Progress Notes (Signed)
Dana Payne had a medical procedure on 04/21/2017 at [facility] and my return to work after 5:00pm on 04/22/2017.  Selena Lesser RN [facility]

## 2017-04-21 NOTE — Discharge Instructions (Signed)
Colonoscopy, Adult, Care After This sheet gives you information about how to care for yourself after your procedure. Your doctor may also give you more specific instructions. If you have problems or questions, call your doctor. Follow these instructions at home: General instructions   For the first 24 hours after the procedure: ? Do not drive or use machinery. ? Do not sign important documents. ? Do not drink alcohol. ? Do your daily activities more slowly than normal. ? Eat foods that are soft and easy to digest. ? Rest often.  Take over-the-counter or prescription medicines only as told by your doctor.  It is up to you to get the results of your procedure. Ask your doctor, or the department performing the procedure, when your results will be ready. To help cramping and bloating:  Try walking around.  Put heat on your belly (abdomen) as told by your doctor. Use a heat source that your doctor recommends, such as a moist heat pack or a heating pad. ? Put a towel between your skin and the heat source. ? Leave the heat on for 20-30 minutes. ? Remove the heat if your skin turns bright red. This is especially important if you cannot feel pain, heat, or cold. You can get burned. Eating and drinking  Drink enough fluid to keep your pee (urine) clear or pale yellow.  Return to your normal diet as told by your doctor. Avoid heavy or fried foods that are hard to digest.  Avoid drinking alcohol for as long as told by your doctor. Contact a doctor if:  You have blood in your poop (stool) 2-3 days after the procedure. Get help right away if:  You have more than a small amount of blood in your poop.  You see large clumps of tissue (blood clots) in your poop.  Your belly is swollen.  You feel sick to your stomach (nauseous).  You throw up (vomit).  You have a fever.  You have belly pain that gets worse, and medicine does not help your pain. This information is not intended to  replace advice given to you by your health care provider. Make sure you discuss any questions you have with your health care provider.    Hemorrhoids Hemorrhoids are swollen veins in and around the rectum or anus. Hemorrhoids can cause pain, itching, or bleeding. Most of the time, they do not cause serious problems. They usually get better with diet changes, lifestyle changes, and other home treatments. Follow these instructions at home: Eating and drinking  Eat foods that have fiber, such as whole grains, beans, nuts, fruits, and vegetables. Ask your doctor about taking products that have added fiber (fibersupplements).  Drink enough fluid to keep your pee (urine) clear or pale yellow. For Pain and Swelling  Take a warm-water bath (sitz bath) for 20 minutes to ease pain. Do this 3-4 times a day.  If directed, put ice on the painful area. It may be helpful to use ice between your warm baths. ? Put ice in a plastic bag. ? Place a towel between your skin and the bag. ? Leave the ice on for 20 minutes, 2-3 times a day. General instructions  Take over-the-counter and prescription medicines only as told by your doctor. ? Medicated creams and medicines that are inserted into the anus (suppositories) may be used or applied as told.  Exercise often.  Go to the bathroom when you have the urge to poop (to have a bowel movement). Do not  wait.  Avoid pushing too hard (straining) when you poop.  Keep the butt area dry and clean. Use wet toilet paper or moist paper towels.  Do not sit on the toilet for a long time. Contact a doctor if:  You have any of these: ? Pain and swelling that do not get better with treatment or medicine. ? Bleeding that will not stop. ? Trouble pooping or you cannot poop. ? Pain or swelling outside the area of the hemorrhoids. This information is not intended to replace advice given to you by your health care provider. Make sure you discuss any questions you have  with your health care provider.

## 2017-04-25 ENCOUNTER — Encounter (HOSPITAL_COMMUNITY): Payer: Self-pay | Admitting: Internal Medicine

## 2017-04-27 LAB — HM DIABETES EYE EXAM

## 2017-04-29 ENCOUNTER — Ambulatory Visit: Payer: BLUE CROSS/BLUE SHIELD | Admitting: Gastroenterology

## 2017-05-10 ENCOUNTER — Encounter: Payer: Self-pay | Admitting: General Practice

## 2017-05-11 ENCOUNTER — Encounter: Payer: Self-pay | Admitting: Endocrinology

## 2017-05-11 ENCOUNTER — Ambulatory Visit: Payer: BLUE CROSS/BLUE SHIELD | Admitting: Endocrinology

## 2017-05-11 VITALS — BP 112/70 | HR 73 | Wt 159.2 lb

## 2017-05-11 DIAGNOSIS — E1142 Type 2 diabetes mellitus with diabetic polyneuropathy: Secondary | ICD-10-CM

## 2017-05-11 LAB — POCT GLYCOSYLATED HEMOGLOBIN (HGB A1C): HEMOGLOBIN A1C: 8.6

## 2017-05-11 MED ORDER — DAPAGLIFLOZIN PROPANEDIOL 10 MG PO TABS: 10.0000 mg | ORAL_TABLET | Freq: Every day | ORAL | 11 refills | Status: DC

## 2017-05-11 MED ORDER — SITAGLIPTIN PHOSPHATE 100 MG PO TABS: 100.0000 mg | ORAL_TABLET | Freq: Every day | ORAL | 3 refills | Status: DC

## 2017-05-11 MED ORDER — LISINOPRIL-HYDROCHLOROTHIAZIDE 20-12.5 MG PO TABS: 0.5000 | ORAL_TABLET | Freq: Every day | ORAL | 1 refills | Status: DC

## 2017-05-11 MED ORDER — SITAGLIPTIN PHOSPHATE 100 MG PO TABS: 100.0000 mg | ORAL_TABLET | Freq: Every day | ORAL | 6 refills | Status: DC

## 2017-05-11 MED ORDER — GLUCOSE BLOOD VI STRP: 1.0000 | ORAL_STRIP | Freq: Every day | 3 refills | Status: DC

## 2017-05-11 NOTE — Progress Notes (Signed)
Subjective:    Patient ID: Dana Payne, female    DOB: Mar 15, 1966, 52 y.o.   MRN: [id number]  HPI Pt returns for f/u of diabetes mellitus: DM type: 2 Dx'ed: 4098 Complications: polyneuropathy Therapy: 2 oral meds GDM: never DKA: never Severe hypoglycemia: never Pancreatitis: never Pancreatic imaging: normal on 2012 CT Other: she refuses insulin Interval history: no cbg record, but states cbg's vary from 90 up to the mid-100's.  pt states she feels well in general. Past Medical History:  Diagnosis Date  . Diabetes mellitus   . GERD (gastroesophageal reflux disease)   . HTN (hypertension)   . Hypercholesteremia   . Hypercholesterolemia   . Migraines   . Type II diabetes mellitus ([place])     Past Surgical History:  Procedure Laterality Date  . COLONOSCOPY  2012  . COLONOSCOPY N/A 04/21/2017   Procedure: COLONOSCOPY;  Surgeon: Daneil Dolin, MD;  Location: [facility];  Service: Endoscopy;  Laterality: N/A;  2:15pm  . LASER ABLATION OF THE CERVIX     dysplasia  . PARTIAL HYSTERECTOMY      Social History   Socioeconomic History  . Marital status: Single    Spouse name: Not on file  . Number of children: Not on file  . Years of education: Not on file  . Highest education level: Not on file  Social Needs  . Financial resource strain: Not on file  . Food insecurity - worry: Not on file  . Food insecurity - inability: Not on file  . Transportation needs - medical: Not on file  . Transportation needs - non-medical: Not on file  Occupational History  . Not on file  Tobacco Use  . Smoking status: Never Smoker  . Smokeless tobacco: Never Used  Substance and Sexual Activity  . Alcohol use: No    Alcohol/week: 0.0 oz  . Drug use: No  . Sexual activity: Yes    Birth control/protection: None    Comment: HYSTERECTOMY  Other Topics Concern  . Not on file  Social History Narrative   SINGLE, NO KIDS. WORKS RETAILS. HOBBIES: COOK, Barrister's clerk, [place].    Current  Outpatient Medications on File Prior to Visit  Medication Sig Dispense Refill  . acetaminophen (TYLENOL) 500 MG tablet Take 500 mg by mouth every 6 (six) hours as needed (for headache.).     Marland Kitchen aspirin EC 81 MG tablet Take 81 mg by mouth daily.    . fluticasone (FLONASE) 50 MCG/ACT nasal spray Place 2 sprays into both nostrils daily. (Patient taking differently: Place 2 sprays into both nostrils daily as needed (for summer/hay fever allergies). ) 16 g 6  . metFORMIN (GLUCOPHAGE-XR) 500 MG 24 hr tablet TAKE TWO TABLETS BY MOUTH ONCE DAILY WITH BREAKFAST 180 tablet 1  . ONE TOUCH ULTRA TEST test strip USE TO TEST BLOOD SUGAR TWICE DAILY 100 each 2  . Polyethyl Glycol-Propyl Glycol (SYSTANE) 0.4-0.3 % SOLN Place 1-2 drops into both eyes 3 (three) times daily as needed (for dry eyes.).    Marland Kitchen polyethylene glycol-electrolytes (TRILYTE) 420 g solution Take 4,000 mLs by mouth as directed. 4000 mL 0  . simvastatin (ZOCOR) 40 MG tablet Take 1 tablet (40 mg total) by mouth at bedtime. 90 tablet 1   No current facility-administered medications on file prior to visit.     No Known Allergies  Family History  Problem Relation Age of Onset  . Colon polyps Maternal Aunt   . Colon polyps Paternal Uncle   .  Colon cancer Maternal Aunt        in 58s when diagnosed.   Marland Kitchen Heart disease Father   . Diabetes Father   . Diabetes Mother   . Diabetes Sister   . Diabetes Brother     BP 112/70 (BP Location: Left Arm, Patient Position: Sitting, Cuff Size: Normal)   Pulse 73   Wt 159 lb 3.2 oz (72.2 kg)   SpO2 99%   BMI 26.49 kg/m   Review of Systems She denies hypoglycemia    Objective:   Physical Exam VITAL SIGNS:  See vs page GENERAL: no distress Pulses: dorsalis pedis intact bilat.   MSK: no deformity of the feet CV: no leg edema Skin:  no ulcer on the feet.  normal color and temp on the feet. Neuro: sensation is intact to touch on the feet  A1c=8.6%  Lab Results  Component Value Date    CREATININE 0.67 02/18/2017   BUN 10 02/18/2017   NA 138 02/18/2017   K 4.6 02/18/2017   CL 99 02/18/2017   CO2 32 02/18/2017       Assessment & Plan:  Type 2 DM, with polyneuropathy: she needs increased rx.  She may be manageable without insulin.  HTN: she should reduce zestoretic if she takes farxiga  Patient Instructions  I have sent a prescription to your pharmacy, to add "farxiga."  This will reduce your need for lisinopril-HCTZ, so please reduce that pill to 1/2 per day.  Here is a new meter.  I have sent a prescription to your pharmacy, for strips. check your blood sugar once a day.  vary the time of day when you check, between before the 3 meals, and at bedtime.  also check if you have symptoms of your blood sugar being too high or too low.  please keep a record of the readings and bring it to your next appointment here (or you can bring the meter itself).  You can write it on any piece of paper.  please call us sooner if your blood sugar goes below 70, or if you have a lot of readings over 200.   Please come back for a follow-up appointment in 2 months.

## 2017-05-11 NOTE — Patient Instructions (Addendum)
I have sent a prescription to your pharmacy, to add "farxiga."  This will reduce your need for lisinopril-HCTZ, so please reduce that pill to 1/2 per day.  Here is a new meter.  I have sent a prescription to your pharmacy, for strips. check your blood sugar once a day.  vary the time of day when you check, between before the 3 meals, and at bedtime.  also check if you have symptoms of your blood sugar being too high or too low.  please keep a record of the readings and bring it to your next appointment here (or you can bring the meter itself).  You can write it on any piece of paper.  please call us sooner if your blood sugar goes below 70, or if you have a lot of readings over 200.   Please come back for a follow-up appointment in 2 months.

## 2017-05-12 ENCOUNTER — Other Ambulatory Visit: Payer: Self-pay | Admitting: Family Medicine

## 2017-06-24 ENCOUNTER — Encounter: Payer: Self-pay | Admitting: Gynecology

## 2017-06-24 ENCOUNTER — Ambulatory Visit (INDEPENDENT_AMBULATORY_CARE_PROVIDER_SITE_OTHER): Payer: BLUE CROSS/BLUE SHIELD | Admitting: Gynecology

## 2017-06-24 VITALS — BP 118/76 | Ht 65.0 in | Wt 158.0 lb

## 2017-06-24 DIAGNOSIS — Z01419 Encounter for gynecological examination (general) (routine) without abnormal findings: Secondary | ICD-10-CM | POA: Diagnosis not present

## 2017-06-24 NOTE — Patient Instructions (Signed)
Follow-up in 1 year for annual exam, sooner if any issues. 

## 2017-06-24 NOTE — Progress Notes (Signed)
    Castella Lerner [birth year] [id number]        52 y.o.  G0P0 for annual gynecologic exam.  Former patient of Dr. Toney Rakes.  Without gynecologic complaints.  Past medical history,surgical history, problem list, medications, allergies, family history and social history were all reviewed and documented as reviewed in the EPIC chart.  ROS:  Performed with pertinent positives and negatives included in the history, assessment and plan.   Additional significant findings : None   Exam: Caryn Bee assistant Vitals:   06/24/17 1215  BP: 118/76  Weight: 158 lb (71.7 kg)  Height: 5\' 5"  (1.651 m)   Body mass index is 26.29 kg/m.  General appearance:  Normal affect, orientation and appearance. Skin: Grossly normal HEENT: Without gross lesions.  No cervical or supraclavicular adenopathy. Thyroid normal.  Lungs:  Clear without wheezing, rales or rhonchi Cardiac: RR, without RMG Abdominal:  Soft, nontender, without masses, guarding, rebound, organomegaly or hernia Breasts:  Examined lying and sitting without masses, retractions, discharge or axillary adenopathy. Pelvic:  Ext, BUS, Vagina: Normal.  Pap smear of vaginal cuff  Adnexa: Without masses or tenderness    Anus and perineum: Normal   Rectovaginal: Normal sphincter tone without palpated masses or tenderness.    Assessment/Plan:  52 y.o. G0P0 female for annual gynecologic exam status post TAH in 2008 for leiomyoma.  Not having any menopausal symptoms such as hot flushes, sweats or vaginal dryness.  1. Mammography 02/2016.  Patient overdue and she knows to call and schedule.  Notes intermittent bilateral mastalgia on and off at times.  Mostly in the tail of [place] regions.  Currently not having any discomfort.  Never any palpable abnormalities on self exam or persistent discomfort that lasts.  Exam is totally normal.  Recommend screening mammogram now and patient agrees to call and schedule. 2. Pap smear 2014.  Pap smear of vaginal cuff  today.  History of laser vaporization of the cervix and a number of years ago for unknown grade dysplasia.  We will continue with screening every 3 years. 3. Colonoscopy 2018.  Repeat at their recommended interval. 4. Health maintenance.  Patient reports routine lab work done elsewhere.  Follow-up 1 year, sooner as needed.   Anastasio Auerbach MD, 12:36 PM 06/24/2017

## 2017-06-24 NOTE — Addendum Note (Signed)
Addended by: Nelva Nay on: 06/24/2017 01:07 PM   Modules accepted: Orders

## 2017-06-28 LAB — PAP IG W/ RFLX HPV ASCU

## 2017-07-08 LAB — HM MAMMOGRAPHY

## 2017-09-16 ENCOUNTER — Encounter: Payer: Self-pay | Admitting: General Practice

## 2017-09-16 ENCOUNTER — Ambulatory Visit: Payer: BLUE CROSS/BLUE SHIELD | Admitting: Family Medicine

## 2017-09-16 ENCOUNTER — Ambulatory Visit: Payer: BLUE CROSS/BLUE SHIELD | Admitting: Endocrinology

## 2017-09-16 ENCOUNTER — Encounter: Payer: Self-pay | Admitting: Family Medicine

## 2017-09-16 ENCOUNTER — Encounter: Payer: Self-pay | Admitting: Endocrinology

## 2017-09-16 ENCOUNTER — Other Ambulatory Visit: Payer: Self-pay

## 2017-09-16 VITALS — BP 108/64 | HR 89 | Temp 97.7°F | Resp 15 | Ht 65.0 in | Wt 158.6 lb

## 2017-09-16 VITALS — BP 116/68 | HR 86 | Temp 98.2°F | Wt 158.1 lb

## 2017-09-16 DIAGNOSIS — E785 Hyperlipidemia, unspecified: Secondary | ICD-10-CM | POA: Diagnosis not present

## 2017-09-16 DIAGNOSIS — E1142 Type 2 diabetes mellitus with diabetic polyneuropathy: Secondary | ICD-10-CM

## 2017-09-16 DIAGNOSIS — E049 Nontoxic goiter, unspecified: Secondary | ICD-10-CM

## 2017-09-16 DIAGNOSIS — I10 Essential (primary) hypertension: Secondary | ICD-10-CM

## 2017-09-16 LAB — BASIC METABOLIC PANEL
BUN: 10 mg/dL (ref 6–23)
CO2: 31 mEq/L (ref 19–32)
CREATININE: 0.75 mg/dL (ref 0.40–1.20)
Calcium: 10.3 mg/dL (ref 8.4–10.5)
Chloride: 100 mEq/L (ref 96–112)
GFR: 104.02 mL/min (ref >60.00)
Glucose, Bld: 202 mg/dL — ABNORMAL HIGH (ref 70–99)
POTASSIUM: 4.7 meq/L (ref 3.5–5.1)
Sodium: 140 mEq/L (ref 135–145)

## 2017-09-16 LAB — CBC WITH DIFFERENTIAL/PLATELET
BASOS PCT: 0.5 % (ref 0.0–3.0)
Basophils Absolute: 0 10*3/uL (ref 0.0–0.1)
EOS ABS: 0.1 10*3/uL (ref 0.0–0.7)
EOS PCT: 1 % (ref 0.0–5.0)
HEMATOCRIT: 43.6 % (ref 36.0–46.0)
HEMOGLOBIN: 14.7 g/dL (ref 12.0–15.0)
Lymphocytes Relative: 31.2 % (ref 12.0–46.0)
Lymphs Abs: 2.1 10*3/uL (ref 0.7–4.0)
MCHC: 33.7 g/dL (ref 30.0–36.0)
MCV: 87.6 fl (ref 78.0–100.0)
Monocytes Absolute: 0.5 10*3/uL (ref 0.1–1.0)
Monocytes Relative: 6.8 % (ref 3.0–12.0)
NEUTROS ABS: 4 10*3/uL (ref 1.4–7.7)
Neutrophils Relative %: 60.5 % (ref 43.0–77.0)
Platelets: 249 10*3/uL (ref 150.0–400.0)
RBC: 4.97 Mil/uL (ref 3.87–5.11)
RDW: 12.8 % (ref 11.5–15.5)
WBC: 6.7 10*3/uL (ref 4.0–10.5)

## 2017-09-16 LAB — HEPATIC FUNCTION PANEL
ALT: 25 U/L (ref 0–35)
AST: 16 U/L (ref 0–37)
Albumin: 4.8 g/dL (ref 3.5–5.2)
Alkaline Phosphatase: 67 U/L (ref 39–117)
BILIRUBIN DIRECT: 0.1 mg/dL (ref 0.0–0.3)
BILIRUBIN TOTAL: 0.6 mg/dL (ref 0.2–1.2)
Total Protein: 7.2 g/dL (ref 6.0–8.3)

## 2017-09-16 LAB — LIPID PANEL
CHOL/HDL RATIO: 4
Cholesterol: 135 mg/dL (ref 0–200)
HDL: 38.6 mg/dL — ABNORMAL LOW (ref >39.00)
LDL CALC: 76 mg/dL (ref 0–99)
NONHDL: 96.74
TRIGLYCERIDES: 104 mg/dL (ref 0.0–149.0)
VLDL: 20.8 mg/dL (ref 0.0–40.0)

## 2017-09-16 LAB — TSH: TSH: 1.19 u[IU]/mL (ref 0.35–4.50)

## 2017-09-16 MED ORDER — SITAGLIPTIN PHOSPHATE 100 MG PO TABS
100.0000 mg | ORAL_TABLET | Freq: Every day | ORAL | 3 refills | Status: DC
Start: 2017-09-16 — End: 2018-12-29

## 2017-09-16 NOTE — Progress Notes (Signed)
Subjective:    Patient ID: Dana Payne, female    DOB: 03-02-1966, 52 y.o.   MRN: [id number]  HPI Pt returns for f/u of diabetes mellitus: DM type: 2 Dx'ed: 5427 Complications: polyneuropathy Therapy: 3 oral meds GDM: never DKA: never Severe hypoglycemia: never Pancreatitis: never.   Pancreatic imaging: normal on 2012 CT Other: she refuses insulin.   Interval history: no cbg record, but states cbg's vary from 118 up to 300.  pt states she feels well in general.  She did not take farxiga.   Past Medical History:  Diagnosis Date  . Diabetes mellitus   . GERD (gastroesophageal reflux disease)   . HTN (hypertension)   . Hypercholesteremia   . Hypercholesterolemia   . Migraines   . Type II diabetes mellitus ([place])     Past Surgical History:  Procedure Laterality Date  . ABDOMINAL HYSTERECTOMY  2008   Leiomyoma/menorrhagia  . COLONOSCOPY  2012  . COLONOSCOPY N/A 04/21/2017   Procedure: COLONOSCOPY;  Surgeon: Daneil Dolin, MD;  Location: [facility];  Service: Endoscopy;  Laterality: N/A;  2:15pm  . LASER ABLATION OF THE CERVIX     dysplasia    Social History   Socioeconomic History  . Marital status: Single    Spouse name: Not on file  . Number of children: Not on file  . Years of education: Not on file  . Highest education level: Not on file  Occupational History  . Not on file  Social Needs  . Financial resource strain: Not on file  . Food insecurity:    Worry: Not on file    Inability: Not on file  . Transportation needs:    Medical: Not on file    Non-medical: Not on file  Tobacco Use  . Smoking status: Never Smoker  . Smokeless tobacco: Never Used  Substance and Sexual Activity  . Alcohol use: No    Alcohol/week: 0.0 oz  . Drug use: No  . Sexual activity: Yes    Birth control/protection: None    Comment: HYSTERECTOMY-1st intercourse 25 yo-5 partners  Lifestyle  . Physical activity:    Days per week: Not on file    Minutes per session: Not  on file  . Stress: Not on file  Relationships  . Social connections:    Talks on phone: Not on file    Gets together: Not on file    Attends religious service: Not on file    Active member of club or organization: Not on file    Attends meetings of clubs or organizations: Not on file    Relationship status: Not on file  . Intimate partner violence:    Fear of current or ex partner: Not on file    Emotionally abused: Not on file    Physically abused: Not on file    Forced sexual activity: Not on file  Other Topics Concern  . 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    Current Outpatient Medications on File Prior to Visit  Medication Sig Dispense Refill  . acetaminophen (TYLENOL) 500 MG tablet Take 500 mg by mouth every 6 (six) hours as needed (for headache.).     Marland Kitchen aspirin EC 81 MG tablet Take 81 mg by mouth daily.    . fluticasone (FLONASE) 50 MCG/ACT nasal spray Place 2 sprays into both nostrils daily. (Patient taking differently: Place 2 sprays into both nostrils daily as needed (  for summer/hay fever allergies). ) 16 g 6  . glucose blood (CONTOUR NEXT TEST) test strip 1 each by Other route daily. And lancets 1/day 100 each 3  . lisinopril-hydrochlorothiazide (PRINZIDE,ZESTORETIC) 20-12.5 MG tablet TAKE ONE TABLET BY MOUTH ONCE DAILY 90 tablet 1  . metFORMIN (GLUCOPHAGE-XR) 500 MG 24 hr tablet TAKE TWO TABLETS BY MOUTH ONCE DAILY WITH BREAKFAST 180 tablet 1  . ONE TOUCH ULTRA TEST test strip USE TO TEST BLOOD SUGAR TWICE DAILY 100 each 2  . Polyethyl Glycol-Propyl Glycol (SYSTANE) 0.4-0.3 % SOLN Place 1-2 drops into both eyes 3 (three) times daily as needed (for dry eyes.).    Marland Kitchen simvastatin (ZOCOR) 40 MG tablet Take 1 tablet (40 mg total) by mouth at bedtime. 90 tablet 1   No current facility-administered medications on file prior to visit.     No Known Allergies  Family History  Problem Relation Age of Onset    . Colon polyps Maternal Aunt   . Colon polyps Paternal Uncle   . Colon cancer Maternal Aunt        in 62s when diagnosed.   Marland Kitchen Heart disease Father   . Diabetes Father   . Diabetes Mother   . Diabetes Sister   . Diabetes Brother   . Cancer Maternal Aunt        Pancreatic    BP 116/68   Pulse 86   Temp 98.2 F (36.8 C) (Oral)   Wt 158 lb 2 oz (71.7 kg)   SpO2 98%   BMI 26.31 kg/m    Review of Systems She denies hypoglycemia.      Objective:   Physical Exam VITAL SIGNS:  See vs page GENERAL: no distress Pulses: foot pulses are intact bilaterally.   MSK: no deformity of the feet or ankles.  CV: no edema of the legs or ankles Skin:  no ulcer on the feet or ankles.  normal color and temp on the feet and ankles Neuro: sensation is intact to touch on the feet and ankles.     A1c=8.1%    Assessment & Plan:  Type 2 DM, with polyneuropathy: she needs increased rx.  We discussed.  She declines to add another medication now.    Patient Instructions  Please continue the same medications.   check your blood sugar once a day.  vary the time of day when you check, between before the 3 meals, and at bedtime.  also check if you have symptoms of your blood sugar being too high or too low.  please keep a record of the readings and bring it to your next appointment here (or you can bring the meter itself).  You can write it on any piece of paper.  please call us sooner if your blood sugar goes below 70, or if you have a lot of readings over 200.   Please come back for a follow-up appointment in 2 months.

## 2017-09-16 NOTE — Progress Notes (Signed)
   Subjective:    Patient ID: Dana Payne, female    DOB: 23-Mar-1966, 52 y.o.   MRN: [id number]  HPI HTN- chronic problem, on Lisinopril HCTZ 20/12.5mg  daily w/ good control.  No CP, SOB, HAs, visual changes, edema.  Hyperlipidemia- chronic problem, on Simvastatin 40mg  daily.  No abd pain, N/V, myalgias.  Continues to exercise.  Goiter- pt had last [place] 2 yrs ago.  Has never had bx.  No pain.  Doesn't feel that it has changed in size or shape.   Review of Systems For ROS see HPI     Objective:   Physical Exam  Constitutional: She is oriented to person, place, and time. She appears well-developed and well-nourished. No distress.  HENT:  Head: Normocephalic and atraumatic.  Eyes: Pupils are equal, round, and reactive to light. Conjunctivae and EOM are normal.  Neck: Normal range of motion. Neck supple. Thyromegaly (multinodular goiter) present.  Cardiovascular: Normal rate, regular rhythm, normal heart sounds and intact distal pulses.  No murmur heard. Pulmonary/Chest: Effort normal and breath sounds normal. No respiratory distress.  Abdominal: Soft. She exhibits no distension. There is no tenderness.  Musculoskeletal: She exhibits no edema.  Lymphadenopathy:    She has no cervical adenopathy.  Neurological: She is alert and oriented to person, place, and time.  Skin: Skin is warm and dry.  Psychiatric: She has a normal mood and affect. Her behavior is normal.  Vitals reviewed.         Assessment & Plan:

## 2017-09-16 NOTE — Patient Instructions (Signed)
Schedule your complete physical in 3-4 months We'll notify you of your lab results and make any changes if needed Keep up the good work on healthy diet and regular exercise- you're doing great! Call with any questions or concerns Have a great summer!!

## 2017-09-16 NOTE — Assessment & Plan Note (Signed)
Chronic problem.  Tolerating statin w/o difficulty.  Applauded efforts at diet and exercise.  Check labs.  Adjust meds prn  

## 2017-09-16 NOTE — Patient Instructions (Addendum)
Please continue the same medications check your blood sugar once a day.  vary the time of day when you check, between before the 3 meals, and at bedtime.  also check if you have symptoms of your blood sugar being too high or too low.  please keep a record of the readings and bring it to your next appointment here (or you can bring the meter itself).  You can write it on any piece of paper.  please call us sooner if your blood sugar goes below 70, or if you have a lot of readings over 200.  Please come back for a follow-up appointment in 2 months.  

## 2017-09-16 NOTE — Assessment & Plan Note (Signed)
Ongoing issue.  Pt last had imaging 2017.  Has never had bx.  Offered repeat US as it is going on 2 yrs since last assessed.  Pt prefers to wait another year.  Will continue to follow.

## 2017-09-16 NOTE — Assessment & Plan Note (Signed)
Chronic problem.  Excellent control today.  Asymptomatic.  Check labs.  No anticipated med changes.  Will follow. 

## 2017-09-21 ENCOUNTER — Encounter: Payer: Self-pay | Admitting: General Practice

## 2017-09-28 ENCOUNTER — Other Ambulatory Visit: Payer: Self-pay | Admitting: Family Medicine

## 2017-10-21 ENCOUNTER — Encounter: Payer: Self-pay | Admitting: Family Medicine

## 2017-10-21 ENCOUNTER — Other Ambulatory Visit: Payer: Self-pay

## 2017-10-21 ENCOUNTER — Ambulatory Visit: Payer: BLUE CROSS/BLUE SHIELD | Admitting: Family Medicine

## 2017-10-21 ENCOUNTER — Encounter: Payer: Self-pay | Admitting: General Practice

## 2017-10-21 VITALS — BP 110/72 | HR 76 | Temp 97.9°F | Resp 16 | Ht 65.0 in | Wt 157.5 lb

## 2017-10-21 DIAGNOSIS — J329 Chronic sinusitis, unspecified: Secondary | ICD-10-CM

## 2017-10-21 DIAGNOSIS — B9689 Other specified bacterial agents as the cause of diseases classified elsewhere: Secondary | ICD-10-CM

## 2017-10-21 MED ORDER — AMOXICILLIN 875 MG PO TABS: 875.0000 mg | ORAL_TABLET | Freq: Two times a day (BID) | ORAL | 0 refills | Status: DC

## 2017-10-21 MED ORDER — GUAIFENESIN-CODEINE 100-10 MG/5ML PO SYRP: 10.0000 mL | ORAL_SOLUTION | Freq: Three times a day (TID) | ORAL | 0 refills | Status: DC | PRN

## 2017-10-21 NOTE — Progress Notes (Signed)
   Subjective:    Patient ID: Dana Payne, female    DOB: 01/28/1966, 52 y.o.   MRN: [id number]  HPI URI- sxs started 'over 2 weeks' ago.  Started to feel better but then worsened again.  + nasal congestion.  HA.  + facial pain.  Body aches.  Bilateral ear popping.  No tooth pain.  + sore throat.  Last week had subjective low grade fever.  + sick contacts.   Review of Systems For ROS see HPI     Objective:   Physical Exam  Constitutional: She appears well-developed and well-nourished. No distress.  HENT:  Head: Normocephalic and atraumatic.  Right Ear: Tympanic membrane normal.  Left Ear: Tympanic membrane normal.  Nose: Mucosal edema and rhinorrhea present. Right sinus exhibits maxillary sinus tenderness. Right sinus exhibits no frontal sinus tenderness. Left sinus exhibits maxillary sinus tenderness. Left sinus exhibits no frontal sinus tenderness.  Mouth/Throat: Uvula is midline and mucous membranes are normal. Posterior oropharyngeal erythema present. No oropharyngeal exudate.  Eyes: Pupils are equal, round, and reactive to light. Conjunctivae and EOM are normal.  Neck: Normal range of motion. Neck supple.  Cardiovascular: Normal rate, regular rhythm and normal heart sounds.  Pulmonary/Chest: Effort normal and breath sounds normal. No respiratory distress. She has no wheezes.  Lymphadenopathy:    She has no cervical adenopathy.  Vitals reviewed.         Assessment & Plan:  Bacterial sinusitis- new.  Start abx.  Reviewed supportive care and red flags that should prompt return.  Pt expressed understanding and is in agreement w/ plan.

## 2017-10-21 NOTE — Addendum Note (Signed)
Addended by: [name] on: 10/21/2017 11:17 AM   Modules accepted: Orders

## 2017-10-21 NOTE — Patient Instructions (Signed)
Follow up as needed or as scheduled START the Amoxicillin twice daily- take w/ food Drink plenty of fluids REST! Call with any questions or concerns Hang in there!!!

## 2017-11-13 ENCOUNTER — Other Ambulatory Visit: Payer: Self-pay | Admitting: Family Medicine

## 2018-01-06 ENCOUNTER — Other Ambulatory Visit: Payer: Self-pay | Admitting: Family Medicine

## 2018-01-06 ENCOUNTER — Telehealth: Payer: Self-pay | Admitting: Family Medicine

## 2018-01-06 NOTE — Telephone Encounter (Signed)
Copied from [place] [phone]. Topic: Quick Communication - See Telephone Encounter >> Jan 06, 2018  4:31 PM Sheran Luz wrote: CRM for notification. See Telephone encounter for: 01/06/18.  Pt would like to know if she could come in for blood work to check her blood sugar level. Pt is requesting a call back to discuss/schedule [phone]

## 2018-01-06 NOTE — Telephone Encounter (Signed)
Called patient and made an appointment for her follow up for her BP and her Hgb A1c. Patient is scheduled to come in on 01/19/18 at 9:30 am.

## 2018-01-18 ENCOUNTER — Telehealth: Payer: Self-pay | Admitting: Endocrinology

## 2018-01-19 ENCOUNTER — Ambulatory Visit: Payer: BLUE CROSS/BLUE SHIELD | Admitting: Endocrinology

## 2018-01-19 ENCOUNTER — Other Ambulatory Visit: Payer: Self-pay

## 2018-01-19 ENCOUNTER — Encounter: Payer: Self-pay | Admitting: Family Medicine

## 2018-01-19 ENCOUNTER — Encounter: Payer: Self-pay | Admitting: General Practice

## 2018-01-19 ENCOUNTER — Ambulatory Visit: Payer: BLUE CROSS/BLUE SHIELD | Admitting: Family Medicine

## 2018-01-19 ENCOUNTER — Encounter: Payer: Self-pay | Admitting: Endocrinology

## 2018-01-19 VITALS — BP 108/76 | HR 94 | Ht 65.0 in | Wt 158.6 lb

## 2018-01-19 VITALS — BP 112/80 | HR 93 | Temp 98.1°F | Resp 16 | Ht 65.0 in | Wt 159.4 lb

## 2018-01-19 DIAGNOSIS — E785 Hyperlipidemia, unspecified: Secondary | ICD-10-CM

## 2018-01-19 DIAGNOSIS — E1142 Type 2 diabetes mellitus with diabetic polyneuropathy: Secondary | ICD-10-CM

## 2018-01-19 DIAGNOSIS — J301 Allergic rhinitis due to pollen: Secondary | ICD-10-CM

## 2018-01-19 DIAGNOSIS — E049 Nontoxic goiter, unspecified: Secondary | ICD-10-CM | POA: Diagnosis not present

## 2018-01-19 DIAGNOSIS — I10 Essential (primary) hypertension: Secondary | ICD-10-CM | POA: Diagnosis not present

## 2018-01-19 LAB — LIPID PANEL
CHOL/HDL RATIO: 4
Cholesterol: 139 mg/dL (ref 0–200)
HDL: 38.8 mg/dL — AB (ref >39.00)
LDL CALC: 66 mg/dL (ref 0–99)
NONHDL: 100.46
Triglycerides: 171 mg/dL — ABNORMAL HIGH (ref 0.0–149.0)
VLDL: 34.2 mg/dL (ref 0.0–40.0)

## 2018-01-19 LAB — CBC WITH DIFFERENTIAL/PLATELET
BASOS ABS: 0 10*3/uL (ref 0.0–0.1)
Basophils Relative: 0.5 % (ref 0.0–3.0)
Eosinophils Absolute: 0.2 10*3/uL (ref 0.0–0.7)
Eosinophils Relative: 2.2 % (ref 0.0–5.0)
HEMATOCRIT: 41.9 % (ref 36.0–46.0)
HEMOGLOBIN: 14.3 g/dL (ref 12.0–15.0)
LYMPHS PCT: 24.2 % (ref 12.0–46.0)
Lymphs Abs: 1.9 10*3/uL (ref 0.7–4.0)
MCHC: 34.2 g/dL (ref 30.0–36.0)
MCV: 85.5 fl (ref 78.0–100.0)
MONOS PCT: 5.1 % (ref 3.0–12.0)
Monocytes Absolute: 0.4 10*3/uL (ref 0.1–1.0)
NEUTROS ABS: 5.2 10*3/uL (ref 1.4–7.7)
Neutrophils Relative %: 68 % (ref 43.0–77.0)
PLATELETS: 273 10*3/uL (ref 150.0–400.0)
RBC: 4.9 Mil/uL (ref 3.87–5.11)
RDW: 12.5 % (ref 11.5–15.5)
WBC: 7.7 10*3/uL (ref 4.0–10.5)

## 2018-01-19 LAB — BASIC METABOLIC PANEL
BUN: 9 mg/dL (ref 6–23)
CALCIUM: 10.2 mg/dL (ref 8.4–10.5)
CO2: 30 mEq/L (ref 19–32)
Chloride: 96 mEq/L (ref 96–112)
Creatinine, Ser: 0.75 mg/dL (ref 0.40–1.20)
GFR: 103.89 mL/min (ref >60.00)
Glucose, Bld: 270 mg/dL — ABNORMAL HIGH (ref 70–99)
Potassium: 4.5 mEq/L (ref 3.5–5.1)
Sodium: 137 mEq/L (ref 135–145)

## 2018-01-19 LAB — HEPATIC FUNCTION PANEL
ALT: 26 U/L (ref 0–35)
AST: 17 U/L (ref 0–37)
Albumin: 4.7 g/dL (ref 3.5–5.2)
Alkaline Phosphatase: 81 U/L (ref 39–117)
BILIRUBIN DIRECT: 0.1 mg/dL (ref 0.0–0.3)
Total Bilirubin: 0.6 mg/dL (ref 0.2–1.2)
Total Protein: 7.3 g/dL (ref 6.0–8.3)

## 2018-01-19 LAB — POCT GLYCOSYLATED HEMOGLOBIN (HGB A1C): Hemoglobin A1C: 9 % — AB (ref 4.0–5.6)

## 2018-01-19 LAB — TSH: TSH: 1.4 u[IU]/mL (ref 0.35–4.50)

## 2018-01-19 MED ORDER — DAPAGLIFLOZIN PROPANEDIOL 10 MG PO TABS: 10.0000 mg | ORAL_TABLET | Freq: Every day | ORAL | 11 refills | Status: DC

## 2018-01-19 NOTE — Assessment & Plan Note (Signed)
Chronic problem.  Tolerating statin w/o difficulty.  Check labs.  Adjust meds prn  

## 2018-01-19 NOTE — Patient Instructions (Signed)
Schedule your complete physical in 6 months We'll notify you of your lab results and make any changes if needed Continue to work on healthy diet and regular exercise- you can do it! We'll call you with the appt for your thyroid ultrasound RESTART your Flonase daily ADD daily Claritin or Zyrtec to improve post-nasal drip and cough Call with any questions or concerns Happy Fall!!!

## 2018-01-19 NOTE — Assessment & Plan Note (Signed)
Due for repeat US- order placed.

## 2018-01-19 NOTE — Assessment & Plan Note (Signed)
Chronic problem.  Well controlled.  Asymptomatic.  Check labs.  No anticipated med changes.  Will follow. 

## 2018-01-19 NOTE — Progress Notes (Signed)
Subjective:    Patient ID: Dana Payne, female    DOB: [birth year], 52 y.o.   MRN: [id number]  HPI Pt returns for f/u of diabetes mellitus: DM type: 2 Dx'ed: 6433 Complications: polyneuropathy Therapy: 2 oral meds GDM: never DKA: never Severe hypoglycemia: never Pancreatitis: never.   Pancreatic imaging: normal on 2012 CT Other: she refuses insulin.   Interval history: no cbg record, but states cbg's vary from 118 up to 300.  pt states she feels well in general.  She did not take farxiga.  Past Medical History:  Diagnosis Date  . Diabetes mellitus   . GERD (gastroesophageal reflux disease)   . HTN (hypertension)   . Hypercholesteremia   . Hypercholesterolemia   . Migraines   . Type II diabetes mellitus ([place])     Past Surgical History:  Procedure Laterality Date  . ABDOMINAL HYSTERECTOMY  2008   Leiomyoma/menorrhagia  . COLONOSCOPY  2012  . COLONOSCOPY N/A 04/21/2017   Procedure: COLONOSCOPY;  Surgeon: Daneil Dolin, MD;  Location: [facility];  Service: Endoscopy;  Laterality: N/A;  2:15pm  . LASER ABLATION OF THE CERVIX     dysplasia    Social History   Socioeconomic History  . Marital status: Single    Spouse name: Not on file  . Number of children: Not on file  . Years of education: Not on file  . Highest education level: Not on file  Occupational History  . Not on file  Social Needs  . Financial resource strain: Not on file  . Food insecurity:    Worry: Not on file    Inability: Not on file  . Transportation needs:    Medical: Not on file    Non-medical: Not on file  Tobacco Use  . Smoking status: Never Smoker  . Smokeless tobacco: Never Used  Substance and Sexual Activity  . Alcohol use: No    Alcohol/week: 0.0 standard drinks  . Drug use: No  . Sexual activity: Yes    Birth control/protection: None    Comment: HYSTERECTOMY-1st intercourse 6 yo-5 partners  Lifestyle  . Physical activity:    Days per week: Not on file    Minutes per  session: Not on file  . Stress: Not on file  Relationships  . Social connections:    Talks on phone: Not on file    Gets together: Not on file    Attends religious service: Not on file    Active member of club or organization: Not on file    Attends meetings of clubs or organizations: Not on file    Relationship status: Not on file  . Intimate partner violence:    Fear of current or ex partner: Not on file    Emotionally abused: Not on file    Physically abused: Not on file    Forced sexual activity: Not on file  Other Topics Concern  . 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    Current Outpatient Medications on File Prior to Visit  Medication Sig Dispense Refill  . acetaminophen (TYLENOL) 500 MG tablet Take 500 mg by mouth every 6 (six) hours as needed (for headache.).     Marland Kitchen aspirin EC 81 MG tablet Take 81 mg by mouth daily.    . fluticasone (FLONASE) 50 MCG/ACT nasal spray Place 2 sprays into both nostrils daily. (Patient taking differently: Place 2 sprays into both nostrils daily as needed (  for summer/hay fever allergies). ) 16 g 6  . glucose blood (CONTOUR NEXT TEST) test strip 1 each by Other route daily. And lancets 1/day 100 each 3  . lisinopril-hydrochlorothiazide (PRINZIDE,ZESTORETIC) 20-12.5 MG tablet TAKE 1 TABLET BY MOUTH ONCE DAILY 90 tablet 1  . metFORMIN (GLUCOPHAGE-XR) 500 MG 24 hr tablet TAKE 2 TABLETS BY MOUTH ONCE DAILY WITH BREAKFAST 180 tablet 1  . ONE TOUCH ULTRA TEST test strip USE TO TEST BLOOD SUGAR TWICE DAILY 100 each 2  . Polyethyl Glycol-Propyl Glycol (SYSTANE) 0.4-0.3 % SOLN Place 1-2 drops into both eyes 3 (three) times daily as needed (for dry eyes.).    Marland Kitchen simvastatin (ZOCOR) 40 MG tablet TAKE 1 TABLET BY MOUTH ONCE DAILY AT BEDTIME 90 tablet 1  . sitaGLIPtin (JANUVIA) 100 MG tablet Take 1 tablet (100 mg total) by mouth daily. 90 tablet 3   No current facility-administered medications  on file prior to visit.     No Known Allergies  Family History  Problem Relation Age of Onset  . Colon polyps Maternal Aunt   . Colon polyps Paternal Uncle   . Colon cancer Maternal Aunt        in 21s when diagnosed.   Marland Kitchen Heart disease Father   . Diabetes Father   . Diabetes Mother   . Diabetes Sister   . Diabetes Brother   . Cancer Maternal Aunt        Pancreatic    BP 108/76   Pulse 94   Ht 5\' 5"  (1.651 m)   Wt 158 lb 9.6 oz (71.9 kg)   SpO2 96%   BMI 26.39 kg/m    Review of Systems No weight change.     Objective:   Physical Exam VITAL SIGNS:  See vs page GENERAL: no distress Pulses: foot pulses are intact bilaterally.   MSK: no deformity of the feet or ankles.  CV: no edema of the legs or ankles Skin:  no ulcer on the feet or ankles.  normal color and temp on the feet and ankles Neuro: sensation is intact to touch on the feet and ankles.    A1c=9.0%  Lab Results  Component Value Date   CREATININE 0.75 01/19/2018   BUN 9 01/19/2018   NA 137 01/19/2018   K 4.5 01/19/2018   CL 96 01/19/2018   CO2 30 01/19/2018       Assessment & Plan:  Type 2 DM, with polyneuropathy: worse.  We discussed.  She agrees to add farxiga HTN: well-controlled to overcontrolled. We'll have to recheck next time. She may need reduction of zestoretic.  Patient Instructions  I have sent a prescription to your pharmacy, to add "Farxiga."   check your blood sugar once a day.  vary the time of day when you check, between before the 3 meals, and at bedtime.  also check if you have symptoms of your blood sugar being too high or too low.  please keep a record of the readings and bring it to your next appointment here (or you can bring the meter itself).  You can write it on any piece of paper.  please call us sooner if your blood sugar goes below 70, or if you have a lot of readings over 200.   Please come back for a follow-up appointment in 2 months.

## 2018-01-19 NOTE — Progress Notes (Signed)
   Subjective:    Patient ID: Dana Payne, female    DOB: June 25, 1965, 52 y.o.   MRN: [id number]  HPI HTN- chronic problem, on Lisinopril HCTZ 20/12.5mg  daily w/ good control.  No CP, SOB, visual changes, edema.  + HA from cough  DM- chronic problem, on Metformin 1000mg  daily, Januvia 100mg  daily.  UTD on eye exam, foot exam.  On ACE for renal protection.  Has appt w/ Dr Loanne Drilling later this AM.  Pt admits to stress eating.  Hyperlipidemia- chronic problem, on Simvastatin 40mg  daily.  Denies abd pain, N/V.  Cough- sxs started 'over 3 weeks' ago.  Cough is intermittently productive.  No fevers.  + PND.  Not regularly using Flonase, not taking antihistamine.   Review of Systems For ROS see HPI     Objective:   Physical Exam  Constitutional: She is oriented to person, place, and time. She appears well-developed and well-nourished. No distress.  HENT:  Head: Normocephalic and atraumatic.  TMs WNL bilaterally Copious PND  Eyes: Pupils are equal, round, and reactive to light. Conjunctivae and EOM are normal.  Neck: Normal range of motion. Neck supple. Thyromegaly (multinodular goiter) present.  Cardiovascular: Normal rate, regular rhythm, normal heart sounds and intact distal pulses.  No murmur heard. Pulmonary/Chest: Effort normal and breath sounds normal. No respiratory distress.  Abdominal: Soft. She exhibits no distension. There is no tenderness.  Musculoskeletal: She exhibits no edema.  Lymphadenopathy:    She has no cervical adenopathy.  Neurological: She is alert and oriented to person, place, and time.  Skin: Skin is warm and dry.  Psychiatric: She has a normal mood and affect. Her behavior is normal.  Vitals reviewed.         Assessment & Plan:

## 2018-01-19 NOTE — Assessment & Plan Note (Signed)
Chronic problem.  Now following w/ Dr Loanne Drilling.  UTD on foot exam, eye exam, and on ACE for renal protection.  Pt admits to poor diet.  Stressed need for improvement.

## 2018-01-19 NOTE — Assessment & Plan Note (Signed)
Deteriorated.  Pt is not taking her Flonase regularly and is not on an antihistamine.  Start daily Claritin or Zyrtec and restart Flonase.  Will follow.

## 2018-01-19 NOTE — Patient Instructions (Addendum)
I have sent a prescription to your pharmacy, to add "Farxiga."   check your blood sugar once a day.  vary the time of day when you check, between before the 3 meals, and at bedtime.  also check if you have symptoms of your blood sugar being too high or too low.  please keep a record of the readings and bring it to your next appointment here (or you can bring the meter itself).  You can write it on any piece of paper.  please call us sooner if your blood sugar goes below 70, or if you have a lot of readings over 200.   Please come back for a follow-up appointment in 2 months.

## 2018-01-23 NOTE — Telephone Encounter (Signed)
error 

## 2018-01-27 ENCOUNTER — Telehealth: Payer: Self-pay

## 2018-01-27 MED ORDER — EMPAGLIFLOZIN 25 MG PO TABS: 25.0000 mg | ORAL_TABLET | Freq: Every day | ORAL | 11 refills | Status: DC

## 2018-01-27 NOTE — Addendum Note (Signed)
Addended by: Renato Shin on: 01/27/2018 02:17 PM   Modules accepted: Orders

## 2018-01-27 NOTE — Telephone Encounter (Signed)
farxiga or invokana are ok with me.  Which does ins prefer?

## 2018-01-27 NOTE — Telephone Encounter (Signed)
Ok, I changed and resent

## 2018-01-27 NOTE — Telephone Encounter (Signed)
Received fax from [facility] that a prior authorization is required for [place]. Would you like this done or switch patient to another medication?

## 2018-01-27 NOTE — Telephone Encounter (Signed)
Vania Rea is the insurances preference

## 2018-02-06 ENCOUNTER — Ambulatory Visit: Payer: BLUE CROSS/BLUE SHIELD | Admitting: Gynecology

## 2018-02-07 ENCOUNTER — Encounter: Payer: Self-pay | Admitting: Gynecology

## 2018-02-07 ENCOUNTER — Ambulatory Visit: Payer: BLUE CROSS/BLUE SHIELD | Admitting: Gynecology

## 2018-02-07 VITALS — BP 118/78

## 2018-02-07 DIAGNOSIS — N939 Abnormal uterine and vaginal bleeding, unspecified: Secondary | ICD-10-CM | POA: Diagnosis not present

## 2018-02-07 LAB — WET PREP FOR TRICH, YEAST, CLUE

## 2018-02-07 NOTE — Patient Instructions (Signed)
Call and follow-up as soon as possible if you have any recurrence of bleeding.

## 2018-02-07 NOTE — Addendum Note (Signed)
Addended by: Nelva Nay on: 02/07/2018 04:58 PM   Modules accepted: Orders

## 2018-02-07 NOTE — Progress Notes (Signed)
    Dana Payne 1965-09-03 [id number]        52 y.o.  G0P0 presents with an episode of vaginal bleeding last week.  Patient has a history of TAH for leiomyoma in 2008.  Has done no bleeding since her hysterectomy until last week.  Had noticed some lower pelvic discomfort and then when she went to take a shower and noticed a little blood in her underwear.  No bleeding since.  No vaginal discharge, irritation or odor.  No urinary symptoms such as frequency dysuria urgency low back pain fever or chills.  No hemorrhoidal issues.  Feels that it was definitely vaginal.  Past medical history,surgical history, problem list, medications, allergies, family history and social history were all reviewed and documented in the EPIC chart.  Directed ROS with pertinent positives and negatives documented in the history of present illness/assessment and plan.  Exam: Caryn Bee assistant Vitals:   02/07/18 1612  BP: 118/78   General appearance:  Normal Abdomen soft nontender without masses guarding rebound Pelvic external BUS vagina with white discharge.  No bleeding noted.  Proctoscopy swab probing of the entire vaginal vault without evidence of bleeding.  Digital exam palpates no abnormalities.  Bimanual without masses or tenderness.  Rectal exam is normal without evidence of fissure or significant hemorrhoids.  Assessment/Plan:  52 y.o. G0P0 with episode of vaginal bleeding status post hysterectomy.  Exam is normal at this time.  Wet prep is negative.  Urinalysis is negative.  No associated GI or GU symptoms.  Pap smear 08/2017 normal.  Discussed differential with the patient.  Options for further evaluation to include colposcopy versus observation and if any recurrent bleeding follow-up ASAP that day for examination to see if we can find the source of the bleeding.  Patient is comfortable with this approach and will call if she does any further bleeding to be seen ASAP.  I spent a total of 15 face-to-face  minutes with the patient, over 50% was spent counseling and coordination of care.    Anastasio Auerbach MD, 4:48 PM 02/07/2018

## 2018-02-08 LAB — URINALYSIS, COMPLETE W/RFL CULTURE
BACTERIA UA: NONE SEEN /HPF
BILIRUBIN URINE: NEGATIVE
Glucose, UA: NEGATIVE
Hyaline Cast: NONE SEEN /LPF
Ketones, ur: NEGATIVE
LEUKOCYTE ESTERASE: NEGATIVE
NITRITES URINE, INITIAL: NEGATIVE
PH: 5.5 (ref 5.0–8.0)
Protein, ur: NEGATIVE
RBC / HPF: NONE SEEN /HPF (ref 0–2)
SPECIFIC GRAVITY, URINE: 1.01 (ref 1.001–1.03)
WBC, UA: NONE SEEN /HPF (ref 0–5)

## 2018-02-08 LAB — NO CULTURE INDICATED

## 2018-02-16 ENCOUNTER — Other Ambulatory Visit (INDEPENDENT_AMBULATORY_CARE_PROVIDER_SITE_OTHER): Payer: Self-pay | Admitting: *Deleted

## 2018-03-10 ENCOUNTER — Telehealth: Payer: Self-pay | Admitting: Emergency Medicine

## 2018-03-10 DIAGNOSIS — E049 Nontoxic goiter, unspecified: Secondary | ICD-10-CM

## 2018-03-10 NOTE — Telephone Encounter (Signed)
LMOVM advising patient will place another order for the Thyroid US but wanted to verify the location she wanted to have it. [facility] or [facility].  Copied from [place] [phone]. Topic: General - Other >> Mar 10, 2018  8:33 AM Leward Quan A wrote: Reason for CRM: Patient called to say that she could not get scheduled to have her Thyroid Ultrasound because orders were put in as a requset and not an order. Asking can the referral be changed to an order please and that she is available on 03/17/18 after 10 a.m.. Request a call back with details please.Marland Kitchen Ph# [phone]

## 2018-03-13 NOTE — Telephone Encounter (Signed)
Order placed

## 2018-03-13 NOTE — Addendum Note (Signed)
Addended by: Leonidas Romberg on: 03/13/2018 08:39 AM   Modules accepted: Orders

## 2018-03-13 NOTE — Telephone Encounter (Signed)
Patient is calling back and wants it to be at [facility]. She would like for it to be this Friday 03/17/18 between 12p-1p if possible. Please advise.

## 2018-03-14 ENCOUNTER — Other Ambulatory Visit: Payer: Self-pay | Admitting: Emergency Medicine

## 2018-03-14 DIAGNOSIS — E049 Nontoxic goiter, unspecified: Secondary | ICD-10-CM

## 2018-03-14 NOTE — Telephone Encounter (Signed)
I released order for US Thyroid. Patient is already scheduled for testing.

## 2018-03-14 NOTE — Telephone Encounter (Signed)
Pt called Dana Payne to schedule and was told they never got the first order for Thyroid US and the order yesterday was for a tissue test and it is incorrect. Pt wanting office to call Dana Payne to get the order correct for thyroid US and schedule her for Friday 11/22 12pm-2pm if possible. If not possible any time Friday will be fine. OK to leave pt detailed msg on [phone].

## 2018-03-17 ENCOUNTER — Encounter (HOSPITAL_COMMUNITY): Payer: Self-pay

## 2018-03-17 ENCOUNTER — Ambulatory Visit (HOSPITAL_COMMUNITY): Payer: BLUE CROSS/BLUE SHIELD

## 2018-03-24 IMAGING — NM NM MISC PROCEDURE
3 series · 18 of 18 positions shown · non-contrast
Comparison: none

[Series 1: wbr_r-proj_st rest_(id)_sa · 6.5mm · 6.51mm/px · 6 of 64 frames shown]
[frame 6/64]
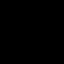
[frame 16/64]
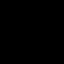
[frame 27/64]
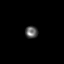
[frame 38/64]
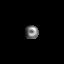
[frame 48/64]
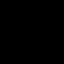
[frame 59/64]
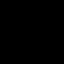

[Series 1: wbr_s-proj_st stress_(id)_sa · 6.5mm · 6.51mm/px · 6 of 512 frames shown (1 of 2)]
[frame 43/512]
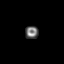
[frame 128/512]
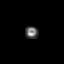
[frame 214/512]
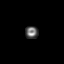
[frame 299/512]
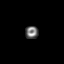
[frame 384/512]
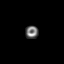
[frame 470/512]
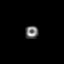

[Series 1: wbr_s-proj_st stress_(id)_sa · 6.5mm · 6.51mm/px · 6 of 64 frames shown (2 of 2)]
[frame 6/64]
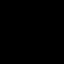
[frame 16/64]
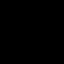
[frame 27/64]
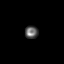
[frame 38/64]
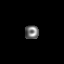
[frame 48/64]
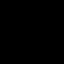
[frame 59/64]
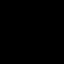

[18 of 18 positions shown; findings below may reference images not displayed]

Canned report from images found in remote index.

Refer to host system for actual result text.

## 2018-03-29 ENCOUNTER — Telehealth: Payer: Self-pay | Admitting: *Deleted

## 2018-03-29 NOTE — Telephone Encounter (Signed)
Yes to colposcopy appointment

## 2018-03-29 NOTE — Telephone Encounter (Signed)
Left message on voicemail to call appointment desk to schedule to schedule.

## 2018-03-29 NOTE — Telephone Encounter (Signed)
Patient called to update from [place] on 02/07/18, states she had recurrent spotting again a "few days ago"  Only needed to wear a panty liner, no bleeding now. States her aunt had bx and just found out she has uterine cancer had same spotting issues per patient. Did you want her to schedule colposcopy now?

## 2018-04-02 ENCOUNTER — Other Ambulatory Visit: Payer: Self-pay | Admitting: Family Medicine

## 2018-04-05 ENCOUNTER — Ambulatory Visit: Payer: BLUE CROSS/BLUE SHIELD | Admitting: Physician Assistant

## 2018-04-05 ENCOUNTER — Encounter: Payer: Self-pay | Admitting: Physician Assistant

## 2018-04-05 ENCOUNTER — Other Ambulatory Visit: Payer: Self-pay

## 2018-04-05 VITALS — BP 128/78 | HR 80 | Temp 98.1°F | Resp 14 | Ht 65.0 in | Wt 156.0 lb

## 2018-04-05 DIAGNOSIS — G43009 Migraine without aura, not intractable, without status migrainosus: Secondary | ICD-10-CM

## 2018-04-05 MED ORDER — SUMATRIPTAN SUCCINATE 25 MG PO TABS: 25.0000 mg | ORAL_TABLET | Freq: Once | ORAL | 0 refills | Status: DC

## 2018-04-05 MED ORDER — ZOLPIDEM TARTRATE 5 MG PO TABS: 5.0000 mg | ORAL_TABLET | Freq: Every evening | ORAL | 0 refills | Status: DC | PRN

## 2018-04-05 NOTE — Progress Notes (Signed)
Patient presents to clinic today c/o recurrence of her migraine headaches over the past few weeks that she feels is associated with significant increase in work stressors causing a lack of sleep. Notes headaches are every few days and can last a day. Are typically one-sided and associated with photophobia and nausea.   Past Medical History:  Diagnosis Date  . Diabetes mellitus   . GERD (gastroesophageal reflux disease)   . HTN (hypertension)   . Hypercholesteremia   . Hypercholesterolemia   . Migraines   . Type II diabetes mellitus ([place])     Current Outpatient Medications on File Prior to Visit  Medication Sig Dispense Refill  . acetaminophen (TYLENOL) 500 MG tablet Take 500 mg by mouth every 6 (six) hours as needed (for headache.).     Marland Kitchen aspirin EC 81 MG tablet Take 81 mg by mouth daily.    . empagliflozin (JARDIANCE) 25 MG TABS tablet Take 25 mg by mouth daily. 30 tablet 11  . fluticasone (FLONASE) 50 MCG/ACT nasal spray Place 2 sprays into both nostrils daily. (Patient taking differently: Place 2 sprays into both nostrils daily as needed (for summer/hay fever allergies). ) 16 g 6  . glucose blood (CONTOUR NEXT TEST) test strip 1 each by Other route daily. And lancets 1/day 100 each 3  . lisinopril-hydrochlorothiazide (PRINZIDE,ZESTORETIC) 20-12.5 MG tablet TAKE 1 TABLET BY MOUTH ONCE DAILY 90 tablet 1  . metFORMIN (GLUCOPHAGE-XR) 500 MG 24 hr tablet TAKE 2 TABLETS BY MOUTH ONCE DAILY WITH BREAKFAST 180 tablet 1  . ONE TOUCH ULTRA TEST test strip USE TO TEST BLOOD SUGAR TWICE DAILY 100 each 2  . Polyethyl Glycol-Propyl Glycol (SYSTANE) 0.4-0.3 % SOLN Place 1-2 drops into both eyes 3 (three) times daily as needed (for dry eyes.).    Marland Kitchen simvastatin (ZOCOR) 40 MG tablet TAKE 1 TABLET BY MOUTH ONCE DAILY AT BEDTIME 90 tablet 1  . sitaGLIPtin (JANUVIA) 100 MG tablet Take 1 tablet (100 mg total) by mouth daily. 90 tablet 3   No current facility-administered medications on file prior to  visit.     No Known Allergies  Family History  Problem Relation Age of Onset  . Colon polyps Maternal Aunt   . Colon polyps Paternal Uncle   . Colon cancer Maternal Aunt        in 62s when diagnosed.   Marland Kitchen Heart disease Father   . Diabetes Father   . Diabetes Mother   . Diabetes Sister   . Diabetes Brother   . Cancer Maternal Aunt        Pancreatic    Social History   Socioeconomic History  . Marital status: Single    Spouse name: Not on file  . Number of children: Not on file  . Years of education: Not on file  . Highest education level: Not on file  Occupational History  . Not on file  Social Needs  . Financial resource strain: Not on file  . Food insecurity:    Worry: Not on file    Inability: Not on file  . Transportation needs:    Medical: Not on file    Non-medical: Not on file  Tobacco Use  . Smoking status: Never Smoker  . Smokeless tobacco: Never Used  Substance and Sexual Activity  . Alcohol use: No    Alcohol/week: 0.0 standard drinks  . Drug use: No  . Sexual activity: Yes    Birth control/protection: None    Comment: HYSTERECTOMY-1st intercourse 15  yo-5 partners  Lifestyle  . Physical activity:    Days per week: Not on file    Minutes per session: Not on file  . Stress: Not on file  Relationships  . Social connections:    Talks on phone: Not on file    Gets together: Not on file    Attends religious service: Not on file    Active member of club or organization: Not on file    Attends meetings of clubs or organizations: Not on file    Relationship status: Not on file  Other Topics Concern  . 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   Review of Systems - See HPI.  All other ROS are negative.  BP 128/78   Pulse 80   Temp 98.1 F (36.7 C) (Oral)   Resp 14   Ht 5\' 5"  (1.651 m)   Wt 156 lb (70.8 kg)   SpO2 98%   BMI 25.96 kg/m   Physical Exam  Constitutional: She is  oriented to person, place, and time. She appears well-developed and well-nourished.  HENT:  Head: Normocephalic and atraumatic.  Eyes: Pupils are equal, round, and reactive to light.  Neck: Neck supple.  Cardiovascular: Normal rate, regular rhythm and normal heart sounds.  Pulmonary/Chest: Effort normal. No respiratory distress.  Neurological: She is alert and oriented to person, place, and time. She has normal strength. Coordination and gait normal.  Vitals reviewed.  Recent Results (from the past 2160 hour(s))  Lipid panel     Status: Abnormal   Collection Time: 01/19/18 10:01 AM  Result Value Ref Range   Cholesterol 139 0 - 200 mg/dL    Comment: ATP III Classification       Desirable:  < 200 mg/dL               Borderline High:  200 - 239 mg/dL          High:  > = 240 mg/dL   Triglycerides 171.0 (H) 0.0 - 149.0 mg/dL    Comment: Normal:  <150 mg/dLBorderline High:  150 - 199 mg/dL   HDL 38.80 (L) >39.00 mg/dL   VLDL 34.2 0.0 - 40.0 mg/dL   LDL Cholesterol 66 0 - 99 mg/dL   Total CHOL/HDL Ratio 4     Comment:                Men          Women1/2 Average Risk     3.4          3.3Average Risk          5.0          4.42X Average Risk          9.6          7.13X Average Risk          15.0          11.0                       NonHDL 100.46     Comment: NOTE:  Non-HDL goal should be 30 mg/dL higher than patient's LDL goal (i.e. LDL goal of < 70 mg/dL, would have non-HDL goal of < 100 mg/dL)  Basic metabolic panel     Status: Abnormal   Collection Time: 01/19/18 10:01 AM  Result Value Ref Range   Sodium 137 135 - 145  mEq/L   Potassium 4.5 3.5 - 5.1 mEq/L   Chloride 96 96 - 112 mEq/L   CO2 30 19 - 32 mEq/L   Glucose, Bld 270 (H) 70 - 99 mg/dL   BUN 9 6 - 23 mg/dL   Creatinine, Ser 0.75 0.40 - 1.20 mg/dL   Calcium 10.2 8.4 - 10.5 mg/dL   GFR 103.89 >60.00 mL/min  Hepatic function panel     Status: None   Collection Time: 01/19/18 10:01 AM  Result Value Ref Range   Total Bilirubin 0.6  0.2 - 1.2 mg/dL   Bilirubin, Direct 0.1 0.0 - 0.3 mg/dL   Alkaline Phosphatase 81 39 - 117 U/L   AST 17 0 - 37 U/L   ALT 26 0 - 35 U/L   Total Protein 7.3 6.0 - 8.3 g/dL   Albumin 4.7 3.5 - 5.2 g/dL  TSH     Status: None   Collection Time: 01/19/18 10:01 AM  Result Value Ref Range   TSH 1.40 0.35 - 4.50 uIU/mL  CBC with Differential/Platelet     Status: None   Collection Time: 01/19/18 10:01 AM  Result Value Ref Range   WBC 7.7 4.0 - 10.5 K/uL   RBC 4.90 3.87 - 5.11 Mil/uL   Hemoglobin 14.3 12.0 - 15.0 g/dL   HCT 41.9 36.0 - 46.0 %   MCV 85.5 78.0 - 100.0 fl   MCHC 34.2 30.0 - 36.0 g/dL   RDW 12.5 11.5 - 15.5 %   Platelets 273.0 150.0 - 400.0 K/uL   Neutrophils Relative % 68.0 43.0 - 77.0 %   Lymphocytes Relative 24.2 12.0 - 46.0 %   Monocytes Relative 5.1 3.0 - 12.0 %   Eosinophils Relative 2.2 0.0 - 5.0 %   Basophils Relative 0.5 0.0 - 3.0 %   Neutro Abs 5.2 1.4 - 7.7 K/uL   Lymphs Abs 1.9 0.7 - 4.0 K/uL   Monocytes Absolute 0.4 0.1 - 1.0 K/uL   Eosinophils Absolute 0.2 0.0 - 0.7 K/uL   Basophils Absolute 0.0 0.0 - 0.1 K/uL  POCT glycosylated hemoglobin (Hb A1C)     Status: Abnormal   Collection Time: 01/19/18 11:47 AM  Result Value Ref Range   Hemoglobin A1C 9.0 (A) 4.0 - 5.6 %   HbA1c POC (<> result, manual entry)     HbA1c, POC (prediabetic range)     HbA1c, POC (controlled diabetic range)    Urinalysis,Complete w/RFL Culture     Status: Abnormal   Collection Time: 02/07/18  4:43 PM  Result Value Ref Range   Color, Urine YELLOW YELLOW   APPearance CLEAR CLEAR   Specific Gravity, Urine 1.010 1.001 - 1.03   pH 5.5 5.0 - 8.0   Glucose, UA NEGATIVE NEGATIVE   Bilirubin Urine NEGATIVE NEGATIVE   Ketones, ur NEGATIVE NEGATIVE   Hgb urine dipstick TRACE (A) NEGATIVE   Protein, ur NEGATIVE NEGATIVE   Nitrites, Initial NEGATIVE NEGATIVE   Leukocyte Esterase NEGATIVE NEGATIVE   WBC, UA NONE SEEN 0 - 5 /HPF   RBC / HPF NONE SEEN 0 - 2 /HPF   Squamous Epithelial / LPF  0-5 < OR = 5 /HPF   Bacteria, UA NONE SEEN NONE SEEN /HPF   Hyaline Cast NONE SEEN NONE SEEN /LPF   Note      Comment: This urine was analyzed for the presence of WBC,  RBC, bacteria, casts, and other formed elements.  Only those elements seen were reported. . .   REFLEXIVE URINE  CULTURE     Status: None   Collection Time: 02/07/18  4:43 PM  Result Value Ref Range   Reflexve Urine Culture NO CULTURE INDICATED   WET PREP FOR [place], YEAST, CLUE     Status: None   Collection Time: 02/07/18  5:02 PM  Result Value Ref Range   Source: VAGINA    RESULT      Comment: EPITHELIAL CELLS-PRESENT CLUE CELLS-NONE SEEN YEAST-NONE SEEN TRICHOMONAS-NONE SEEN WBC-MANY BACTERIA-MODERATE EPITH. CELLS (13-20) HPF    Assessment/Plan: 1. Migraine without aura and without status migrainosus, not intractable Sleep and stress triggered. Mild headache today. Start Excedrin for abortive therapy. Rx Imitrex for abortive therapy if needed. Rx Ambien short-term to help with sleep. Sleep hygiene reviewed. Strict return precautions discussed with patient.  - zolpidem (AMBIEN) 5 MG tablet; Take 1 tablet (5 mg total) by mouth at bedtime as needed for sleep.  Dispense: 15 tablet; Refill: 0   Leeanne Rio, PA-C

## 2018-04-05 NOTE — Patient Instructions (Signed)
Please keep hydrated and try to get plenty of rest. Use the Ambien as directed at night if you are having issue sleeping. Need to be able to devote 7-8 hours to sleep to take this.  For mild headache, use Excedrine migraine OTC. For more significant headaches, you can use the Imitrex as directed.  Follow-up with PCP if symptoms are not easing up.   Sleep Hygiene  Do: (1) Go to bed at the same time each day. (2) Get up from bed at the same time each day. (3) Get regular exercise each day, preferably in the morning.  There is goof evidence that regular exercise improves restful sleep.  This includes stretching and aerobic exercise. (4) Get regular exposure to outdoor or bright lights, especially in the late afternoon. (5) Keep the temperature in your bedroom comfortable. (6) Keep the bedroom quiet when sleeping. (7) Keep the bedroom dark enough to facilitate sleep. (8) Use your bed only for sleep and sex. (9) Take medications as directed.  It is helpful to take prescribed sleeping pills 1 hour before bedtime, so they are causing drowsiness when you lie down, or 10 hours before getting up, to avoid daytime drowsiness. (10) Use a relaxation exercise just before going to sleep -- imagery, massage, warm bath. (11) Keep your feet and hands warm.  Wear warm socks and/or mittens or gloves to bed.  Don't: (1) Exercise just before going to bed. (2) Engage in stimulating activity just before bed, such as playing a competitive game, watching an exciting program on television, or having an important discussion with a loved one. (3) Have caffeine in the evening (coffee, teas, chocolate, sodas, etc.) (4) Read or watch television in bed. (5) Use alcohol to help you sleep. (6) Go to bed too hungry or too full. (7) Take another person's sleeping pills. (8) Take over-the-counter sleeping pills, without your doctor's knowledge.  Tolerance can develop rapidly with these medications.  Diphenhydramine can  have serious side effects for elderly patients. (9) Take daytime naps. (10) Command yourself to go to sleep.  This only makes your mind and body more alert.  If you lie awake for more than 20-30 minutes, get up, go to a different room, participate in a quiet activity (Ex - non-excitable reading or television), and then return to bed when you feel sleepy.  Do this as many times during the night as needed.  This may cause you to have a night or two of poor sleep but it will train your brain to know when it is time for sleep. s

## 2018-06-23 ENCOUNTER — Ambulatory Visit: Payer: BLUE CROSS/BLUE SHIELD | Admitting: Endocrinology

## 2018-06-23 ENCOUNTER — Encounter: Payer: Self-pay | Admitting: Endocrinology

## 2018-06-23 VITALS — BP 110/70 | HR 96 | Ht 65.0 in | Wt 160.0 lb

## 2018-06-23 DIAGNOSIS — E1142 Type 2 diabetes mellitus with diabetic polyneuropathy: Secondary | ICD-10-CM

## 2018-06-23 LAB — POCT GLYCOSYLATED HEMOGLOBIN (HGB A1C): HEMOGLOBIN A1C: 9.7 % — AB (ref 4.0–5.6)

## 2018-06-23 MED ORDER — GLIMEPIRIDE 2 MG PO TABS: 2.0000 mg | ORAL_TABLET | Freq: Every day | ORAL | 3 refills | Status: DC

## 2018-06-23 NOTE — Progress Notes (Signed)
Subjective:    Patient ID: Dana Payne, female    DOB: Oct 24, 1965, 53 y.o.   MRN: [id number]  HPI Pt returns for f/u of diabetes mellitus:  DM type: 2 Dx'ed: 0277 Complications: polyneuropathy Therapy: 3 oral meds GDM: never DKA: never Severe hypoglycemia: never Pancreatitis: never.   Pancreatic imaging: normal on 2012 CT Other: she refuses insulin.   Interval history: no cbg record, but states cbg's vary from 115-130.  pt states she feels well in general.  She did not take farxiga or Jardiance.   Past Medical History:  Diagnosis Date  . Diabetes mellitus   . GERD (gastroesophageal reflux disease)   . HTN (hypertension)   . Hypercholesteremia   . Hypercholesterolemia   . Migraines   . Type II diabetes mellitus ([place])     Past Surgical History:  Procedure Laterality Date  . ABDOMINAL HYSTERECTOMY  2008   Leiomyoma/menorrhagia  . COLONOSCOPY  2012  . COLONOSCOPY N/A 04/21/2017   Procedure: COLONOSCOPY;  Surgeon: Daneil Dolin, MD;  Location: [facility];  Service: Endoscopy;  Laterality: N/A;  2:15pm  . LASER ABLATION OF THE CERVIX     dysplasia    Social History   Socioeconomic History  . Marital status: Single    Spouse name: Not on file  . Number of children: Not on file  . Years of education: Not on file  . Highest education level: Not on file  Occupational History  . Not on file  Social Needs  . Financial resource strain: Not on file  . Food insecurity:    Worry: Not on file    Inability: Not on file  . Transportation needs:    Medical: Not on file    Non-medical: Not on file  Tobacco Use  . Smoking status: Never Smoker  . Smokeless tobacco: Never Used  Substance and Sexual Activity  . Alcohol use: No    Alcohol/week: 0.0 standard drinks  . Drug use: No  . Sexual activity: Yes    Birth control/protection: None    Comment: HYSTERECTOMY-1st intercourse 17 yo-5 partners  Lifestyle  . Physical activity:    Days per week: Not on file   Minutes per session: Not on file  . Stress: Not on file  Relationships  . Social connections:    Talks on phone: Not on file    Gets together: Not on file    Attends religious service: Not on file    Active member of club or organization: Not on file    Attends meetings of clubs or organizations: Not on file    Relationship status: Not on file  . Intimate partner violence:    Fear of current or ex partner: Not on file    Emotionally abused: Not on file    Physically abused: Not on file    Forced sexual activity: Not on file  Other Topics Concern  . 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    Current Outpatient Medications on File Prior to Visit  Medication Sig Dispense Refill  . acetaminophen (TYLENOL) 500 MG tablet Take 500 mg by mouth every 6 (six) hours as needed (for headache.).     Marland Kitchen aspirin EC 81 MG tablet Take 81 mg by mouth daily.    . fluticasone (FLONASE) 50 MCG/ACT nasal spray Place 2 sprays into both nostrils daily. (Patient taking differently: Place 2 sprays into both nostrils daily as needed (for  summer/hay fever allergies). ) 16 g 6  . glucose blood (CONTOUR NEXT TEST) test strip 1 each by Other route daily. And lancets 1/day 100 each 3  . lisinopril-hydrochlorothiazide (PRINZIDE,ZESTORETIC) 20-12.5 MG tablet TAKE 1 TABLET BY MOUTH ONCE DAILY 90 tablet 1  . metFORMIN (GLUCOPHAGE-XR) 500 MG 24 hr tablet TAKE 2 TABLETS BY MOUTH ONCE DAILY WITH BREAKFAST 180 tablet 1  . ONE TOUCH ULTRA TEST test strip USE TO TEST BLOOD SUGAR TWICE DAILY 100 each 2  . Polyethyl Glycol-Propyl Glycol (SYSTANE) 0.4-0.3 % SOLN Place 1-2 drops into both eyes 3 (three) times daily as needed (for dry eyes.).    Marland Kitchen simvastatin (ZOCOR) 40 MG tablet TAKE 1 TABLET BY MOUTH ONCE DAILY AT BEDTIME 90 tablet 1  . sitaGLIPtin (JANUVIA) 100 MG tablet Take 1 tablet (100 mg total) by mouth daily. 90 tablet 3  . zolpidem (AMBIEN) 5 MG tablet  Take 1 tablet (5 mg total) by mouth at bedtime as needed for sleep. 15 tablet 0   No current facility-administered medications on file prior to visit.     No Known Allergies  Family History  Problem Relation Age of Onset  . Colon polyps Maternal Aunt   . Colon polyps Paternal Uncle   . Colon cancer Maternal Aunt        in 65s when diagnosed.   Marland Kitchen Heart disease Father   . Diabetes Father   . Diabetes Mother   . Diabetes Sister   . Diabetes Brother   . Cancer Maternal Aunt        Pancreatic    BP 110/70 (BP Location: Right Arm, Patient Position: Sitting, Cuff Size: Normal)   Pulse 96   Ht 5\' 5"  (1.651 m)   Wt 160 lb (72.6 kg)   SpO2 92%   BMI 26.63 kg/m    Review of Systems She denies hypoglycemia.      Objective:   Physical Exam VITAL SIGNS:  See vs page GENERAL: no distress Pulses: dorsalis pedis intact bilat.   MSK: no deformity of the feet CV: no leg edema Skin:  no ulcer on the feet.  normal color and temp on the feet. Neuro: sensation is intact to touch on the feet  Lab Results  Component Value Date   HGBA1C 9.7 (A) 06/23/2018   Lab Results  Component Value Date   CREATININE 0.75 01/19/2018   BUN 9 01/19/2018   NA 137 01/19/2018   K 4.5 01/19/2018   CL 96 01/19/2018   CO2 30 01/19/2018       Assessment & Plan:  Type 2 DM, with PN: worse Noncompliance with cbg recording and medication: this complicates the rx of DM.  She is not a candidate for repaglinide  Patient Instructions  I have sent a prescription to your pharmacy, to add "glimepiride."   Please continue the same other diabetes medications.  check your blood sugar once a day.  vary the time of day when you check, between before the 3 meals, and at bedtime.  also check if you have symptoms of your blood sugar being too high or too low.  please keep a record of the readings and bring it to your next appointment here (or you can bring the meter itself).  You can write it on any piece of paper.   please call us sooner if your blood sugar goes below 70, or if you have a lot of readings over 200.   Please come back for a follow-up appointment  in 2 months.

## 2018-06-23 NOTE — Patient Instructions (Addendum)
I have sent a prescription to your pharmacy, to add "glimepiride."   Please continue the same other diabetes medications.  check your blood sugar once a day.  vary the time of day when you check, between before the 3 meals, and at bedtime.  also check if you have symptoms of your blood sugar being too high or too low.  please keep a record of the readings and bring it to your next appointment here (or you can bring the meter itself).  You can write it on any piece of paper.  please call us sooner if your blood sugar goes below 70, or if you have a lot of readings over 200.   Please come back for a follow-up appointment in 2 months.

## 2018-06-26 ENCOUNTER — Other Ambulatory Visit: Payer: Self-pay | Admitting: Family Medicine

## 2018-07-10 ENCOUNTER — Other Ambulatory Visit: Payer: Self-pay | Admitting: Family Medicine

## 2018-07-11 ENCOUNTER — Other Ambulatory Visit: Payer: Self-pay | Admitting: General Practice

## 2018-07-11 MED ORDER — LISINOPRIL-HYDROCHLOROTHIAZIDE 20-12.5 MG PO TABS: 1.0000 | ORAL_TABLET | Freq: Every day | ORAL | 0 refills | Status: DC

## 2018-08-25 ENCOUNTER — Ambulatory Visit: Payer: Self-pay | Admitting: Endocrinology

## 2018-09-27 ENCOUNTER — Other Ambulatory Visit: Payer: Self-pay | Admitting: Family Medicine

## 2018-10-02 ENCOUNTER — Encounter: Payer: Self-pay | Admitting: Family Medicine

## 2018-10-02 ENCOUNTER — Ambulatory Visit (INDEPENDENT_AMBULATORY_CARE_PROVIDER_SITE_OTHER): Payer: BC Managed Care – PPO | Admitting: Family Medicine

## 2018-10-02 ENCOUNTER — Encounter: Payer: Self-pay | Admitting: Endocrinology

## 2018-10-02 ENCOUNTER — Ambulatory Visit (INDEPENDENT_AMBULATORY_CARE_PROVIDER_SITE_OTHER): Payer: BC Managed Care – PPO | Admitting: Endocrinology

## 2018-10-02 ENCOUNTER — Other Ambulatory Visit: Payer: Self-pay

## 2018-10-02 VITALS — BP 123/72 | HR 107 | Temp 98.0°F | Resp 17 | Ht 65.0 in | Wt 161.5 lb

## 2018-10-02 VITALS — BP 108/62 | HR 89 | Temp 98.4°F | Wt 161.0 lb

## 2018-10-02 DIAGNOSIS — Z0000 Encounter for general adult medical examination without abnormal findings: Secondary | ICD-10-CM

## 2018-10-02 DIAGNOSIS — Z8639 Personal history of other endocrine, nutritional and metabolic disease: Secondary | ICD-10-CM | POA: Diagnosis not present

## 2018-10-02 DIAGNOSIS — E1142 Type 2 diabetes mellitus with diabetic polyneuropathy: Secondary | ICD-10-CM

## 2018-10-02 DIAGNOSIS — Z9114 Patient's other noncompliance with medication regimen: Secondary | ICD-10-CM

## 2018-10-02 DIAGNOSIS — E785 Hyperlipidemia, unspecified: Secondary | ICD-10-CM

## 2018-10-02 DIAGNOSIS — E049 Nontoxic goiter, unspecified: Secondary | ICD-10-CM | POA: Diagnosis not present

## 2018-10-02 LAB — BASIC METABOLIC PANEL
BUN: 10 mg/dL (ref 6–23)
CO2: 30 mEq/L (ref 19–32)
Calcium: 9.8 mg/dL (ref 8.4–10.5)
Chloride: 97 mEq/L (ref 96–112)
Creatinine, Ser: 0.7 mg/dL (ref 0.40–1.20)
GFR: 105.56 mL/min (ref >60.00)
Glucose, Bld: 219 mg/dL — ABNORMAL HIGH (ref 70–99)
Potassium: 4.3 mEq/L (ref 3.5–5.1)
Sodium: 136 mEq/L (ref 135–145)

## 2018-10-02 LAB — CBC WITH DIFFERENTIAL/PLATELET
Basophils Absolute: 0.1 10*3/uL (ref 0.0–0.1)
Basophils Relative: 0.7 % (ref 0.0–3.0)
Eosinophils Absolute: 0.1 10*3/uL (ref 0.0–0.7)
Eosinophils Relative: 1.8 % (ref 0.0–5.0)
HCT: 43.4 % (ref 36.0–46.0)
Hemoglobin: 14.4 g/dL (ref 12.0–15.0)
Lymphocytes Relative: 30.6 % (ref 12.0–46.0)
Lymphs Abs: 2.3 10*3/uL (ref 0.7–4.0)
MCHC: 33.3 g/dL (ref 30.0–36.0)
MCV: 87.4 fl (ref 78.0–100.0)
Monocytes Absolute: 0.5 10*3/uL (ref 0.1–1.0)
Monocytes Relative: 7.3 % (ref 3.0–12.0)
Neutro Abs: 4.5 10*3/uL (ref 1.4–7.7)
Neutrophils Relative %: 59.6 % (ref 43.0–77.0)
Platelets: 268 10*3/uL (ref 150.0–400.0)
RBC: 4.96 Mil/uL (ref 3.87–5.11)
RDW: 12.3 % (ref 11.5–15.5)
WBC: 7.5 10*3/uL (ref 4.0–10.5)

## 2018-10-02 LAB — LIPID PANEL
Cholesterol: 169 mg/dL (ref 0–200)
HDL: 43.1 mg/dL (ref >39.00)
NonHDL: 125.53
Total CHOL/HDL Ratio: 4
Triglycerides: 225 mg/dL — ABNORMAL HIGH (ref 0.0–149.0)
VLDL: 45 mg/dL — ABNORMAL HIGH (ref 0.0–40.0)

## 2018-10-02 LAB — POCT GLYCOSYLATED HEMOGLOBIN (HGB A1C): Hemoglobin A1C: 11 % — AB (ref 4.0–5.6)

## 2018-10-02 LAB — HEPATIC FUNCTION PANEL
ALT: 34 U/L (ref 0–35)
AST: 23 U/L (ref 0–37)
Albumin: 4.8 g/dL (ref 3.5–5.2)
Alkaline Phosphatase: 84 U/L (ref 39–117)
Bilirubin, Direct: 0.1 mg/dL (ref 0.0–0.3)
Total Bilirubin: 0.7 mg/dL (ref 0.2–1.2)
Total Protein: 7 g/dL (ref 6.0–8.3)

## 2018-10-02 LAB — LDL CHOLESTEROL, DIRECT: Direct LDL: 103 mg/dL

## 2018-10-02 LAB — VITAMIN D 25 HYDROXY (VIT D DEFICIENCY, FRACTURES): VITD: 14.51 ng/mL — ABNORMAL LOW (ref 30.00–100.00)

## 2018-10-02 LAB — TSH: TSH: 0.87 u[IU]/mL (ref 0.35–4.50)

## 2018-10-02 MED ORDER — CANAGLIFLOZIN 300 MG PO TABS: 300.0000 mg | ORAL_TABLET | Freq: Every day | ORAL | 3 refills | Status: DC

## 2018-10-02 MED ORDER — GLIMEPIRIDE 4 MG PO TABS: 4.0000 mg | ORAL_TABLET | Freq: Every day | ORAL | 3 refills | Status: DC

## 2018-10-02 MED ORDER — SITAGLIPTIN PHOSPHATE 25 MG PO TABS: 25.0000 mg | ORAL_TABLET | Freq: Every day | ORAL | 3 refills | Status: DC

## 2018-10-02 MED ORDER — METFORMIN HCL ER 500 MG PO TB24: 1000.0000 mg | ORAL_TABLET | ORAL | 3 refills | Status: DC

## 2018-10-02 NOTE — Patient Instructions (Signed)
Follow up in 6 months to recheck cholesterol We'll notify you of your lab results and make any changes if needed Continue to work on healthy diet and regular exercise- you can do it! We'll call you with your Thyroid US appt Don't forget to schedule mammogram and eye exam! Call with any questions or concerns Stay Safe!!!

## 2018-10-02 NOTE — Progress Notes (Signed)
   Subjective:    Patient ID: Dana Payne, female    DOB: 1965-10-17, 53 y.o.   MRN: [id number]  HPI CPE- due for mammo (pt to schedule), colonoscopy.  UTD on immunizations.   Review of Systems Patient reports no vision/ hearing changes, adenopathy,fever, weight change,  persistant/recurrent hoarseness , swallowing issues, chest pain, palpitations, edema, persistant/recurrent cough, hemoptysis, dyspnea (rest/exertional/paroxysmal nocturnal), gastrointestinal bleeding (melena, rectal bleeding), abdominal pain, significant heartburn, bowel changes, GU symptoms (dysuria, hematuria, incontinence), Gyn symptoms (abnormal  bleeding, pain),  syncope, focal weakness, memory loss, numbness & tingling, skin/hair/nail changes, abnormal bruising or bleeding, anxiety, or depression.     Objective:   Physical Exam General Appearance:    Alert, cooperative, no distress, appears stated age  Head:    Normocephalic, without obvious abnormality, atraumatic  Eyes:    PERRL, conjunctiva/corneas clear, EOM's intact, fundi    benign, both eyes  Ears:    Normal TM's and external ear canals, both ears  Nose:   Nares normal, septum midline, mucosa normal, no drainage    or sinus tenderness  Throat:   Lips, mucosa, and tongue normal; teeth and gums normal  Neck:   Supple, symmetrical, trachea midline, no adenopathy;    Thyroid: thyromegaly  Back:     Symmetric, no curvature, ROM normal, no CVA tenderness  Lungs:     Clear to auscultation bilaterally, respirations unlabored  Chest Wall:    No tenderness or deformity   Heart:    Regular rate and rhythm, S1 and S2 normal, no murmur, rub   or gallop  Breast Exam:    Deferred to GYN  Abdomen:     Soft, non-tender, bowel sounds active all four quadrants,    no masses, no organomegaly  Genitalia:    Deferred to GYN  Rectal:    Extremities:   Extremities normal, atraumatic, no cyanosis or edema  Pulses:   2+ and symmetric all extremities  Skin:   Skin color,  texture, turgor normal, no rashes or lesions  Lymph nodes:   Cervical, supraclavicular, and axillary nodes normal  Neurologic:   CNII-XII intact, normal strength, sensation and reflexes    throughout          Assessment & Plan:

## 2018-10-02 NOTE — Assessment & Plan Note (Signed)
Chronic problem.  Due for repeat US.  [place] ordered

## 2018-10-02 NOTE — Patient Instructions (Addendum)
I have sent a prescription to your pharmacy, to resume [place] and invokana.  Please continue the same other diabetes medications.   Please see Vaughan Basta the same day, to learn about the insulin pen, just in case.   check your blood sugar once a day.  vary the time of day when you check, between before the 3 meals, and at bedtime.  also check if you have symptoms of your blood sugar being too high or too low.  please keep a record of the readings and bring it to your next appointment here (or you can bring the meter itself).  You can write it on any piece of paper.  please call us sooner if your blood sugar goes below 70, or if you have a lot of readings over 200.   Please come back for a follow-up appointment in 2 months.

## 2018-10-02 NOTE — Assessment & Plan Note (Signed)
Chronic problem, taking Simvastatin daily.  Check labs.  Adjust meds prn

## 2018-10-02 NOTE — Assessment & Plan Note (Signed)
Pt's PE WNL w/ exception of known thyromegaly.  Pt to call and schedule mammo and eye exam.  Check labs.  Anticipatory guidance provided.

## 2018-10-02 NOTE — Progress Notes (Signed)
Subjective:    Patient ID: Dana Payne, female    DOB: July 30, 1965, 53 y.o.   MRN: [id number]  HPI Pt returns for f/u of diabetes mellitus:  DM type: 2 Dx'ed: 8469 Complications: polyneuropathy.   Therapy: 3 oral meds GDM: never DKA: never Severe hypoglycemia: never Pancreatitis: never.   Pancreatic imaging: normal on 2012 CT.   Other: she refuses insulin.   Interval history: no cbg record, but states cbg's vary from 115-278.  There is no trend throughout the day.  pt states she feels well in general.  Specifically, she denies n/v/sob.  She stopped [place], due to cost.  However, she has regained her insurance.   Past Medical History:  Diagnosis Date  . Diabetes mellitus   . GERD (gastroesophageal reflux disease)   . HTN (hypertension)   . Hypercholesteremia   . Hypercholesterolemia   . Migraines   . Type II diabetes mellitus ([place])     Past Surgical History:  Procedure Laterality Date  . ABDOMINAL HYSTERECTOMY  2008   Leiomyoma/menorrhagia  . COLONOSCOPY  2012  . COLONOSCOPY N/A 04/21/2017   Procedure: COLONOSCOPY;  Surgeon: Daneil Dolin, MD;  Location: [facility];  Service: Endoscopy;  Laterality: N/A;  2:15pm  . LASER ABLATION OF THE CERVIX     dysplasia    Social History   Socioeconomic History  . Marital status: Single    Spouse name: Not on file  . Number of children: Not on file  . Years of education: Not on file  . Highest education level: Not on file  Occupational History  . Not on file  Social Needs  . Financial resource strain: Not on file  . Food insecurity:    Worry: Not on file    Inability: Not on file  . Transportation needs:    Medical: Not on file    Non-medical: Not on file  Tobacco Use  . Smoking status: Never Smoker  . Smokeless tobacco: Never Used  Substance and Sexual Activity  . Alcohol use: No    Alcohol/week: 0.0 standard drinks  . Drug use: No  . Sexual activity: Yes    Birth control/protection: None    Comment:  HYSTERECTOMY-1st intercourse 47 yo-5 partners  Lifestyle  . Physical activity:    Days per week: Not on file    Minutes per session: Not on file  . Stress: Not on file  Relationships  . Social connections:    Talks on phone: Not on file    Gets together: Not on file    Attends religious service: Not on file    Active member of club or organization: Not on file    Attends meetings of clubs or organizations: Not on file    Relationship status: Not on file  . Intimate partner violence:    Fear of current or ex partner: Not on file    Emotionally abused: Not on file    Physically abused: Not on file    Forced sexual activity: Not on file  Other Topics Concern  . 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    Current Outpatient Medications on File Prior to Visit  Medication Sig Dispense Refill  . acetaminophen (TYLENOL) 500 MG tablet Take 500 mg by mouth every 6 (six) hours as needed (for headache.).     Marland Kitchen aspirin EC 81 MG tablet Take 81 mg by mouth daily.    . fluticasone (  FLONASE) 50 MCG/ACT nasal spray Place 2 sprays into both nostrils daily. (Patient taking differently: Place 2 sprays into both nostrils daily as needed (for summer/hay fever allergies). ) 16 g 6  . glucose blood (CONTOUR NEXT TEST) test strip 1 each by Other route daily. And lancets 1/day 100 each 3  . lisinopril-hydrochlorothiazide (PRINZIDE,ZESTORETIC) 20-12.5 MG tablet Take 1 tablet by mouth daily. 90 tablet 0  . ONE TOUCH ULTRA TEST test strip USE TO TEST BLOOD SUGAR TWICE DAILY 100 each 2  . Polyethyl Glycol-Propyl Glycol (SYSTANE) 0.4-0.3 % SOLN Place 1-2 drops into both eyes 3 (three) times daily as needed (for dry eyes.).    Marland Kitchen simvastatin (ZOCOR) 40 MG tablet TAKE 1 TABLET BY MOUTH AT BEDTIME (Patient not taking: Reported on 10/02/2018) 90 tablet 0  . sitaGLIPtin (JANUVIA) 100 MG tablet Take 1 tablet (100 mg total) by mouth daily. 90 tablet 3  .  zolpidem (AMBIEN) 5 MG tablet Take 1 tablet (5 mg total) by mouth at bedtime as needed for sleep. 15 tablet 0   No current facility-administered medications on file prior to visit.     No Known Allergies  Family History  Problem Relation Age of Onset  . Colon polyps Maternal Aunt   . Colon polyps Paternal Uncle   . Colon cancer Maternal Aunt        in 50s when diagnosed.   Marland Kitchen Heart disease Father   . Diabetes Father   . Diabetes Mother   . Diabetes Sister   . Diabetes Brother   . Cancer Maternal Aunt        Pancreatic    BP 108/62 (BP Location: Right Arm, Patient Position: Sitting, Cuff Size: Normal)   Pulse 89   Temp 98.4 F (36.9 C) (Oral)   Wt 161 lb (73 kg)   SpO2 93%   BMI 26.79 kg/m    Review of Systems She denies hypoglycemia and weight change.      Objective:   Physical Exam VITAL SIGNS:  See vs page GENERAL: no distress Pulses: dorsalis pedis intact bilat.   MSK: no deformity of the feet CV: no leg edema.   Skin:  no ulcer on the feet.  normal color and temp on the feet.   Neuro: sensation is intact to touch on the feet.      Lab Results  Component Value Date   HGBA1C 11.0 (A) 10/02/2018   Lab Results  Component Value Date   CREATININE 0.75 01/19/2018   BUN 9 01/19/2018   NA 137 01/19/2018   K 4.5 01/19/2018   CL 96 01/19/2018   CO2 30 01/19/2018       Assessment & Plan:  Type 2 DM, with PN: worse.   Noncompliance with medication, and with advice to take insulin.  We discussed risks.   Patient Instructions  I have sent a prescription to your pharmacy, to resume [place] and invokana.  Please continue the same other diabetes medications.   Please see Vaughan Basta the same day, to learn about the insulin pen, just in case.   check your blood sugar once a day.  vary the time of day when you check, between before the 3 meals, and at bedtime.  also check if you have symptoms of your blood sugar being too high or too low.  please keep a record of the  readings and bring it to your next appointment here (or you can bring the meter itself).  You can write it on any  piece of paper.  please call us sooner if your blood sugar goes below 70, or if you have a lot of readings over 200.   Please come back for a follow-up appointment in 2 months.

## 2018-10-02 NOTE — Assessment & Plan Note (Signed)
Pt has hx of low Vit D.  Check labs and replete prn. 

## 2018-10-03 ENCOUNTER — Other Ambulatory Visit: Payer: Self-pay | Admitting: General Practice

## 2018-10-03 MED ORDER — VITAMIN D (ERGOCALCIFEROL) 1.25 MG (50000 UNIT) PO CAPS: 50000.0000 [IU] | ORAL_CAPSULE | ORAL | 0 refills | Status: DC

## 2018-10-13 ENCOUNTER — Other Ambulatory Visit: Payer: Self-pay

## 2018-10-13 ENCOUNTER — Ambulatory Visit (HOSPITAL_COMMUNITY)
Admission: RE | Admit: 2018-10-13 | Discharge: 2018-10-13 | Disposition: A | Discharge status: Home / Self Care | Payer: BC Managed Care – PPO | Source: Ambulatory Visit | Attending: Family Medicine | Admitting: Family Medicine

## 2018-10-13 DIAGNOSIS — E049 Nontoxic goiter, unspecified: Secondary | ICD-10-CM | POA: Insufficient documentation

## 2018-10-13 DIAGNOSIS — E042 Nontoxic multinodular goiter: Secondary | ICD-10-CM | POA: Diagnosis not present

## 2018-10-16 ENCOUNTER — Other Ambulatory Visit: Payer: Self-pay | Admitting: Family Medicine

## 2018-10-16 NOTE — Progress Notes (Signed)
Called pt and lmovm to return call.    Ok for PEC to Discuss results / PCP recommendations / Schedule patient.  

## 2018-10-17 NOTE — Progress Notes (Signed)
Called pt and lmovm to return call.    Ok for PEC to Discuss results / PCP recommendations / Schedule patient.  

## 2018-10-19 NOTE — Progress Notes (Signed)
Called pt and lmovm to return call.

## 2018-10-20 ENCOUNTER — Encounter: Payer: Self-pay | Admitting: General Practice

## 2018-12-01 DIAGNOSIS — Z1231 Encounter for screening mammogram for malignant neoplasm of breast: Secondary | ICD-10-CM | POA: Diagnosis not present

## 2018-12-01 LAB — HM MAMMOGRAPHY

## 2018-12-19 ENCOUNTER — Encounter: Payer: Self-pay | Admitting: General Practice

## 2018-12-20 ENCOUNTER — Other Ambulatory Visit: Payer: Self-pay | Admitting: Family Medicine

## 2018-12-27 ENCOUNTER — Other Ambulatory Visit: Payer: Self-pay

## 2018-12-29 ENCOUNTER — Encounter: Payer: Self-pay | Admitting: Gynecology

## 2018-12-29 ENCOUNTER — Other Ambulatory Visit: Payer: Self-pay

## 2018-12-29 ENCOUNTER — Ambulatory Visit: Payer: BC Managed Care – PPO | Admitting: Gynecology

## 2018-12-29 ENCOUNTER — Ambulatory Visit: Payer: BC Managed Care – PPO | Admitting: Endocrinology

## 2018-12-29 ENCOUNTER — Encounter: Payer: Self-pay | Admitting: Endocrinology

## 2018-12-29 VITALS — BP 118/76 | Ht 65.0 in | Wt 162.0 lb

## 2018-12-29 VITALS — BP 100/62 | HR 81 | Temp 98.8°F | Ht 65.0 in | Wt 162.2 lb

## 2018-12-29 DIAGNOSIS — E1142 Type 2 diabetes mellitus with diabetic polyneuropathy: Secondary | ICD-10-CM

## 2018-12-29 DIAGNOSIS — N952 Postmenopausal atrophic vaginitis: Secondary | ICD-10-CM

## 2018-12-29 DIAGNOSIS — Z01419 Encounter for gynecological examination (general) (routine) without abnormal findings: Secondary | ICD-10-CM | POA: Diagnosis not present

## 2018-12-29 LAB — POCT GLYCOSYLATED HEMOGLOBIN (HGB A1C): Hemoglobin A1C: 8.8 % — AB (ref 4.0–5.6)

## 2018-12-29 MED ORDER — SITAGLIPTIN PHOSPHATE 100 MG PO TABS: 100.0000 mg | ORAL_TABLET | Freq: Every day | ORAL | 3 refills | Status: DC

## 2018-12-29 MED ORDER — LISINOPRIL-HYDROCHLOROTHIAZIDE 20-12.5 MG PO TABS: 0.5000 | ORAL_TABLET | Freq: Every day | ORAL | 0 refills | Status: DC

## 2018-12-29 MED ORDER — CANAGLIFLOZIN 300 MG PO TABS: 300.0000 mg | ORAL_TABLET | Freq: Every day | ORAL | 11 refills | Status: DC

## 2018-12-29 MED ORDER — CONTOUR NEXT TEST VI STRP: 1.0000 | ORAL_STRIP | Freq: Every day | 3 refills | Status: AC

## 2018-12-29 NOTE — Progress Notes (Signed)
Subjective:    Patient ID: Dana Payne, female    DOB: April 14, 1966, 53 y.o.   MRN: [id number]  HPI Pt returns for f/u of diabetes mellitus:  DM type: 2 Dx'ed: 123456 Complications: polyneuropathy.   Therapy: 3 oral meds GDM: never DKA: never Severe hypoglycemia: never Pancreatitis: never.   Pancreatic imaging: normal on 2012 CT.   Other: she refuses insulin.   Interval history: no cbg record, but states cbg's vary from 132-268.  pt states she feels well in general.  She reports she takes meds as rx'ed.   Past Medical History:  Diagnosis Date  . Diabetes mellitus   . GERD (gastroesophageal reflux disease)   . HTN (hypertension)   . Hypercholesteremia   . Hypercholesterolemia   . Migraines   . Type II diabetes mellitus ([place])     Past Surgical History:  Procedure Laterality Date  . ABDOMINAL HYSTERECTOMY  2008   Leiomyoma/menorrhagia  . COLONOSCOPY  2012  . COLONOSCOPY N/A 04/21/2017   Procedure: COLONOSCOPY;  Surgeon: Daneil Dolin, MD;  Location: [facility];  Service: Endoscopy;  Laterality: N/A;  2:15pm  . LASER ABLATION OF THE CERVIX     dysplasia    Social History   Socioeconomic History  . Marital status: Single    Spouse name: Not on file  . Number of children: Not on file  . Years of education: Not on file  . Highest education level: Not on file  Occupational History  . Not on file  Social Needs  . Financial resource strain: Not on file  . Food insecurity    Worry: Not on file    Inability: Not on file  . Transportation needs    Medical: Not on file    Non-medical: Not on file  Tobacco Use  . Smoking status: Never Smoker  . Smokeless tobacco: Never Used  Substance and Sexual Activity  . Alcohol use: No    Alcohol/week: 0.0 standard drinks  . Drug use: No  . Sexual activity: Yes    Birth control/protection: None    Comment: HYSTERECTOMY-1st intercourse 48 yo-5 partners  Lifestyle  . Physical activity    Days per week: Not on file   Minutes per session: Not on file  . Stress: Not on file  Relationships  . Social Herbalist on phone: Not on file    Gets together: Not on file    Attends religious service: Not on file    Active member of club or organization: Not on file    Attends meetings of clubs or organizations: Not on file    Relationship status: Not on file  . Intimate partner violence    Fear of current or ex partner: Not on file    Emotionally abused: Not on file    Physically abused: Not on file    Forced sexual activity: Not on file  Other Topics Concern  . Not on file  Social History Narrative   SINGLE, NO KIDS. 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    Current Outpatient Medications on File Prior to Visit  Medication Sig Dispense Refill  . acetaminophen (TYLENOL) 500 MG tablet Take 500 mg by mouth every 6 (six) hours as needed (for headache.).     Marland Kitchen aspirin EC 81 MG tablet Take 81 mg by mouth daily.    . fluticasone (FLONASE) 50 MCG/ACT nasal spray Place 2 sprays into both nostrils daily. (Patient taking differently: Place 2 sprays into both nostrils  daily as needed (for summer/hay fever allergies). ) 16 g 6  . glimepiride (AMARYL) 4 MG tablet Take 1 tablet (4 mg total) by mouth daily before breakfast. 90 tablet 3  . metFORMIN (GLUCOPHAGE-XR) 500 MG 24 hr tablet Take 2 tablets (1,000 mg total) by mouth every morning. 180 tablet 3  . Polyethyl Glycol-Propyl Glycol (SYSTANE) 0.4-0.3 % SOLN Place 1-2 drops into both eyes 3 (three) times daily as needed (for dry eyes.).    Marland Kitchen simvastatin (ZOCOR) 40 MG tablet TAKE 1 TABLET BY MOUTH AT BEDTIME 90 tablet 0  . zolpidem (AMBIEN) 5 MG tablet Take 1 tablet (5 mg total) by mouth at bedtime as needed for sleep. 15 tablet 0  . Vitamin D, Ergocalciferol, (DRISDOL) 1.25 MG (50000 UT) CAPS capsule Take 1 capsule (50,000 Units total) by mouth every 7 (seven) days. (Patient not taking: Reported on 12/29/2018) 12 capsule 0   No current  facility-administered medications on file prior to visit.     No Known Allergies  Family History  Problem Relation Age of Onset  . Colon polyps Maternal Aunt   . Colon polyps Paternal Uncle   . Colon cancer Maternal Aunt        in 16s when diagnosed.   Marland Kitchen Heart disease Father   . Diabetes Father   . Diabetes Mother   . Diabetes Sister   . Diabetes Brother   . Heart disease Brother   . Cancer Maternal Aunt        Pancreatic    BP 100/62   Pulse 81   Temp 98.8 F (37.1 C) (Oral)   Ht 5\' 5"  (1.651 m)   Wt 162 lb 3.2 oz (73.6 kg)   SpO2 96%   BMI 26.99 kg/m    Review of Systems She denies hypoglycemia.      Objective:   Physical Exam VITAL SIGNS:  See vs page GENERAL: no distress Pulses: dorsalis pedis intact bilat.   MSK: no deformity of the feet CV: no leg edema Skin:  no ulcer on the feet.  normal color and temp on the feet.  Neuro: sensation is intact to touch on the feet.    Lab Results  Component Value Date   HGBA1C 8.8 (A) 12/29/2018   Lab Results  Component Value Date   CREATININE 0.70 10/02/2018   BUN 10 10/02/2018   NA 136 10/02/2018   K 4.3 10/02/2018   CL 97 10/02/2018   CO2 30 10/02/2018       Assessment & Plan:  Type 2 DM, with PN: she needs increased rx HTN, new to me: overcontrolled.  This could be worsened by invokana.  Dr Birdie Riddle will recheck this soon, and she may need to re-increase zestoretic.   Patient Instructions  I have sent a prescription to your pharmacy, to add "Invokana."  Because of an interaction, please reduce the lisinopril-HCT to 1/2 pill per day, until you see Dr Birdie Riddle.   Please continue the same other diabetes medications.   check your blood sugar once a day.  vary the time of day when you check, between before the 3 meals, and at bedtime.  also check if you have symptoms of your blood sugar being too high or too low.  please keep a record of the readings and bring it to your next appointment here (or you can bring the  meter itself).  You can write it on any piece of paper.  please call us sooner if your blood sugar goes  below 70, or if you have a lot of readings over 200.   Please come back for a follow-up appointment in 2 months.

## 2018-12-29 NOTE — Patient Instructions (Addendum)
I have sent a prescription to your pharmacy, to add "Invokana."  Because of an interaction, please reduce the lisinopril-HCT to 1/2 pill per day, until you see Dr Birdie Riddle.   Please continue the same other diabetes medications.   check your blood sugar once a day.  vary the time of day when you check, between before the 3 meals, and at bedtime.  also check if you have symptoms of your blood sugar being too high or too low.  please keep a record of the readings and bring it to your next appointment here (or you can bring the meter itself).  You can write it on any piece of paper.  please call us sooner if your blood sugar goes below 70, or if you have a lot of readings over 200.   Please come back for a follow-up appointment in 2 months.

## 2018-12-29 NOTE — Progress Notes (Signed)
    Normani Borntreger 07-24-1965 [id number]        53 y.o.  G0P0 for annual gynecologic exam.  Without gynecologic complaints.  Had an episode of vaginal spotting last year that was evaluated with no source found.  Has not had any bleeding since.  Past medical history,surgical history, problem list, medications, allergies, family history and social history were all reviewed and documented as reviewed in the EPIC chart.  ROS:  Performed with pertinent positives and negatives included in the history, assessment and plan.   Additional significant findings : None   Exam: Caryn Bee assistant Vitals:   12/29/18 1004  BP: 118/76  Weight: 162 lb (73.5 kg)  Height: 5\' 5"  (1.651 m)   Body mass index is 26.96 kg/m.  General appearance:  Normal affect, orientation and appearance. Skin: Grossly normal HEENT: Without gross lesions.  No cervical or supraclavicular adenopathy. Thyroid normal.  Lungs:  Clear without wheezing, rales or rhonchi Cardiac: RR, without RMG Abdominal:  Soft, nontender, without masses, guarding, rebound, organomegaly or hernia Breasts:  Examined lying and sitting without masses, retractions, discharge or axillary adenopathy. Pelvic:  Ext, BUS, Vagina: With atrophic changes  Adnexa: Without masses or tenderness    Anus and perineum: Normal   Rectovaginal: Normal sphincter tone without palpated masses or tenderness.    Assessment/Plan:  53 y.o. G0P0 female for annual gynecologic exam.   1. Postmenopausal.  No significant menopausal symptoms.  Status post TAH for leiomyoma 2008 2. Mammography 11/2018.  Continue with annual mammography next year.  Breast exam normal today. 3. Colonoscopy 2018.  Repeat at their recommended interval. 4. Pap smear 2019.  No Pap smear done today.  History of laser vaporization of the cervix a number of years ago for unknown grade of dysplasia.  Plan repeat Pap smear at 3-year interval. 5. DEXA never.  Will plan further into the menopause. 6.  Health maintenance.  No routine lab work done as patient does this elsewhere.  Follow-up 1 year, sooner as needed.   Anastasio Auerbach MD, 10:23 AM 12/29/2018

## 2018-12-29 NOTE — Patient Instructions (Signed)
Follow-up in 1 year for annual exam, sooner if any issues. 

## 2019-01-06 ENCOUNTER — Other Ambulatory Visit: Payer: Self-pay | Admitting: Family Medicine

## 2019-01-11 ENCOUNTER — Telehealth: Payer: Self-pay | Admitting: Endocrinology

## 2019-01-11 NOTE — Telephone Encounter (Signed)
Patient called re: Please resend the following RX (showing as a No print):  MEDICATION: lisinopril-hydrochlorothiazide (ZESTORETIC) 20-12.5 MG tablet  PHARMACY:   [facility], [place]. [phone] (Phone) [phone] (Fax)    IS THIS A 90 DAY SUPPLY : Yes  IS PATIENT OUT OF MEDICATION: NO  IF NOT; HOW MUCH IS LEFT: 2  LAST APPOINTMENT DATE: @9 /07/2018  NEXT APPOINTMENT DATE:@11 /13/2020  DO WE HAVE YOUR PERMISSION TO LEAVE A DETAILED MESSAGE: Yes  OTHER COMMENTS:    **Let patient know to contact pharmacy at the end of the day to make sure medication is ready. **  ** Please notify patient to allow 48-72 hours to process**  **Encourage patient to contact the pharmacy for refills or they can request refills through [facility]**

## 2019-01-12 ENCOUNTER — Other Ambulatory Visit: Payer: Self-pay

## 2019-01-12 DIAGNOSIS — E1142 Type 2 diabetes mellitus with diabetic polyneuropathy: Secondary | ICD-10-CM

## 2019-01-12 DIAGNOSIS — I10 Essential (primary) hypertension: Secondary | ICD-10-CM

## 2019-01-12 MED ORDER — LISINOPRIL-HYDROCHLOROTHIAZIDE 20-12.5 MG PO TABS: 0.5000 | ORAL_TABLET | Freq: Every day | ORAL | 0 refills | Status: DC

## 2019-01-12 NOTE — Telephone Encounter (Signed)
canagliflozin (INVOKANA) 300 MG TABS tablet 30 tablet 11 12/29/2018    Sig - Route: Take 1 tablet (300 mg total) by mouth daily before breakfast. - Oral   Sent to pharmacy as: canagliflozin (INVOKANA) 300 MG Tab tablet   E-Prescribing Status: Receipt confirmed by pharmacy (12/29/2018 1:18 PM EDT)

## 2019-01-17 ENCOUNTER — Encounter: Payer: Self-pay | Admitting: Gynecology

## 2019-02-05 ENCOUNTER — Other Ambulatory Visit: Payer: Self-pay

## 2019-02-05 ENCOUNTER — Encounter: Payer: Self-pay | Admitting: Family Medicine

## 2019-02-05 ENCOUNTER — Ambulatory Visit: Payer: BC Managed Care – PPO | Admitting: Family Medicine

## 2019-02-05 ENCOUNTER — Ambulatory Visit (INDEPENDENT_AMBULATORY_CARE_PROVIDER_SITE_OTHER): Payer: BC Managed Care – PPO | Admitting: Family Medicine

## 2019-02-05 DIAGNOSIS — Z20822 Contact with and (suspected) exposure to covid-19: Secondary | ICD-10-CM

## 2019-02-05 DIAGNOSIS — Z20828 Contact with and (suspected) exposure to other viral communicable diseases: Secondary | ICD-10-CM | POA: Diagnosis not present

## 2019-02-05 DIAGNOSIS — R197 Diarrhea, unspecified: Secondary | ICD-10-CM | POA: Diagnosis not present

## 2019-02-05 NOTE — Progress Notes (Signed)
Virtual Visit via Video   I connected with patient on 02/05/19 at  9:30 AM EDT by a video enabled telemedicine application and verified that I am speaking with the correct person using two identifiers.  Location patient: Home Location provider: Acupuncturist, Office Persons participating in the virtual visit: Patient, Provider, [place] (Jess B)  I discussed the limitations of evaluation and management by telemedicine and the availability of in person appointments. The patient expressed understanding and agreed to proceed.  Interactive audio and video telecommunications were attempted between this provider and patient, however failed, due to patient having technical difficulties OR patient did not have access to video capability.  We continued and completed visit with audio only.   Subjective:   HPI:   Diarrhea- sxs started mid-week last week, ~5 days of sxs.  No blood in stool.  Stools are loose.  No fevers.  + body aches.  + HA.  + sick contacts.  + 'exhausted'  + COVID contacts at work.  No medication changes.  Denies abd pain.  No N/V.  Denies changes to smell or taste.  ROS:   See pertinent positives and negatives per HPI.  Patient Active Problem List   Diagnosis Date Noted  . H/O vitamin D deficiency 03/26/2016  . Insomnia 03/26/2016  . Physical exam 11/07/2014  . Elevated serum creatinine 04/07/2012  . Chest pain 11/11/2011  . Anxiety and depression 02/25/2011  . Hemangioma of liver 11/09/2010  . Rectal bleeding 10/01/2010  . Goiter 02/25/2010  . DM type 2 with diabetic peripheral neuropathy ([place]) 05/16/2009  . Hyperlipidemia 05/16/2009  . COUGH, CHRONIC 03/19/2009  . Allergic rhinitis 10/03/2008  . COMMON MIGRAINE 08/08/2008  . Essential hypertension 08/08/2008  . UNSPECIFIED CIRCULATORY SYSTEM DISORDER 08/08/2008    Social History   Tobacco Use  . Smoking status: Never Smoker  . Smokeless tobacco: Never Used  Substance Use Topics  . Alcohol use: No   Alcohol/week: 0.0 standard drinks    Current Outpatient Medications:  .  acetaminophen (TYLENOL) 500 MG tablet, Take 500 mg by mouth every 6 (six) hours as needed (for headache.). , Disp: , Rfl:  .  aspirin EC 81 MG tablet, Take 81 mg by mouth daily., Disp: , Rfl:  .  fluticasone (FLONASE) 50 MCG/ACT nasal spray, Place 2 sprays into both nostrils daily. (Patient taking differently: Place 2 sprays into both nostrils daily as needed (for summer/hay fever allergies). ), Disp: 16 g, Rfl: 6 .  glimepiride (AMARYL) 4 MG tablet, Take 1 tablet (4 mg total) by mouth daily before breakfast., Disp: 90 tablet, Rfl: 3 .  glucose blood (CONTOUR NEXT TEST) test strip, 1 each by Other route daily. And lancets 1/day, Disp: 100 each, Rfl: 3 .  lisinopril-hydrochlorothiazide (ZESTORETIC) 20-12.5 MG tablet, Take 0.5 tablets by mouth daily., Disp: 45 tablet, Rfl: 0 .  metFORMIN (GLUCOPHAGE-XR) 500 MG 24 hr tablet, Take 2 tablets (1,000 mg total) by mouth every morning., Disp: 180 tablet, Rfl: 3 .  Polyethyl Glycol-Propyl Glycol (SYSTANE) 0.4-0.3 % SOLN, Place 1-2 drops into both eyes 3 (three) times daily as needed (for dry eyes.)., Disp: , Rfl:  .  simvastatin (ZOCOR) 40 MG tablet, TAKE 1 TABLET BY MOUTH AT BEDTIME, Disp: 90 tablet, Rfl: 0 .  sitaGLIPtin (JANUVIA) 100 MG tablet, Take 1 tablet (100 mg total) by mouth daily., Disp: 90 tablet, Rfl: 3 .  canagliflozin (INVOKANA) 300 MG TABS tablet, Take 1 tablet (300 mg total) by mouth daily before breakfast. (Patient not  taking: Reported on 02/05/2019), Disp: 30 tablet, Rfl: 11 .  Vitamin D, Ergocalciferol, (DRISDOL) 1.25 MG (50000 UT) CAPS capsule, Take 1 capsule (50,000 Units total) by mouth every 7 (seven) days. (Patient not taking: Reported on 12/29/2018), Disp: 12 capsule, Rfl: 0 .  zolpidem (AMBIEN) 5 MG tablet, Take 1 tablet (5 mg total) by mouth at bedtime as needed for sleep. (Patient not taking: Reported on 02/05/2019), Disp: 15 tablet, Rfl: 0  No Known Allergies   Objective:   There were no vitals taken for this visit. Pt is able to speak clearly, coherently without shortness of breath or increased work of breathing but voice if raspy and sounds fatigued. Thought process is linear.  Mood is appropriate.   Assessment and Plan:   Diarrhea- new.  Pt has had sxs for ~5 days.  Associated w/ fatigue, body aches, headache.  Denies cough, SOB, changes to taste or smell.  Known COVID exposures at work.  Strongly encouraged her to get tested and remain out of work until sxs resolve.  Note provided for work.  Reviewed supportive care and red flags that should prompt return.  Pt expressed understanding and is in agreement w/ plan.    Annye Asa, MD 02/05/2019

## 2019-02-06 LAB — NOVEL CORONAVIRUS, NAA: SARS-CoV-2, NAA: NOT DETECTED

## 2019-02-09 ENCOUNTER — Telehealth: Payer: Self-pay | Admitting: Family Medicine

## 2019-02-09 NOTE — Telephone Encounter (Signed)
°  Neg COVID results given to pt

## 2019-02-12 ENCOUNTER — Telehealth: Payer: Self-pay | Admitting: *Deleted

## 2019-02-12 NOTE — Telephone Encounter (Signed)
This is Dr Dana Payne pt and will send request to [place] [facility] [place].

## 2019-02-12 NOTE — Telephone Encounter (Signed)
Noted, will mail results when received.

## 2019-02-12 NOTE — Telephone Encounter (Signed)
Pt called stating that she would like to cancel the request to have her COVID faxed; she would like to them mailed to [address] [place] [address]; explained that results may take up to 7 days to be received if mailed; she verbalized understanding.

## 2019-02-14 ENCOUNTER — Telehealth: Payer: Self-pay | Admitting: Family Medicine

## 2019-02-14 NOTE — Telephone Encounter (Signed)
Called pt and LMOVM to return call.  °

## 2019-02-14 NOTE — Telephone Encounter (Signed)
Pt called in asking to speak to Dana Payne in regards to a call she got from [facility]. That is all she would say. Pt can be reached at the home # and would like a call back ASAP.

## 2019-02-15 NOTE — Telephone Encounter (Signed)
Called pt again, closing encounter until call is returned.

## 2019-02-20 DIAGNOSIS — H524 Presbyopia: Secondary | ICD-10-CM | POA: Diagnosis not present

## 2019-02-20 DIAGNOSIS — H5213 Myopia, bilateral: Secondary | ICD-10-CM | POA: Diagnosis not present

## 2019-02-20 DIAGNOSIS — E119 Type 2 diabetes mellitus without complications: Secondary | ICD-10-CM | POA: Diagnosis not present

## 2019-02-20 DIAGNOSIS — H52223 Regular astigmatism, bilateral: Secondary | ICD-10-CM | POA: Diagnosis not present

## 2019-02-20 DIAGNOSIS — H33312 Horseshoe tear of retina without detachment, left eye: Secondary | ICD-10-CM | POA: Diagnosis not present

## 2019-02-22 NOTE — Progress Notes (Signed)
[facility] Note  02/27/2019     CHIEF COMPLAINT Patient presents for Retina Evaluation   HISTORY OF PRESENT ILLNESS: Dana Payne is a 53 y.o. female who presents to the clinic today for:   HPI    Retina Evaluation    In left eye.  Onset: Unknown.  Duration: Unknown.  Associated Symptoms Floaters.  Context:  distance vision, mid-range vision and near vision.  Treatments tried include artificial tears.  Response to treatment was no improvement.  I, the attending physician,  performed the HPI with the patient and updated documentation appropriately.          Comments    53 y/o female pt referred by Dr. Barbra Sarks at Dr. Waynard Edwards office.  Saw Dr. Barbra Sarks on 10.27.20.  Pt referred or possible horseshoe tear OS.  Pt was there for routine eye exam.  Pt has had small floaters OS for several months, but didn't think it was anything unusual.. Pt very myopic.  VA good OU cc.  Denies pain, flashes.  Systane prn OU.  BS unknown, but last A1C was an "8 or 9."       Last edited by Bernarda Caffey, MD on 03/01/2019 11:23 AM. (History)    pt is here on the referral of Dr. Barbra Sarks for concern of horseshoe tear OS, pt states she saw Dr. Barbra Sarks for routine exam, pt states she sees floaters occasionally, she states her eyes are very dry and she uses systane as needed  Referring physician: Nanetta Batty, [place] [address]  HISTORICAL INFORMATION:   Selected notes from the MEDICAL RECORD NUMBER Referral from Dr. Waynard Edwards office for eval of possible ret tear OS.   CURRENT MEDICATIONS: Current Outpatient Medications (Ophthalmic Drugs)  Medication Sig  . Polyethyl Glycol-Propyl Glycol (SYSTANE) 0.4-0.3 % SOLN Place 1-2 drops into both eyes 3 (three) times daily as needed (for dry eyes.).   No current facility-administered medications for this visit.  (Ophthalmic Drugs)   Current Outpatient Medications (Other)   Medication Sig  . acetaminophen (TYLENOL) 500 MG tablet Take 500 mg by mouth every 6 (six) hours as needed (for headache.).   Marland Kitchen aspirin EC 81 MG tablet Take 81 mg by mouth daily.  . canagliflozin (INVOKANA) 300 MG TABS tablet Take 1 tablet (300 mg total) by mouth daily before breakfast. (Patient not taking: Reported on 02/05/2019)  . fluticasone (FLONASE) 50 MCG/ACT nasal spray Place 2 sprays into both nostrils daily. (Patient taking differently: Place 2 sprays into both nostrils daily as needed (for summer/hay fever allergies). )  . glimepiride (AMARYL) 4 MG tablet Take 1 tablet (4 mg total) by mouth daily before breakfast.  . glucose blood (CONTOUR NEXT TEST) test strip 1 each by Other route daily. And lancets 1/day  . lisinopril-hydrochlorothiazide (ZESTORETIC) 20-12.5 MG tablet Take 0.5 tablets by mouth daily.  . metFORMIN (GLUCOPHAGE-XR) 500 MG 24 hr tablet Take 2 tablets (1,000 mg total) by mouth every morning.  . simvastatin (ZOCOR) 40 MG tablet TAKE 1 TABLET BY MOUTH AT BEDTIME  . sitaGLIPtin (JANUVIA) 100 MG tablet Take 1 tablet (100 mg total) by mouth daily.  . Vitamin D, Ergocalciferol, (DRISDOL) 1.25 MG (50000 UT) CAPS capsule Take 1 capsule (50,000 Units total) by mouth every 7 (seven) days. (Patient not taking: Reported on 12/29/2018)  . zolpidem (AMBIEN) 5 MG tablet Take 1 tablet (5 mg total) by mouth at bedtime as needed for sleep. (Patient not taking: Reported on 02/05/2019)  No current facility-administered medications for this visit.  (Other)      REVIEW OF SYSTEMS: ROS    Positive for: Endocrine, Eyes   Negative for: Constitutional, Gastrointestinal, Neurological, Skin, Genitourinary, Musculoskeletal, HENT, Cardiovascular, Respiratory, Psychiatric, Allergic/Imm, Heme/Lymph   Last edited by Matthew Folks, COA on 02/27/2019  9:35 AM. (History)       ALLERGIES No Known Allergies  PAST MEDICAL HISTORY Past Medical History:  Diagnosis Date  . Diabetes mellitus   .  GERD (gastroesophageal reflux disease)   . HTN (hypertension)   . Hypercholesteremia   . Hypercholesterolemia   . Migraines   . Type II diabetes mellitus ([place])    Past Surgical History:  Procedure Laterality Date  . ABDOMINAL HYSTERECTOMY  2008   Leiomyoma/menorrhagia  . COLONOSCOPY  2012  . COLONOSCOPY N/A 04/21/2017   Procedure: COLONOSCOPY;  Surgeon: Daneil Dolin, MD;  Location: [facility];  Service: Endoscopy;  Laterality: N/A;  2:15pm  . LASER ABLATION OF THE CERVIX     dysplasia    FAMILY HISTORY Family History  Problem Relation Age of Onset  . Colon polyps Maternal Aunt   . Colon polyps Paternal Uncle   . Colon cancer Maternal Aunt        in 31s when diagnosed.   Marland Kitchen Heart disease Father   . Diabetes Father   . Diabetes Mother   . Diabetes Sister   . Diabetes Brother   . Heart disease Brother   . Cancer Maternal Aunt        Pancreatic    SOCIAL HISTORY Social History   Tobacco Use  . Smoking status: Never Smoker  . Smokeless tobacco: Never Used  Substance Use Topics  . Alcohol use: No    Alcohol/week: 0.0 standard drinks  . Drug use: No         OPHTHALMIC EXAM:  Base Eye Exam    Visual Acuity (Snellen - Linear)      Right Left   Dist cc 20/20 -2 20/20 -2   Correction: Glasses       Tonometry (Tonopen, 9:38 AM)      Right Left   Pressure 13 14       Pupils      Dark Light Shape React APD   Right 3 2 Round Brisk None   Left 3 2 Round Brisk None       Visual Fields (Counting fingers)      Left Right    Full Full       Extraocular Movement      Right Left    Full, Ortho Full, Ortho       Neuro/Psych    Oriented x3: Yes   Mood/Affect: Normal       Dilation    Both eyes: 1.0% Mydriacyl, 2.5% Phenylephrine @ 9:38 AM        Slit Lamp and Fundus Exam    Slit Lamp Exam      Right Left   Lids/Lashes Dermatochalasis - upper lid Dermatochalasis - upper lid, mild Meibomian gland dysfunction   Conjunctiva/Sclera Mild Melanosis  Mild Melanosis   Cornea Trace Debris in tear film, trace Punctate epithelial erosions Trace Debris in tear film, 1-2 Punctate epithelial erosions   Anterior Chamber Deep and quiet Deep and quiet   Iris Round and dilated, No NVI Round and dilated, No NVI   Lens 1-2+ Nuclear sclerosis, 2+ Cortical cataract 1-2+ Nuclear sclerosis, 2+ Cortical cataract   Vitreous Vitreous syneresis Vitreous  syneresis       Fundus Exam      Right Left   Disc Pink and Sharp, tilted, temporal PPA Pink and Sharp, tilted, temporal PPA   C/D Ratio 0.5 0.5   Macula Flat, irregular foveal reflex, Retinal pigment epithelial mottling, mild central Cystic changes, no hemorrhage Flat, Good foveal reflex, RPE mottling and clumping, No heme or edema   Vessels Vascular attenuation, AV crossing changes Mild Tortuousity, Vascular attenuation, AV crossing changes   Periphery Attached, peripheral cystoid degeneration, very shallow Retinoschisis inferiorly Attached, patch of WWP temporally, shallow peripheral  Retinoschisis inferiorly, No RT/RD on 360 scleral depression         Refraction    Wearing Rx      Sphere Cylinder Axis Add   Right -9.25 +0.50 147 +1.50   Left -8.25 Sphere  +1.50   Age: 60yr   Type: PAL       Manifest Refraction      Sphere Cylinder Axis Dist VA   Right -9.50 +0.25 120 20/20   Left -9.25 +0.50 045 20/20          IMAGING AND PROCEDURES  Imaging and Procedures for _0 @  OCT, Retina - OU - Both Eyes       Right Eye Quality was good. Central Foveal Thickness: 262. Progression has no prior data. Findings include abnormal foveal contour, intraretinal fluid, no SRF, vitreous traction, myopic contour (Mild VMT with central cystic changes; peripheral retinoschisis caught on widefield, nasal, temporal and inferior quads).   Left Eye Quality was good. Central Foveal Thickness: 239. Progression has no prior data. Findings include normal foveal contour, no IRF, no SRF, vitreomacular adhesion ,  myopic contour (Multi laminar retinoschisis inferior and nasal to disc caught on widefield).   Notes *Images captured and stored on drive  Diagnosis / Impression:  OD: VMT with trace cystic changes; shallow peripheral schisis OS: NFP, no IRF/SRF; Multi laminar retinoschisis inferior and nasal to disc caught on widefield  Clinical management:  See below  Abbreviations: NFP - Normal foveal profile. CME - cystoid macular edema. PED - pigment epithelial detachment. IRF - intraretinal fluid. SRF - subretinal fluid. EZ - ellipsoid zone. ERM - epiretinal membrane. ORA - outer retinal atrophy. ORT - outer retinal tubulation. SRHM - subretinal hyper-reflective material                 ASSESSMENT/PLAN:    ICD-10-CM   1. Bilateral retinoschisis  H33.103   2. Retinal edema  H35.81 OCT, Retina - OU - Both Eyes  3. Vitreomacular adhesion of right eye  H43.821   4. Diabetes mellitus type 2 without retinopathy ([place]  E11.9   5. Essential hypertension  I10   6. Hypertensive retinopathy of both eyes  H35.033   7. Combined forms of age-related cataract of both eyes  H25.813   8. Severe myopia of both eyes  H52.13     1,2. Retinoschisis OU  - shallow, peripheral, multilaminar retinoschisis OU  - caught on widefield OCT  - no RT/RD OU  - discussed findings, prognosis  - no intervention indicated at this time  - monitor  - f/u 3 months  3. VMT OD  - mild cystic changes in fovea  - discussed findings, prognosis  - no intervention indicated at this time  - monitor  - f/u 3 months  4. Diabetes mellitus, type 2 without retinopathy  - The incidence, risk factors for progression, natural history and treatment options for diabetic retinopathy  were discussed with patient.    - The need for close monitoring of blood glucose, blood pressure, and serum lipids, avoiding cigarette or any type of tobacco, and the need for long term follow up was also discussed with patient.  - monitor  5,6.  Hypertensive retinopathy OU  - discussed importance of tight BP control  - monitor  7. Mixed form age related cataracts OU  - The symptoms of cataract, surgical options, and treatments and risks were discussed with patient.  - discussed diagnosis and progression  - not yet visually significant  - monitor for now  8. Severe Myopia OU - discussed association of high myopia with lattice degeneration and increased risk of RT/RD - of note, OS with focal, peripheral white-without-pressure -- has some resemblance to HST or early sea-fan type lesion, but on scleral depression, no retinal break - no RT/RD on scleral depression   Ophthalmic Meds Ordered this visit:  No orders of the defined types were placed in this encounter.      Return in about 3 months (around 05/30/2019) for f/u retinoschisis OU, DFE, OCT, OPTOS.  There are no Patient Instructions on file for this visit.   Explained the diagnoses, plan, and follow up with the patient and they expressed understanding.  Patient expressed understanding of the importance of proper follow up care.   This document serves as a record of services personally performed by Gardiner Sleeper, MD, PhD. It was created on their behalf by Estill Bakes, COT an ophthalmic technician. The creation of this record is the provider's dictation and/or activities during the visit.    Electronically signed by: Estill Bakes, COT 02/22/19 @ 11:34 AM   This document serves as a record of services personally performed by Gardiner Sleeper, MD, PhD. It was created on their behalf by Ernest Mallick, OA, an ophthalmic assistant. The creation of this record is the provider's dictation and/or activities during the visit.    Electronically signed by: Ernest Mallick, OA 11.03.2020 11:34 AM  Gardiner Sleeper, M.D., Ph.D. Diseases & Surgery of the Retina and Vitreous [facility] 03/01/19  I have reviewed the above documentation for accuracy and  completeness, and I agree with the above. Gardiner Sleeper, M.D., Ph.D. 03/01/19 11:34 AM    Abbreviations: M myopia (nearsighted); A astigmatism; H hyperopia (farsighted); P presbyopia; Mrx spectacle prescription;  CTL contact lenses; OD right eye; OS left eye; OU both eyes  XT exotropia; ET esotropia; PEK punctate epithelial keratitis; PEE punctate epithelial erosions; DES dry eye syndrome; MGD meibomian gland dysfunction; ATs artificial tears; PFAT's preservative free artificial tears; [place] nuclear sclerotic cataract; PSC posterior subcapsular cataract; ERM epi-retinal membrane; PVD posterior vitreous detachment; RD retinal detachment; DM diabetes mellitus; DR diabetic retinopathy; NPDR non-proliferative diabetic retinopathy; PDR proliferative diabetic retinopathy; CSME clinically significant macular edema; DME diabetic macular edema; dbh dot blot hemorrhages; CWS cotton wool spot; POAG primary open angle glaucoma; C/D cup-to-disc ratio; HVF humphrey visual field; GVF goldmann visual field; OCT optical coherence tomography; IOP intraocular pressure; BRVO Branch retinal vein occlusion; CRVO central retinal vein occlusion; CRAO central retinal artery occlusion; BRAO branch retinal artery occlusion; RT retinal tear; SB scleral buckle; PPV pars plana vitrectomy; VH Vitreous hemorrhage; PRP panretinal laser photocoagulation; IVK intravitreal kenalog; VMT vitreomacular traction; MH Macular hole;  NVD neovascularization of the disc; NVE neovascularization elsewhere; AREDS age related eye disease study; ARMD age related macular degeneration; POAG primary open angle glaucoma; EBMD epithelial/anterior basement membrane dystrophy; ACIOL  anterior chamber intraocular lens; IOL intraocular lens; PCIOL posterior chamber intraocular lens; Phaco/IOL phacoemulsification with intraocular lens placement; [place] photorefractive keratectomy; LASIK laser assisted in situ keratomileusis; HTN hypertension; DM diabetes mellitus; COPD  chronic obstructive pulmonary disease

## 2019-02-27 ENCOUNTER — Ambulatory Visit (INDEPENDENT_AMBULATORY_CARE_PROVIDER_SITE_OTHER): Payer: BC Managed Care – PPO | Admitting: Ophthalmology

## 2019-02-27 ENCOUNTER — Encounter (INDEPENDENT_AMBULATORY_CARE_PROVIDER_SITE_OTHER): Payer: Self-pay | Admitting: Ophthalmology

## 2019-02-27 DIAGNOSIS — H33103 Unspecified retinoschisis, bilateral: Secondary | ICD-10-CM

## 2019-02-27 DIAGNOSIS — H3581 Retinal edema: Secondary | ICD-10-CM | POA: Diagnosis not present

## 2019-02-27 DIAGNOSIS — H25813 Combined forms of age-related cataract, bilateral: Secondary | ICD-10-CM

## 2019-02-27 DIAGNOSIS — H43821 Vitreomacular adhesion, right eye: Secondary | ICD-10-CM | POA: Diagnosis not present

## 2019-02-27 DIAGNOSIS — I10 Essential (primary) hypertension: Secondary | ICD-10-CM

## 2019-02-27 DIAGNOSIS — H5213 Myopia, bilateral: Secondary | ICD-10-CM

## 2019-02-27 DIAGNOSIS — H35033 Hypertensive retinopathy, bilateral: Secondary | ICD-10-CM

## 2019-02-27 DIAGNOSIS — E119 Type 2 diabetes mellitus without complications: Secondary | ICD-10-CM | POA: Diagnosis not present

## 2019-03-01 ENCOUNTER — Encounter (INDEPENDENT_AMBULATORY_CARE_PROVIDER_SITE_OTHER): Payer: Self-pay | Admitting: Ophthalmology

## 2019-03-09 ENCOUNTER — Ambulatory Visit: Payer: BC Managed Care – PPO | Admitting: Endocrinology

## 2019-03-09 ENCOUNTER — Ambulatory Visit: Payer: BC Managed Care – PPO | Admitting: Family Medicine

## 2019-03-19 ENCOUNTER — Other Ambulatory Visit: Payer: Self-pay

## 2019-03-20 LAB — HM DIABETES EYE EXAM

## 2019-04-17 ENCOUNTER — Telehealth: Payer: Self-pay | Admitting: Family Medicine

## 2019-04-17 MED ORDER — SIMVASTATIN 40 MG PO TABS: 40.0000 mg | ORAL_TABLET | Freq: Every day | ORAL | 0 refills | Status: DC

## 2019-04-17 NOTE — Telephone Encounter (Signed)
Pt has a VV scheduled with Tabori on 04/19/2019 but she is out of her medications now. Can we send in refills fo the pt? Pt can be reached at the home #

## 2019-04-17 NOTE — Telephone Encounter (Signed)
The only medication Dr. Birdie Riddle fills appears to be the simvastatin. This was filled to her pharmacy.

## 2019-04-18 ENCOUNTER — Other Ambulatory Visit: Payer: Self-pay | Admitting: Endocrinology

## 2019-04-18 DIAGNOSIS — I10 Essential (primary) hypertension: Secondary | ICD-10-CM

## 2019-04-19 ENCOUNTER — Ambulatory Visit (INDEPENDENT_AMBULATORY_CARE_PROVIDER_SITE_OTHER): Payer: BC Managed Care – PPO | Admitting: Family Medicine

## 2019-04-19 ENCOUNTER — Other Ambulatory Visit: Payer: Self-pay

## 2019-04-19 ENCOUNTER — Encounter: Payer: Self-pay | Admitting: Family Medicine

## 2019-04-19 DIAGNOSIS — Z8619 Personal history of other infectious and parasitic diseases: Secondary | ICD-10-CM | POA: Diagnosis not present

## 2019-04-19 DIAGNOSIS — I10 Essential (primary) hypertension: Secondary | ICD-10-CM | POA: Diagnosis not present

## 2019-04-19 DIAGNOSIS — E785 Hyperlipidemia, unspecified: Secondary | ICD-10-CM | POA: Diagnosis not present

## 2019-04-19 DIAGNOSIS — Z8616 Personal history of COVID-19: Secondary | ICD-10-CM

## 2019-04-19 MED ORDER — SIMVASTATIN 40 MG PO TABS: 40.0000 mg | ORAL_TABLET | Freq: Every day | ORAL | 1 refills | Status: DC

## 2019-04-19 MED ORDER — LISINOPRIL-HYDROCHLOROTHIAZIDE 20-12.5 MG PO TABS: 1.0000 | ORAL_TABLET | Freq: Every day | ORAL | 1 refills | Status: DC

## 2019-04-19 MED ORDER — GUAIFENESIN-CODEINE 100-10 MG/5ML PO SYRP: 10.0000 mL | ORAL_SOLUTION | Freq: Three times a day (TID) | ORAL | 0 refills | Status: DC | PRN

## 2019-04-19 NOTE — Telephone Encounter (Signed)
Please forward refill request to pt's primary care provider. Also, f/u ov is due

## 2019-04-19 NOTE — Progress Notes (Signed)
I have discussed the procedure for the virtual visit with the patient who has given consent to proceed with assessment and treatment.   Pt unable to obtain vitals.   Tabari Volkert L Shana Younge, CMA     

## 2019-04-19 NOTE — Progress Notes (Signed)
Virtual Visit via Video   I connected with patient on 04/19/19 at  1:00 PM EST by a video enabled telemedicine application and verified that I am speaking with the correct person using two identifiers.  Location patient: Home Location provider: Acup[facility]urist, Office Persons participating in the virtual visit: Patient, Provider, [place] (Jess B)  I discussed the limitations of evaluation and management by telemedicine and the availability of in person appointments. The patient expressed understanding and agreed to proceed.  Subjective:   HPI:  HTN- chrnoic problem, on Lisinopril HCTZ 20/12.5mg  daily.  Has been out of medication- needs refill.  Denies CP, SOB, edema.  + HA since COVID  Hyperlipidemia- chronic problem, on Simvastatin 40mg  daily.  Denies abd pain, N/V.  COVID- pt developed sxs on 11/23 and tested +.  Continues to have cough and fatigue.  Having frequent HAs.  Continues to have body aches.  Has been out of work x1 month.  Denies SOB.  No recent fever last fever 10-14 days ago.  ROS:   See pertinent positives and negatives per HPI.  Patient Active Problem List   Diagnosis Date Noted  . H/O vitamin D deficiency 03/26/2016  . Insomnia 03/26/2016  . Physical exam 11/07/2014  . Elevated serum creatinine 04/07/2012  . Chest pain 11/11/2011  . Anxiety and depression 02/25/2011  . Hemangioma of liver 11/09/2010  . Rectal bleeding 10/01/2010  . Goiter 02/25/2010  . DM type 2 with diabetic peripheral neuropathy ([place]) 05/16/2009  . Hyperlipidemia 05/16/2009  . COUGH, CHRONIC 03/19/2009  . Allergic rhinitis 10/03/2008  . COMMON MIGRAINE 08/08/2008  . Essential hypertension 08/08/2008  . UNSPECIFIED CIRCULATORY SYSTEM DISORDER 08/08/2008    Social History   Tobacco Use  . Smoking status: Never Smoker  . Smokeless tobacco: Never Used  Substance Use Topics  . Alcohol use: No    Alcohol/week: 0.0 standard drinks    Current Outpatient Medications:  .   acetaminophen (TYLENOL) 500 MG tablet, Take 500 mg by mouth every 6 (six) hours as needed (for headache.). , Disp: , Rfl:  .  aspirin EC 81 MG tablet, Take 81 mg by mouth daily., Disp: , Rfl:  .  fluticasone (FLONASE) 50 MCG/ACT nasal spray, Place 2 sprays into both nostrils daily. (Patient taking differently: Place 2 sprays into both nostrils daily as needed (for summer/hay fever allergies). ), Disp: 16 g, Rfl: 6 .  glimepiride (AMARYL) 4 MG tablet, Take 1 tablet (4 mg total) by mouth daily before breakfast., Disp: 90 tablet, Rfl: 3 .  glucose blood (CONTOUR NEXT TEST) test strip, 1 each by Other route daily. And lancets 1/day, Disp: 100 each, Rfl: 3 .  lisinopril-hydrochlorothiazide (ZESTORETIC) 20-12.5 MG tablet, Take 0.5 tablets by mouth daily., Disp: 45 tablet, Rfl: 0 .  metFORMIN (GLUCOPHAGE-XR) 500 MG 24 hr tablet, Take 2 tablets (1,000 mg total) by mouth every morning., Disp: 180 tablet, Rfl: 3 .  Polyethyl Glycol-Propyl Glycol (SYSTANE) 0.4-0.3 % SOLN, Place 1-2 drops into both eyes 3 (three) times daily as needed (for dry eyes.)., Disp: , Rfl:  .  simvastatin (ZOCOR) 40 MG tablet, Take 1 tablet (40 mg total) by mouth at bedtime., Disp: 90 tablet, Rfl: 0 .  sitaGLIPtin (JANUVIA) 100 MG tablet, Take 1 tablet (100 mg total) by mouth daily., Disp: 90 tablet, Rfl: 3 .  zolpidem (AMBIEN) 5 MG tablet, Take 1 tablet (5 mg total) by mouth at bedtime as needed for sleep. (Patient not taking: Reported on 02/05/2019), Disp: 15 tablet, Rfl:  0  No Known Allergies  Objective:   There were no vitals taken for this visit. AAOx3, NAD NCAT, EOMI No obvious CN deficits Coloring WNL Pt is able to speak clearly, coherently without shortness of breath or increased work of breathing.  Thought process is linear.  Mood is appropriate.   Assessment and Plan:   HTN- chronic problem.  On Lisinopril HCTZ daily.  Unable to obtain vitals today.  Will get BP when she returns for lab work.  Refills  provided.  Hyperlipidemia- chronic problem.  Tolerating statin.  Check labs.  Adjust meds prn   COVID- pt was dx'd 1 month ago.  Continues to struggle w/ cough, body aches, fatigue, headaches.  Will send cough syrup to improve sxs for pt.  Encouraged rest, fluids, and continued attention to sxs.   Annye Asa, MD 04/19/2019

## 2019-04-19 NOTE — Telephone Encounter (Signed)
Called pt to schedule appt per Dr. Cordelia Pen request. LVM requesting returned call.

## 2019-04-19 NOTE — Telephone Encounter (Signed)
Please advise 

## 2019-04-19 NOTE — Telephone Encounter (Signed)
Medication filled in pt encounter

## 2019-04-19 NOTE — Telephone Encounter (Signed)
Dr. Birdie Riddle - Per Dr. Cordelia Pen request, I am forwarding this refill request. Please review and refill if appropriate.  Front Desk - please call pt to schedule appt for next availability

## 2019-05-01 ENCOUNTER — Telehealth: Payer: Self-pay

## 2019-05-01 NOTE — Telephone Encounter (Signed)
LMOVM to schedule CPE in 6 months per PCP

## 2019-05-14 ENCOUNTER — Ambulatory Visit (INDEPENDENT_AMBULATORY_CARE_PROVIDER_SITE_OTHER): Payer: BC Managed Care – PPO

## 2019-05-14 ENCOUNTER — Other Ambulatory Visit: Payer: Self-pay

## 2019-05-14 DIAGNOSIS — I10 Essential (primary) hypertension: Secondary | ICD-10-CM

## 2019-05-15 LAB — CBC WITH DIFFERENTIAL/PLATELET
Basophils Absolute: 0.1 10*3/uL (ref 0.0–0.1)
Basophils Relative: 1 % (ref 0.0–3.0)
Eosinophils Absolute: 0.1 10*3/uL (ref 0.0–0.7)
Eosinophils Relative: 2.1 % (ref 0.0–5.0)
HCT: 41.5 % (ref 36.0–46.0)
Hemoglobin: 14.1 g/dL (ref 12.0–15.0)
Lymphocytes Relative: 30.5 % (ref 12.0–46.0)
Lymphs Abs: 2.1 10*3/uL (ref 0.7–4.0)
MCHC: 34.1 g/dL (ref 30.0–36.0)
MCV: 84.4 fl (ref 78.0–100.0)
Monocytes Absolute: 0.4 10*3/uL (ref 0.1–1.0)
Monocytes Relative: 5.9 % (ref 3.0–12.0)
Neutro Abs: 4.1 10*3/uL (ref 1.4–7.7)
Neutrophils Relative %: 60.5 % (ref 43.0–77.0)
Platelets: 233 10*3/uL (ref 150.0–400.0)
RBC: 4.91 Mil/uL (ref 3.87–5.11)
RDW: 13.2 % (ref 11.5–15.5)
WBC: 6.7 10*3/uL (ref 4.0–10.5)

## 2019-05-15 LAB — HEPATIC FUNCTION PANEL
ALT: 26 U/L (ref 0–35)
AST: 17 U/L (ref 0–37)
Albumin: 4.6 g/dL (ref 3.5–5.2)
Alkaline Phosphatase: 80 U/L (ref 39–117)
Bilirubin, Direct: 0.1 mg/dL (ref 0.0–0.3)
Total Bilirubin: 0.4 mg/dL (ref 0.2–1.2)
Total Protein: 6.8 g/dL (ref 6.0–8.3)

## 2019-05-15 LAB — BASIC METABOLIC PANEL
BUN: 7 mg/dL (ref 6–23)
CO2: 30 mEq/L (ref 19–32)
Calcium: 10 mg/dL (ref 8.4–10.5)
Chloride: 97 mEq/L (ref 96–112)
Creatinine, Ser: 0.77 mg/dL (ref 0.40–1.20)
GFR: 94.35 mL/min (ref >60.00)
Glucose, Bld: 304 mg/dL — ABNORMAL HIGH (ref 70–99)
Potassium: 4.3 mEq/L (ref 3.5–5.1)
Sodium: 137 mEq/L (ref 135–145)

## 2019-05-15 LAB — LIPID PANEL
Cholesterol: 128 mg/dL (ref 0–200)
HDL: 37.4 mg/dL — ABNORMAL LOW (ref >39.00)
NonHDL: 90.16
Total CHOL/HDL Ratio: 3
Triglycerides: 229 mg/dL — ABNORMAL HIGH (ref 0.0–149.0)
VLDL: 45.8 mg/dL — ABNORMAL HIGH (ref 0.0–40.0)

## 2019-05-15 LAB — LDL CHOLESTEROL, DIRECT: Direct LDL: 72 mg/dL

## 2019-05-15 LAB — TSH: TSH: 0.98 u[IU]/mL (ref 0.35–4.50)

## 2019-05-24 NOTE — Progress Notes (Signed)
[facility] Note  05/30/2019     CHIEF COMPLAINT Patient presents for Retina Follow Up   HISTORY OF PRESENT ILLNESS: Dana Payne is a 54 y.o. female who presents to the clinic today for:   HPI    Retina Follow Up    Patient presents with  Other.  In both eyes.  This started months ago.  Severity is moderate.  Duration of months.  Since onset it is gradually worsening.  I, the attending physician,  performed the HPI with the patient and updated documentation appropriately.          Comments    BS: 218 this AM Last HgA1c: 8.8 12/2018 Patient states she thinks her vision may be a little worse but states BS has been fluctuating.  Pt has appt today for blood work.  Patient denies eye pain.  Pt denies any new or worsening floaters or fol OU.       Last edited by Bernarda Caffey, MD on 05/30/2019  1:57 PM. (History)    pt    Referring physician: Nanetta Batty, [place] [address]  HISTORICAL INFORMATION:   Selected notes from the MEDICAL RECORD NUMBER Referral from Dr. Waynard Edwards office for eval of possible ret tear OS.   CURRENT MEDICATIONS: Current Outpatient Medications (Ophthalmic Drugs)  Medication Sig  . Polyethyl Glycol-Propyl Glycol (SYSTANE) 0.4-0.3 % SOLN Place 1-2 drops into both eyes 3 (three) times daily as needed (for dry eyes.).   No current facility-administered medications for this visit. (Ophthalmic Drugs)   Current Outpatient Medications (Other)  Medication Sig  . acetaminophen (TYLENOL) 500 MG tablet Take 500 mg by mouth every 6 (six) hours as needed (for headache.).   Marland Kitchen aspirin EC 81 MG tablet Take 81 mg by mouth daily.  . fluticasone (FLONASE) 50 MCG/ACT nasal spray Place 2 sprays into both nostrils daily. (Patient taking differently: Place 2 sprays into both nostrils daily as needed (for summer/hay fever allergies). )  . glimepiride (AMARYL) 4 MG tablet Take 1 tablet (4 mg total) by mouth daily  before breakfast.  . glucose blood (CONTOUR NEXT TEST) test strip 1 each by Other route daily. And lancets 1/day  . guaiFENesin-codeine (ROBITUSSIN AC) 100-10 MG/5ML syrup Take 10 mLs by mouth 3 (three) times daily as needed for cough.  Marland Kitchen lisinopril-hydrochlorothiazide (ZESTORETIC) 20-12.5 MG tablet Take 1 tablet by mouth daily.  . metFORMIN (GLUCOPHAGE-XR) 500 MG 24 hr tablet Take 2 tablets (1,000 mg total) by mouth every morning.  . simvastatin (ZOCOR) 40 MG tablet Take 1 tablet (40 mg total) by mouth at bedtime.  . sitaGLIPtin (JANUVIA) 100 MG tablet Take 1 tablet (100 mg total) by mouth daily.  Marland Kitchen zolpidem (AMBIEN) 5 MG tablet Take 1 tablet (5 mg total) by mouth at bedtime as needed for sleep. (Patient not taking: Reported on 02/05/2019)   No current facility-administered medications for this visit. (Other)      REVIEW OF SYSTEMS: ROS    Positive for: Endocrine, Eyes   Negative for: Constitutional, Gastrointestinal, Neurological, Skin, Genitourinary, Musculoskeletal, HENT, Cardiovascular, Respiratory, Psychiatric, Allergic/Imm, Heme/Lymph   Last edited by Doneen Poisson on 05/30/2019 12:44 PM. (History)       ALLERGIES No Known Allergies  PAST MEDICAL HISTORY Past Medical History:  Diagnosis Date  . Diabetes mellitus   . GERD (gastroesophageal reflux disease)   . HTN (hypertension)   . Hypercholesteremia   . Hypercholesterolemia   . Migraines   .  Type II diabetes mellitus ([place])    Past Surgical History:  Procedure Laterality Date  . ABDOMINAL HYSTERECTOMY  2008   Leiomyoma/menorrhagia  . COLONOSCOPY  2012  . COLONOSCOPY N/A 04/21/2017   Procedure: COLONOSCOPY;  Surgeon: Daneil Dolin, MD;  [facility];  Service: Endoscopy;  Laterality: N/A;  2:15pm  . LASER ABLATION OF THE CERVIX     dysplasia    FAMILY HISTORY Family History  Problem Relation Age of Onset  . Colon polyps Maternal Aunt   . Colon polyps Paternal Uncle   . Colon cancer Maternal  Aunt        in 85s when diagnosed.   Marland Kitchen Heart disease Father   . Diabetes Father   . Diabetes Mother   . Diabetes Sister   . Diabetes Brother   . Heart disease Brother   . Cancer Maternal Aunt        Pancreatic    SOCIAL HISTORY Social History   Tobacco Use  . Smoking status: Never Smoker  . Smokeless tobacco: Never Used  Substance Use Topics  . Alcohol use: No    Alcohol/week: 0.0 standard drinks  . Drug use: No         OPHTHALMIC EXAM:  Base Eye Exam    Visual Acuity (Snellen - Linear)      Right Left   Dist cc 20/20 -1 20/20 -2   Correction: Glasses       Tonometry (Tonopen, 12:49 PM)      Right Left   Pressure 20 20       Pupils      Dark Light Shape React APD   Right 3 2 Round Brisk 0   Left 3 2 Round Brisk 0       Visual Fields      Left Right    Full Full       Extraocular Movement      Right Left    Full Full       Neuro/Psych    Oriented x3: Yes   Mood/Affect: Normal       Dilation    Both eyes: 1.0% Mydriacyl, 2.5% Phenylephrine @ 12:49 PM        Slit Lamp and Fundus Exam    Slit Lamp Exam      Right Left   Lids/Lashes mild Meibomian gland dysfunction Dermatochalasis - upper lid, mild Meibomian gland dysfunction   Conjunctiva/Sclera Mild Melanosis Mild Melanosis   Cornea Trace Debris in tear film, trace Punctate epithelial erosions Trace Debris in tear film, 1+Punctate epithelial erosions   Anterior Chamber Deep and quiet Deep and quiet   Iris Round and dilated, No NVI Round and dilated, No NVI   Lens 2+ Nuclear sclerosis, 2+ Cortical cataract 2+ Nuclear sclerosis, 2+ Cortical cataract   Vitreous Vitreous syneresis Vitreous syneresis       Fundus Exam      Right Left   Disc Pink and Sharp, tilted, temporal PPA Pink and Sharp, tilted, temporal PPA   C/D Ratio 0.5 0.55   Macula Flat, irregular foveal reflex, Retinal pigment epithelial mottling, mild central Cystic changes, no hemorrhage Flat, Good foveal reflex, RPE mottling  and clumping, No heme or edema   Vessels Vascular attenuation, mild Tortuousity Vascular attenuation, mild Tortuousity   Periphery Attached, peripheral cystoid degeneration, very shallow Retinoschisis inferiorly Attached, patch of WWP temporally, shallow peripheral  Retinoschisis inferiorly and nasally, No RT/RD on exam        Refraction  Wearing Rx      Sphere Cylinder Axis Add   Right -9.25 +0.50 147 +1.50   Left -8.25 Sphere  +1.50   Type: PAL          IMAGING AND PROCEDURES  Imaging and Procedures for @TODAY @  OCT, Retina - OU - Both Eyes       Right Eye Quality was good. Central Foveal Thickness: 258. Progression has been stable. Findings include abnormal foveal contour, intraretinal fluid, no SRF, vitreous traction, myopic contour (Mild VMT with central cystic changes; peripheral retinoschisis caught on widefield, nasal, temporal and inferior quads -- stable from prior).   Left Eye Quality was good. Central Foveal Thickness: 235. Progression has been stable. Findings include normal foveal contour, no IRF, no SRF, vitreomacular adhesion , myopic contour (Multi laminar retinoschisis inferior and nasal to disc caught on widefield -- no change from prior).   Notes *Images captured and stored on drive  Diagnosis / Impression:  OD: VMT with trace cystic changes; shallow peripheral schisis -- no change from prior OS: NFP, no IRF/SRF; Multi laminar retinoschisis inferior and nasal to disc caught on widefield -- no change from prior  Clinical management:  See below  Abbreviations: NFP - Normal foveal profile. CME - cystoid macular edema. PED - pigment epithelial detachment. IRF - intraretinal fluid. SRF - subretinal fluid. EZ - ellipsoid zone. ERM - epiretinal membrane. ORA - outer retinal atrophy. ORT - outer retinal tubulation. SRHM - subretinal hyper-reflective material                 ASSESSMENT/PLAN:    ICD-10-CM   1. Bilateral retinoschisis  H33.103   2.  Retinal edema  H35.81 OCT, Retina - OU - Both Eyes  3. Vitreomacular adhesion of right eye  H43.821   4. Diabetes mellitus type 2 without retinopathy ([place])  E11.9   5. Essential hypertension  I10   6. Hypertensive retinopathy of both eyes  H35.033   7. Combined forms of age-related cataract of both eyes  H25.813   8. Severe myopia of both eyes  H52.13     1,2. Retinoschisis OU  - shallow, peripheral, multilaminar retinoschisis OU  - caught on widefield OCT - no change from prior  - no RT/RD OU  - discussed findings, prognosis -- likely related to history of high myopia  - no intervention indicated at this time  - monitor  - f/u 6 months, DFE, OCT (widefield, inferiorly), optos (colors, FAF, inferiorly)  3. VMT OD  - mild cystic changes in fovea -- no change from prior  - discussed findings, prognosis  - no intervention indicated at this time  - monitor  - f/u 6 months  4. Diabetes mellitus, type 2 without retinopathy  - The incidence, risk factors for progression, natural history and treatment options for diabetic retinopathy  were discussed with patient.    - The need for close monitoring of blood glucose, blood pressure, and serum lipids, avoiding cigarette or any type of tobacco, and the need for long term follow up was also discussed with patient.  - monitor  5,6. Hypertensive retinopathy OU  - discussed importance of tight BP control  - monitor  7. Mixed form age related cataracts OU  - The symptoms of cataract, surgical options, and treatments and risks were discussed with patient.  - discussed diagnosis and progression  - not yet visually significant  - monitor for now  8. Severe Myopia OU  - discussed association of high myopia  with lattice degeneration and increased risk of RT/RD  - of note, OS with focal, peripheral white-without-pressure -- has some resemblance to HST or early sea-fan type lesion, but on scleral depression, no retinal break  - no RT/RD on scleral  depression   Ophthalmic Meds Ordered this visit:  No orders of the defined types were placed in this encounter.      Return in about 6 months (around 11/27/2019) for f/u retinoschisis OU, DFE, OCT.  There are no Patient Instructions on file for this visit.   Explained the diagnoses, plan, and follow up with the patient and they expressed understanding.  Patient expressed understanding of the importance of proper follow up care.   This document serves as a record of services personally performed by Gardiner Sleeper, MD, PhD. It was created on their behalf by Roselee Nova, COMT. The creation of this record is the provider's dictation and/or activities during the visit.  Electronically signed by: Roselee Nova, COMT 05/30/19 2:00 PM   This document serves as a record of services personally performed by Gardiner Sleeper, MD, PhD. It was created on their behalf by Ernest Mallick, OA, an ophthalmic assistant. The creation of this record is the provider's dictation and/or activities during the visit.    Electronically signed by: Ernest Mallick, OA 02.03.2021 2:00 PM   Gardiner Sleeper, M.D., Ph.D. Diseases & Surgery of the Retina and [place] 05/30/2019   I have reviewed the above documentation for accuracy and completeness, and I agree with the above. Gardiner Sleeper, M.D., Ph.D. 05/30/19 2:00 PM    Abbreviations: M myopia (nearsighted); A astigmatism; H hyperopia (farsighted); P presbyopia; Mrx spectacle prescription;  CTL contact lenses; OD right eye; OS left eye; OU both eyes  XT exotropia; ET esotropia; PEK punctate epithelial keratitis; PEE punctate epithelial erosions; DES dry eye syndrome; MGD meibomian gland dysfunction; ATs artificial tears; PFAT's preservative free artificial tears; [place] nuclear sclerotic cataract; PSC posterior subcapsular cataract; ERM epi-retinal membrane; PVD posterior vitreous detachment; RD retinal detachment; DM diabetes mellitus;  DR diabetic retinopathy; NPDR non-proliferative diabetic retinopathy; PDR proliferative diabetic retinopathy; CSME clinically significant macular edema; DME diabetic macular edema; dbh dot blot hemorrhages; CWS cotton wool spot; POAG primary open angle glaucoma; C/D cup-to-disc ratio; HVF humphrey visual field; GVF goldmann visual field; OCT optical coherence tomography; IOP intraocular pressure; BRVO Branch retinal vein occlusion; CRVO central retinal vein occlusion; CRAO central retinal artery occlusion; BRAO branch retinal artery occlusion; RT retinal tear; SB scleral buckle; PPV pars plana vitrectomy; VH Vitreous hemorrhage; PRP panretinal laser photocoagulation; IVK intravitreal kenalog; VMT vitreomacular traction; MH Macular hole;  NVD neovascularization of the disc; NVE neovascularization elsewhere; AREDS age related eye disease study; ARMD age related macular degeneration; POAG primary open angle glaucoma; EBMD epithelial/anterior basement membrane dystrophy; ACIOL anterior chamber intraocular lens; IOL intraocular lens; PCIOL posterior chamber intraocular lens; Phaco/IOL phacoemulsification with intraocular lens placement; [place] photorefractive keratectomy; LASIK laser assisted in situ keratomileusis; HTN hypertension; DM diabetes mellitus; COPD chronic obstructive pulmonary disease

## 2019-05-28 ENCOUNTER — Other Ambulatory Visit: Payer: Self-pay

## 2019-05-30 ENCOUNTER — Ambulatory Visit (INDEPENDENT_AMBULATORY_CARE_PROVIDER_SITE_OTHER): Payer: BC Managed Care – PPO | Admitting: Ophthalmology

## 2019-05-30 ENCOUNTER — Encounter: Payer: Self-pay | Admitting: Endocrinology

## 2019-05-30 ENCOUNTER — Ambulatory Visit: Payer: BC Managed Care – PPO | Admitting: Endocrinology

## 2019-05-30 ENCOUNTER — Encounter (INDEPENDENT_AMBULATORY_CARE_PROVIDER_SITE_OTHER): Payer: Self-pay | Admitting: Ophthalmology

## 2019-05-30 ENCOUNTER — Other Ambulatory Visit: Payer: Self-pay

## 2019-05-30 VITALS — BP 140/70 | HR 88 | Ht 65.0 in | Wt 158.8 lb

## 2019-05-30 DIAGNOSIS — E119 Type 2 diabetes mellitus without complications: Secondary | ICD-10-CM | POA: Diagnosis not present

## 2019-05-30 DIAGNOSIS — H33103 Unspecified retinoschisis, bilateral: Secondary | ICD-10-CM | POA: Diagnosis not present

## 2019-05-30 DIAGNOSIS — H25813 Combined forms of age-related cataract, bilateral: Secondary | ICD-10-CM

## 2019-05-30 DIAGNOSIS — H35033 Hypertensive retinopathy, bilateral: Secondary | ICD-10-CM

## 2019-05-30 DIAGNOSIS — E1142 Type 2 diabetes mellitus with diabetic polyneuropathy: Secondary | ICD-10-CM

## 2019-05-30 DIAGNOSIS — H3581 Retinal edema: Secondary | ICD-10-CM | POA: Diagnosis not present

## 2019-05-30 DIAGNOSIS — H43821 Vitreomacular adhesion, right eye: Secondary | ICD-10-CM

## 2019-05-30 DIAGNOSIS — H5213 Myopia, bilateral: Secondary | ICD-10-CM

## 2019-05-30 DIAGNOSIS — I10 Essential (primary) hypertension: Secondary | ICD-10-CM

## 2019-05-30 LAB — POCT GLYCOSYLATED HEMOGLOBIN (HGB A1C): Hemoglobin A1C: 10 % — AB (ref 4.0–5.6)

## 2019-05-30 MED ORDER — RYBELSUS 3 MG PO TABS: 3.0000 mg | ORAL_TABLET | Freq: Every day | ORAL | 11 refills | Status: DC

## 2019-05-30 MED ORDER — CANAGLIFLOZIN 300 MG PO TABS: 300.0000 mg | ORAL_TABLET | Freq: Every day | ORAL | 11 refills | Status: DC

## 2019-05-30 NOTE — Patient Instructions (Addendum)
I have sent 2 prescriptions to your pharmacy: to resume Invokana, and to add "Rybelsus."  Please continue the same other diabetes medications.   check your blood sugar once a day.  vary the time of day when you check, between before the 3 meals, and at bedtime.  also check if you have symptoms of your blood sugar being too high or too low.  please keep a record of the readings and bring it to your next appointment here (or you can bring the meter itself).  You can write it on any piece of paper.  please call us sooner if your blood sugar goes below 70, or if you have a lot of readings over 200.   Please come back for a follow-up appointment in 2 months.

## 2019-05-30 NOTE — Progress Notes (Signed)
Subjective:    Patient ID: Dana Payne, female    DOB: March 03, 1966, 54 y.o.   MRN: [id number]  HPI Pt returns for f/u of diabetes mellitus:  DM type: 2 Dx'ed: [id number] Complications: polyneuropathy.   Therapy: 3 oral meds.   GDM: never DKA: never Severe hypoglycemia: never Pancreatitis: never.   Pancreatic imaging: normal on 2012 CT.   SDOH: she refuses insulin.   Other: none Interval history: no cbg record, but states cbg's vary from 140-237.  pt states she feels well in general.  She reports she takes meds as rx'ed. She did not take Invokana.   Past Medical History:  Diagnosis Date  . Diabetes mellitus   . GERD (gastroesophageal reflux disease)   . HTN (hypertension)   . Hypercholesteremia   . Hypercholesterolemia   . Migraines   . Type II diabetes mellitus ([place])     Past Surgical History:  Procedure Laterality Date  . ABDOMINAL HYSTERECTOMY  2008   Leiomyoma/menorrhagia  . COLONOSCOPY  2012  . COLONOSCOPY N/A 04/21/2017   Procedure: COLONOSCOPY;  Surgeon: Daneil Dolin, MD;  [facility];  Service: Endoscopy;  Laterality: N/A;  2:15pm  . LASER ABLATION OF THE CERVIX     dysplasia    Social History   Socioeconomic History  . Marital status: Single    Spouse name: Not on file  . Number of children: Not on file  . Years of education: Not on file  . Highest education level: Not on file  Occupational History  . Not on file  Tobacco Use  . Smoking status: Never Smoker  . Smokeless tobacco: Never Used  Substance and Sexual Activity  . Alcohol use: No    Alcohol/week: 0.0 standard drinks  . Drug use: No  . Sexual activity: Yes    Birth control/protection: None    Comment: HYSTERECTOMY-1st intercourse 54 yo-5 partners  Other Topics Concern  . Not on file  Social History Narrative   SINGLE, NO KIDS. WORKS RETAILS. HOBBIES: COOK, Barrister's clerk, [place].   Social Determinants of Health   Financial Resource Strain:   . Difficulty of Paying Living  Expenses: Not on file  Food Insecurity:   . Worried About Charity fundraiser in the Last Year: Not on file  . Ran Out of Food in the Last Year: Not on file  Transportation Needs:   . Lack of Transportation (Medical): Not on file  . Lack of Transportation (Non-Medical): Not on file  Physical Activity:   . Days of Exercise per Week: Not on file  . Minutes of Exercise per Session: Not on file  Stress:   . Feeling of Stress : Not on file  Social Connections:   . Frequency of Communication with Friends and Family: Not on file  . Frequency of Social Gatherings with Friends and Family: Not on file  . Attends Religious Services: Not on file  . Active Member of Clubs or Organizations: Not on file  . Attends Archivist Meetings: Not on file  . Marital Status: Not on file  Intimate Partner Violence:   . Fear of Current or Ex-Partner: Not on file  . Emotionally Abused: Not on file  . Physically Abused: Not on file  . Sexually Abused: Not on file    Current Outpatient Medications on File Prior to Visit  Medication Sig Dispense Refill  . acetaminophen (TYLENOL) 500 MG tablet Take 500 mg by mouth every 6 (six) hours as needed (for headache.).     Marland Kitchen  aspirin EC 81 MG tablet Take 81 mg by mouth daily.    . fluticasone (FLONASE) 50 MCG/ACT nasal spray Place 2 sprays into both nostrils daily. (Patient taking differently: Place 2 sprays into both nostrils daily as needed (for summer/hay fever allergies). ) 16 g 6  . glimepiride (AMARYL) 4 MG tablet Take 1 tablet (4 mg total) by mouth daily before breakfast. 90 tablet 3  . glucose blood (CONTOUR NEXT TEST) test strip 1 each by Other route daily. And lancets 1/day 100 each 3  . lisinopril-hydrochlorothiazide (ZESTORETIC) 20-12.5 MG tablet Take 1 tablet by mouth daily. 90 tablet 1  . metFORMIN (GLUCOPHAGE-XR) 500 MG 24 hr tablet Take 2 tablets (1,000 mg total) by mouth every morning. 180 tablet 3  . Polyethyl Glycol-Propyl Glycol (SYSTANE)  0.4-0.3 % SOLN Place 1-2 drops into both eyes 3 (three) times daily as needed (for dry eyes.).    Marland Kitchen simvastatin (ZOCOR) 40 MG tablet Take 1 tablet (40 mg total) by mouth at bedtime. 90 tablet 1  . sitaGLIPtin (JANUVIA) 100 MG tablet Take 1 tablet (100 mg total) by mouth daily. 90 tablet 3  . zolpidem (AMBIEN) 5 MG tablet Take 1 tablet (5 mg total) by mouth at bedtime as needed for sleep. 15 tablet 0   No current facility-administered medications on file prior to visit.    No Known Allergies  Family History  Problem Relation Age of Onset  . Colon polyps Maternal Aunt   . Colon polyps Paternal Uncle   . Colon cancer Maternal Aunt        in 37s when diagnosed.   Marland Kitchen Heart disease Father   . Diabetes Father   . Diabetes Mother   . Diabetes Sister   . Diabetes Brother   . Heart disease Brother   . Cancer Maternal Aunt        Pancreatic    BP 140/70 (BP Location: Left Arm, Patient Position: Sitting, Cuff Size: Normal)   Pulse 88   Ht 5\' 5"  (1.651 m)   Wt 158 lb 12.8 oz (72 kg)   SpO2 92%   BMI 26.43 kg/m    Review of Systems She denies hypoglycemia.      Objective:   Physical Exam VITAL SIGNS:  See vs page GENERAL: no distress Pulses: dorsalis pedis intact bilat.   MSK: no deformity of the feet CV: no leg edema Skin:  no ulcer on the feet.  normal color and temp on the feet.   Neuro: sensation is intact to touch on the feet.    Lab Results  Component Value Date   HGBA1C 10.0 (A) 05/30/2019   Lab Results  Component Value Date   CREATININE 0.77 05/14/2019   BUN 7 05/14/2019   NA 137 05/14/2019   K 4.3 05/14/2019   CL 97 05/14/2019   CO2 30 05/14/2019       Assessment & Plan:  Type 2 DM, with PN: worse SDOH: she refuses insulin.  I advised pt of risks.  HTN: recheck next time.   Patient Instructions  I have sent 2 prescriptions to your pharmacy: to resume Invokana, and to add "Rybelsus."  Please continue the same other diabetes medications.   check your  blood sugar once a day.  vary the time of day when you check, between before the 3 meals, and at bedtime.  also check if you have symptoms of your blood sugar being too high or too low.  please keep a record of the readings  and bring it to your next appointment here (or you can bring the meter itself).  You can write it on any piece of paper.  please call us sooner if your blood sugar goes below 70, or if you have a lot of readings over 200.   Please come back for a follow-up appointment in 2 months.

## 2019-07-22 ENCOUNTER — Other Ambulatory Visit: Payer: Self-pay | Admitting: Endocrinology

## 2019-08-03 ENCOUNTER — Ambulatory Visit: Payer: BC Managed Care – PPO | Admitting: Endocrinology

## 2019-08-09 ENCOUNTER — Other Ambulatory Visit: Payer: Self-pay

## 2019-08-10 ENCOUNTER — Ambulatory Visit: Payer: BC Managed Care – PPO | Admitting: Endocrinology

## 2019-08-13 ENCOUNTER — Encounter: Payer: Self-pay | Admitting: Endocrinology

## 2019-08-13 ENCOUNTER — Other Ambulatory Visit: Payer: Self-pay

## 2019-08-13 ENCOUNTER — Ambulatory Visit: Payer: BC Managed Care – PPO | Admitting: Endocrinology

## 2019-08-13 VITALS — BP 100/60 | HR 87 | Ht 65.0 in | Wt 156.0 lb

## 2019-08-13 DIAGNOSIS — E1142 Type 2 diabetes mellitus with diabetic polyneuropathy: Secondary | ICD-10-CM | POA: Diagnosis not present

## 2019-08-13 LAB — POCT GLYCOSYLATED HEMOGLOBIN (HGB A1C): Hemoglobin A1C: 8.7 % — AB (ref 4.0–5.6)

## 2019-08-13 MED ORDER — RYBELSUS 3 MG PO TABS: 3.0000 mg | ORAL_TABLET | Freq: Every day | ORAL | 11 refills | Status: DC

## 2019-08-13 NOTE — Progress Notes (Signed)
Subjective:    Patient ID: Dana Payne, female    DOB: Sep 14, 1965, 54 y.o.   MRN: [id number]  HPI Pt returns for f/u of diabetes mellitus:  DM type: 2 Dx'ed: 123456 Complications: polyneuropathy.   Therapy: 4 oral meds.   GDM: never DKA: never Severe hypoglycemia: never Pancreatitis: never.   Pancreatic imaging: normal on 2012 CT.   SDOH: she refuses insulin.   Other: none Interval history: no cbg record, but states cbg's vary from 135-238.  pt states she feels well in general.  pt reports she takes meds as rx'ed. She did not take Rybelsus or Invokana.   Past Medical History:  Diagnosis Date  . Diabetes mellitus   . GERD (gastroesophageal reflux disease)   . HTN (hypertension)   . Hypercholesteremia   . Hypercholesterolemia   . Migraines   . Type II diabetes mellitus ([place])     Past Surgical History:  Procedure Laterality Date  . ABDOMINAL HYSTERECTOMY  2008   Leiomyoma/menorrhagia  . COLONOSCOPY  2012  . COLONOSCOPY N/A 04/21/2017   Procedure: COLONOSCOPY;  Surgeon: Daneil Dolin, MD;  Location: [facility];  Service: Endoscopy;  Laterality: N/A;  2:15pm  . LASER ABLATION OF THE CERVIX     dysplasia    Social History   Socioeconomic History  . Marital status: Single    Spouse name: Not on file  . Number of children: Not on file  . Years of education: Not on file  . Highest education level: Not on file  Occupational History  . Not on file  Tobacco Use  . Smoking status: Never Smoker  . Smokeless tobacco: Never Used  Substance and Sexual Activity  . Alcohol use: No    Alcohol/week: 0.0 standard drinks  . Drug use: No  . Sexual activity: Yes    Birth control/protection: None    Comment: HYSTERECTOMY-1st intercourse 48 yo-5 partners  Other Topics Concern  . 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   Social Determinants of Health   Financial Resource Strain:   . Difficulty of  Paying Living Expenses:   Food Insecurity:   . Worried About Charity fundraiser in the Last Year:   . Arboriculturist in the Last Year:   Transportation Needs:   . Film/video editor (Medical):   Marland Kitchen Lack of Transportation (Non-Medical):   Physical Activity:   . Days of Exercise per Week:   . Minutes of Exercise per Session:   Stress:   . Feeling of Stress :   Social Connections:   . Frequency of Communication with Friends and Family:   . Frequency of Social Gatherings with Friends and Family:   . Attends Religious Services:   . Active Member of Clubs or Organizations:   . Attends Archivist Meetings:   Marland Kitchen Marital Status:   Intimate Partner Violence:   . Fear of Current or Ex-Partner:   . Emotionally Abused:   Marland Kitchen Physically Abused:   . Sexually Abused:     Current Outpatient Medications on File Prior to Visit  Medication Sig Dispense Refill  . acetaminophen (TYLENOL) 500 MG tablet Take 500 mg by mouth every 6 (six) hours as needed (for headache.).     Marland Kitchen aspirin EC 81 MG tablet Take 81 mg by mouth daily.    . fluticasone (FLONASE) 50 MCG/ACT nasal spray Place 2 sprays into both nostrils daily. (Patient taking  differently: Place 2 sprays into both nostrils daily as needed (for summer/hay fever allergies). ) 16 g 6  . glimepiride (AMARYL) 2 MG tablet Take 2 tablets (4 mg total) by mouth daily with breakfast. 60 tablet 2  . glucose blood (CONTOUR NEXT TEST) test strip 1 each by Other route daily. And lancets 1/day 100 each 3  . lisinopril-hydrochlorothiazide (ZESTORETIC) 20-12.5 MG tablet Take 1 tablet by mouth daily. 90 tablet 1  . metFORMIN (GLUCOPHAGE-XR) 500 MG 24 hr tablet Take 2 tablets (1,000 mg total) by mouth every morning. 180 tablet 3  . Polyethyl Glycol-Propyl Glycol (SYSTANE) 0.4-0.3 % SOLN Place 1-2 drops into both eyes 3 (three) times daily as needed (for dry eyes.).    Marland Kitchen simvastatin (ZOCOR) 40 MG tablet Take 1 tablet (40 mg total) by mouth at bedtime. 90  tablet 1  . sitaGLIPtin (JANUVIA) 100 MG tablet Take 1 tablet (100 mg total) by mouth daily. 90 tablet 3  . zolpidem (AMBIEN) 5 MG tablet Take 1 tablet (5 mg total) by mouth at bedtime as needed for sleep. 15 tablet 0   No current facility-administered medications on file prior to visit.    No Known Allergies  Family History  Problem Relation Age of Onset  . Colon polyps Maternal Aunt   . Colon polyps Paternal Uncle   . Colon cancer Maternal Aunt        in 83s when diagnosed.   Marland Kitchen Heart disease Father   . Diabetes Father   . Diabetes Mother   . Diabetes Sister   . Diabetes Brother   . Heart disease Brother   . Cancer Maternal Aunt        Pancreatic    BP 100/60   Pulse 87   Ht 5\' 5"  (1.651 m)   Wt 156 lb (70.8 kg)   SpO2 93%   BMI 25.96 kg/m    Review of Systems Denies nausea.      Objective:   Physical Exam VITAL SIGNS:  See vs page GENERAL: no distress Pulses: dorsalis pedis intact bilat.   MSK: no deformity of the feet CV: no leg edema Skin:  no ulcer on the feet.  normal color and temp on the feet. Neuro: sensation is intact to touch on the feet  Lab Results  Component Value Date   HGBA1C 8.7 (A) 08/13/2019        Assessment & Plan:  Type 2 DM, with PN: she needs increased rx. She prob needs to add 2 meds to get A1c below 7 Noncompliance with meds.  she declines Invokana, at least for now.   Patient Instructions  I have sent a prescription to your pharmacy, to add "Rybelsus."  Please continue the same other diabetes medications.   check your blood sugar once a day.  vary the time of day when you check, between before the 3 meals, and at bedtime.  also check if you have symptoms of your blood sugar being too high or too low.  please keep a record of the readings and bring it to your next appointment here (or you can bring the meter itself).  You can write it on any piece of paper.  please call us sooner if your blood sugar goes below 70, or if you have a  lot of readings over 200.   Please come back for a follow-up appointment in 2 months.

## 2019-08-13 NOTE — Patient Instructions (Addendum)
I have sent a prescription to your pharmacy, to add Rybelsus.   °Please continue the same other diabetes medications.   °check your blood sugar once a day.  vary the time of day when you check, between before the 3 meals, and at bedtime.  also check if you have symptoms of your blood sugar being too high or too low.  please keep a record of the readings and bring it to your next appointment here (or you can bring the meter itself).  You can write it on any piece of paper.  please call us sooner if your blood sugar goes below 70, or if you have a lot of readings over 200.   °Please come back for a follow-up appointment in 2 months.    °

## 2019-09-14 ENCOUNTER — Other Ambulatory Visit: Payer: Self-pay | Admitting: Family Medicine

## 2019-09-14 DIAGNOSIS — I10 Essential (primary) hypertension: Secondary | ICD-10-CM

## 2019-10-12 ENCOUNTER — Ambulatory Visit: Payer: BC Managed Care – PPO | Admitting: Family Medicine

## 2019-10-24 ENCOUNTER — Ambulatory Visit (INDEPENDENT_AMBULATORY_CARE_PROVIDER_SITE_OTHER): Payer: BC Managed Care – PPO | Admitting: Family Medicine

## 2019-10-24 ENCOUNTER — Encounter: Payer: Self-pay | Admitting: Family Medicine

## 2019-10-24 ENCOUNTER — Other Ambulatory Visit: Payer: Self-pay

## 2019-10-24 VITALS — BP 108/78 | HR 69 | Temp 97.9°F | Resp 16 | Ht 65.0 in | Wt 159.1 lb

## 2019-10-24 DIAGNOSIS — I10 Essential (primary) hypertension: Secondary | ICD-10-CM | POA: Diagnosis not present

## 2019-10-24 DIAGNOSIS — Z8639 Personal history of other endocrine, nutritional and metabolic disease: Secondary | ICD-10-CM

## 2019-10-24 DIAGNOSIS — Z0000 Encounter for general adult medical examination without abnormal findings: Secondary | ICD-10-CM

## 2019-10-24 LAB — BASIC METABOLIC PANEL
BUN: 10 mg/dL (ref 6–23)
CO2: 29 mEq/L (ref 19–32)
Calcium: 9.9 mg/dL (ref 8.4–10.5)
Chloride: 100 mEq/L (ref 96–112)
Creatinine, Ser: 0.76 mg/dL (ref 0.40–1.20)
GFR: 95.63 mL/min (ref >60.00)
Glucose, Bld: 241 mg/dL — ABNORMAL HIGH (ref 70–99)
Potassium: 4.3 mEq/L (ref 3.5–5.1)
Sodium: 138 mEq/L (ref 135–145)

## 2019-10-24 LAB — CBC WITH DIFFERENTIAL/PLATELET
Basophils Absolute: 0 10*3/uL (ref 0.0–0.1)
Basophils Relative: 0.7 % (ref 0.0–3.0)
Eosinophils Absolute: 0.1 10*3/uL (ref 0.0–0.7)
Eosinophils Relative: 1.8 % (ref 0.0–5.0)
HCT: 40.1 % (ref 36.0–46.0)
Hemoglobin: 13.9 g/dL (ref 12.0–15.0)
Lymphocytes Relative: 27.1 % (ref 12.0–46.0)
Lymphs Abs: 1.7 10*3/uL (ref 0.7–4.0)
MCHC: 34.7 g/dL (ref 30.0–36.0)
MCV: 85.9 fl (ref 78.0–100.0)
Monocytes Absolute: 0.4 10*3/uL (ref 0.1–1.0)
Monocytes Relative: 6.6 % (ref 3.0–12.0)
Neutro Abs: 4 10*3/uL (ref 1.4–7.7)
Neutrophils Relative %: 63.8 % (ref 43.0–77.0)
Platelets: 231 10*3/uL (ref 150.0–400.0)
RBC: 4.67 Mil/uL (ref 3.87–5.11)
RDW: 12.5 % (ref 11.5–15.5)
WBC: 6.3 10*3/uL (ref 4.0–10.5)

## 2019-10-24 LAB — HEPATIC FUNCTION PANEL
ALT: 20 U/L (ref 0–35)
AST: 16 U/L (ref 0–37)
Albumin: 4.7 g/dL (ref 3.5–5.2)
Alkaline Phosphatase: 71 U/L (ref 39–117)
Bilirubin, Direct: 0.1 mg/dL (ref 0.0–0.3)
Total Bilirubin: 0.5 mg/dL (ref 0.2–1.2)
Total Protein: 6.8 g/dL (ref 6.0–8.3)

## 2019-10-24 LAB — VITAMIN D 25 HYDROXY (VIT D DEFICIENCY, FRACTURES): VITD: 17.22 ng/mL — ABNORMAL LOW (ref 30.00–100.00)

## 2019-10-24 LAB — LIPID PANEL
Cholesterol: 151 mg/dL (ref 0–200)
HDL: 39.7 mg/dL (ref >39.00)
NonHDL: 111.09
Total CHOL/HDL Ratio: 4
Triglycerides: 204 mg/dL — ABNORMAL HIGH (ref 0.0–149.0)
VLDL: 40.8 mg/dL — ABNORMAL HIGH (ref 0.0–40.0)

## 2019-10-24 LAB — LDL CHOLESTEROL, DIRECT: Direct LDL: 84 mg/dL

## 2019-10-24 LAB — TSH: TSH: 1 u[IU]/mL (ref 0.35–4.50)

## 2019-10-24 NOTE — Assessment & Plan Note (Signed)
Pt's PE unchanged from previous and WNL w/ exception of goiter.  UTD on GYN, colonoscopy, immunizations.  Check labs.  Anticipatory guidance provided.

## 2019-10-24 NOTE — Progress Notes (Signed)
   Subjective:    Patient ID: Dana Payne, female    DOB: [birth year], 54 y.o.   MRN: [id number]  HPI CPE- UTD on pap, mammo, colonoscopy, eye exam.  UTD on Tdap, COVID vaccines.   Review of Systems Patient reports no vision/ hearing changes, adenopathy,fever, weight change,  persistant/recurrent hoarseness , swallowing issues, chest pain, palpitations, edema, persistant/recurrent cough, hemoptysis, dyspnea (rest/exertional/paroxysmal nocturnal), gastrointestinal bleeding (melena, rectal bleeding), abdominal pain, significant heartburn, bowel changes, GU symptoms (dysuria, hematuria, incontinence), Gyn symptoms (abnormal  bleeding, pain),  syncope, focal weakness, memory loss, skin/hair/nail changes, abnormal bruising or bleeding, anxiety, or depression.   This visit occurred during the SARS-CoV-2 public health emergency.  Safety protocols were in place, including screening questions prior to the visit, additional usage of staff PPE, and extensive cleaning of exam room while observing appropriate contact time as indicated for disinfecting solutions.       Objective:   Physical Exam General Appearance:    Alert, cooperative, no distress, appears stated age  Head:    Normocephalic, without obvious abnormality, atraumatic  Eyes:    PERRL, conjunctiva/corneas clear, EOM's intact, fundi    benign, both eyes  Ears:    Normal TM's and external ear canals, both ears  Nose:   Deferred due to COVID  Throat:   Neck:   Supple, symmetrical, trachea midline, no adenopathy;    Thyroid: known goiter  Back:     Symmetric, no curvature, ROM normal, no CVA tenderness  Lungs:     Clear to auscultation bilaterally, respirations unlabored  Chest Wall:    No tenderness or deformity   Heart:    Regular rate and rhythm, S1 and S2 normal, no murmur, rub   or gallop  Breast Exam:    Deferred to GYN  Abdomen:     Soft, non-tender, bowel sounds active all four quadrants,    no masses, no organomegaly    Genitalia:    Deferred to GYN  Rectal:    Extremities:   Extremities normal, atraumatic, no cyanosis or edema  Pulses:   2+ and symmetric all extremities  Skin:   Skin color, texture, turgor normal, no rashes or lesions  Lymph nodes:   Cervical, supraclavicular, and axillary nodes normal  Neurologic:   CNII-XII intact, normal strength, sensation and reflexes    throughout          Assessment & Plan:

## 2019-10-24 NOTE — Patient Instructions (Signed)
Follow up in 6 months to recheck cholesterol We'll notify you of your lab results and make any changes if needed Continue to work on healthy diet and regular exercise- you can do it! Get your mammogram in August (before you lose your insurance) Schedule an appt w/ Dr Loanne Drilling for July Call with any questions or concerns Have a great summer!!!

## 2019-10-24 NOTE — Assessment & Plan Note (Signed)
Chronic problem.  Well controlled.  Currently asymptomatic.  Check labs.  No anticipated med changes.  Will follow. 

## 2019-10-24 NOTE — Assessment & Plan Note (Signed)
Check labs and replete prn. 

## 2019-10-25 ENCOUNTER — Other Ambulatory Visit: Payer: Self-pay | Admitting: General Practice

## 2019-10-25 MED ORDER — VITAMIN D (ERGOCALCIFEROL) 1.25 MG (50000 UNIT) PO CAPS
50000.0000 [IU] | ORAL_CAPSULE | ORAL | 0 refills | Status: DC
Start: 2019-10-25 — End: 2021-06-24

## 2019-10-25 NOTE — Progress Notes (Signed)
Called pt and lmovm to return call.

## 2019-10-26 ENCOUNTER — Other Ambulatory Visit: Payer: Self-pay | Admitting: General Practice

## 2019-12-07 DIAGNOSIS — Z1231 Encounter for screening mammogram for malignant neoplasm of breast: Secondary | ICD-10-CM | POA: Diagnosis not present

## 2019-12-07 LAB — HM MAMMOGRAPHY

## 2019-12-08 ENCOUNTER — Other Ambulatory Visit: Payer: Self-pay | Admitting: Endocrinology

## 2019-12-09 NOTE — Telephone Encounter (Signed)
1.  Please schedule f/u appt 2.  Then please refill x 2 mos, pending that appt.  

## 2019-12-14 ENCOUNTER — Encounter: Payer: BC Managed Care – PPO | Admitting: Obstetrics and Gynecology

## 2019-12-17 ENCOUNTER — Other Ambulatory Visit: Payer: Self-pay | Admitting: Endocrinology

## 2019-12-18 ENCOUNTER — Telehealth: Payer: Self-pay | Admitting: Family Medicine

## 2019-12-18 DIAGNOSIS — I10 Essential (primary) hypertension: Secondary | ICD-10-CM

## 2019-12-18 MED ORDER — LISINOPRIL-HYDROCHLOROTHIAZIDE 20-12.5 MG PO TABS: 1.0000 | ORAL_TABLET | Freq: Every day | ORAL | 1 refills | Status: DC

## 2019-12-18 MED ORDER — SIMVASTATIN 40 MG PO TABS: 40.0000 mg | ORAL_TABLET | Freq: Every day | ORAL | 1 refills | Status: DC

## 2019-12-18 NOTE — Telephone Encounter (Signed)
Pt isn't due to come in for an office visit until Dec. She needs new scripts of the Lisinopril and simvastatin sent to the [facility] in [place].  She also wanted to know if Dr. Birdie Riddle wanted her to continue taking the Vit. D if so she needs that called in as well.  Please advise   Last OV was 10/24/19

## 2019-12-18 NOTE — Telephone Encounter (Signed)
Medication filled to pharmacy as requested.  Vitamin D was not refilled as she should be on 2000iu daily OTC.

## 2019-12-24 DIAGNOSIS — S6992XA Unspecified injury of left wrist, hand and finger(s), initial encounter: Secondary | ICD-10-CM | POA: Diagnosis not present

## 2019-12-25 ENCOUNTER — Ambulatory Visit: Payer: BC Managed Care – PPO | Admitting: Endocrinology

## 2019-12-28 ENCOUNTER — Ambulatory Visit: Payer: BC Managed Care – PPO | Admitting: Endocrinology

## 2019-12-28 ENCOUNTER — Other Ambulatory Visit: Payer: Self-pay

## 2019-12-28 ENCOUNTER — Encounter: Payer: Self-pay | Admitting: Endocrinology

## 2019-12-28 VITALS — BP 122/74 | HR 97 | Ht 65.0 in | Wt 159.6 lb

## 2019-12-28 DIAGNOSIS — E1142 Type 2 diabetes mellitus with diabetic polyneuropathy: Secondary | ICD-10-CM | POA: Diagnosis not present

## 2019-12-28 LAB — POCT GLYCOSYLATED HEMOGLOBIN (HGB A1C): Hemoglobin A1C: 10.2 % — AB (ref 4.0–5.6)

## 2019-12-28 MED ORDER — RYBELSUS 7 MG PO TABS: 7.0000 mg | ORAL_TABLET | Freq: Every day | ORAL | 11 refills | Status: DC

## 2019-12-28 NOTE — Patient Instructions (Addendum)
I have sent a prescription to your pharmacy, to change Januvia to Rybelsus. Please continue the same other diabetes medications.   check your blood sugar once a day.  vary the time of day when you check, between before the 3 meals, and at bedtime.  also check if you have symptoms of your blood sugar being too high or too low.  please keep a record of the readings and bring it to your next appointment here (or you can bring the meter itself).  You can write it on any piece of paper.  please call us sooner if your blood sugar goes below 70, or if you have a lot of readings over 200.   Please come back for a follow-up appointment in 2 months.

## 2019-12-28 NOTE — Progress Notes (Signed)
Subjective:    Patient ID: Dana Payne, female    DOB: 07-21-1965, 54 y.o.   MRN: [id number]  HPI Pt returns for f/u of diabetes mellitus:  DM type: 2 Dx'ed: 6063 Complications: polyneuropathy.   Therapy: 4 oral meds.   GDM: never DKA: never Severe hypoglycemia: never Pancreatitis: never.   Pancreatic imaging: normal on 2012 CT.   SDOH: she refuses insulin.   Other: none Interval history: no cbg record, but states cbg's vary from 152-258.  pt states she feels well in general.  pt reports she takes meds as rx'ed. She did not take Rybelsus.  She says Januvia is expensive.  Past Medical History:  Diagnosis Date  . Diabetes mellitus   . GERD (gastroesophageal reflux disease)   . HTN (hypertension)   . Hypercholesteremia   . Hypercholesterolemia   . Migraines   . Type II diabetes mellitus ([place])     Past Surgical History:  Procedure Laterality Date  . ABDOMINAL HYSTERECTOMY  2008   Leiomyoma/menorrhagia  . COLONOSCOPY  2012  . COLONOSCOPY N/A 04/21/2017   Procedure: COLONOSCOPY;  Surgeon: Daneil Dolin, MD;  [facility];  Service: Endoscopy;  Laterality: N/A;  2:15pm  . LASER ABLATION OF THE CERVIX     dysplasia    Social History   Socioeconomic History  . Marital status: Single    Spouse name: Not on file  . Number of children: Not on file  . Years of education: Not on file  . Highest education level: Not on file  Occupational History  . Not on file  Tobacco Use  . Smoking status: Never Smoker  . Smokeless tobacco: Never Used  Vaping Use  . Vaping Use: Never used  Substance and Sexual Activity  . Alcohol use: No    Alcohol/week: 0.0 standard drinks  . Drug use: No  . Sexual activity: Yes    Birth control/protection: None    Comment: HYSTERECTOMY-1st intercourse 23 yo-5 partners  Other Topics Concern  . 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   Social Determinants of  Health   Financial Resource Strain:   . Difficulty of Paying Living Expenses: Not on file  Food Insecurity:   . Worried About Charity fundraiser in the Last Year: Not on file  . Ran Out of Food in the Last Year: Not on file  Transportation Needs:   . Lack of Transportation (Medical): Not on file  . Lack of Transportation (Non-Medical): Not on file  Physical Activity:   . Days of Exercise per Week: Not on file  . Minutes of Exercise per Session: Not on file  Stress:   . Feeling of Stress : Not on file  Social Connections:   . Frequency of Communication with Friends and Family: Not on file  . Frequency of Social Gatherings with Friends and Family: Not on file  . Attends Religious Services: Not on file  . Active Member of Clubs or Organizations: Not on file  . Attends Archivist Meetings: Not on file  . Marital Status: Not on file  Intimate Partner Violence:   . Fear of Current or Ex-Partner: Not on file  . Emotionally Abused: Not on file  . Physically Abused: Not on file  . Sexually Abused: Not on file    Current Outpatient Medications on File Prior to Visit  Medication Sig Dispense Refill  . acetaminophen (TYLENOL) 500 MG tablet  Take 500 mg by mouth every 6 (six) hours as needed (for headache.).     Marland Kitchen aspirin EC 81 MG tablet Take 81 mg by mouth daily.    . fluticasone (FLONASE) 50 MCG/ACT nasal spray Place 2 sprays into both nostrils daily. (Patient taking differently: Place 2 sprays into both nostrils daily as needed (for summer/hay fever allergies). ) 16 g 6  . glimepiride (AMARYL) 2 MG tablet Take 2 tablets (4 mg total) by mouth daily with breakfast. 60 tablet 2  . glucose blood (CONTOUR NEXT TEST) test strip 1 each by Other route daily. And lancets 1/day 100 each 3  . lisinopril-hydrochlorothiazide (ZESTORETIC) 20-12.5 MG tablet Take 1 tablet by mouth daily. 90 tablet 1  . metFORMIN (GLUCOPHAGE-XR) 500 MG 24 hr tablet TAKE 2 TABLETS BY MOUTH IN THE MORNING 180  tablet 0  . Polyethyl Glycol-Propyl Glycol (SYSTANE) 0.4-0.3 % SOLN Place 1-2 drops into both eyes 3 (three) times daily as needed (for dry eyes.).    Marland Kitchen simvastatin (ZOCOR) 40 MG tablet Take 1 tablet (40 mg total) by mouth at bedtime. 90 tablet 1  . Vitamin D, Ergocalciferol, (DRISDOL) 1.25 MG (50000 UNIT) CAPS capsule Take 1 capsule (50,000 Units total) by mouth every 7 (seven) days. 12 capsule 0  . zolpidem (AMBIEN) 5 MG tablet Take 1 tablet (5 mg total) by mouth at bedtime as needed for sleep. 15 tablet 0   No current facility-administered medications on file prior to visit.    No Known Allergies  Family History  Problem Relation Age of Onset  . Colon polyps Maternal Aunt   . Colon polyps Paternal Uncle   . Colon cancer Maternal Aunt        in 52s when diagnosed.   Marland Kitchen Heart disease Father   . Diabetes Father   . Diabetes Mother   . Diabetes Sister   . Diabetes Brother   . Heart disease Brother   . Cancer Maternal Aunt        Pancreatic    BP 122/74   Pulse 97   Ht 5\' 5"  (1.651 m)   Wt 159 lb 9.6 oz (72.4 kg)   SpO2 95%   BMI 26.56 kg/m    Review of Systems She denies hypoglycemia and nausea.      Objective:   Physical Exam VITAL SIGNS:  See vs page GENERAL: no distress Pulses: dorsalis pedis intact bilat.   MSK: no deformity of the feet CV: no leg edema Skin:  no ulcer on the feet.  normal color and temp on the feet. Neuro: sensation is intact to touch on the feet.     Lab Results  Component Value Date   HGBA1C 10.2 (A) 12/28/2019       Assessment & Plan:  Type 2 DM, with PN: worse  Patient Instructions  I have sent a prescription to your pharmacy, to change Januvia to Rybelsus. Please continue the same other diabetes medications.   check your blood sugar once a day.  vary the time of day when you check, between before the 3 meals, and at bedtime.  also check if you have symptoms of your blood sugar being too high or too low.  please keep a record of  the readings and bring it to your next appointment here (or you can bring the meter itself).  You can write it on any piece of paper.  please call us sooner if your blood sugar goes below 70, or if you have a  lot of readings over 200.   Please come back for a follow-up appointment in 2 months.

## 2020-01-04 ENCOUNTER — Encounter: Payer: BC Managed Care – PPO | Admitting: Obstetrics and Gynecology

## 2020-01-09 ENCOUNTER — Encounter: Payer: Self-pay | Admitting: General Practice

## 2020-01-17 DIAGNOSIS — M25579 Pain in unspecified ankle and joints of unspecified foot: Secondary | ICD-10-CM | POA: Diagnosis not present

## 2020-01-17 DIAGNOSIS — M1990 Unspecified osteoarthritis, unspecified site: Secondary | ICD-10-CM | POA: Diagnosis not present

## 2020-01-17 DIAGNOSIS — M79672 Pain in left foot: Secondary | ICD-10-CM | POA: Diagnosis not present

## 2020-01-17 DIAGNOSIS — M79675 Pain in left toe(s): Secondary | ICD-10-CM | POA: Diagnosis not present

## 2020-01-21 ENCOUNTER — Other Ambulatory Visit: Payer: Self-pay | Admitting: Endocrinology

## 2020-01-23 ENCOUNTER — Other Ambulatory Visit: Payer: Self-pay

## 2020-01-23 ENCOUNTER — Ambulatory Visit (INDEPENDENT_AMBULATORY_CARE_PROVIDER_SITE_OTHER): Payer: BC Managed Care – PPO | Admitting: Obstetrics and Gynecology

## 2020-01-23 ENCOUNTER — Encounter: Payer: Self-pay | Admitting: Obstetrics and Gynecology

## 2020-01-23 VITALS — BP 118/66 | Ht 64.0 in | Wt 161.0 lb

## 2020-01-23 DIAGNOSIS — L989 Disorder of the skin and subcutaneous tissue, unspecified: Secondary | ICD-10-CM

## 2020-01-23 DIAGNOSIS — Z01419 Encounter for gynecological examination (general) (routine) without abnormal findings: Secondary | ICD-10-CM | POA: Diagnosis not present

## 2020-01-23 NOTE — Progress Notes (Signed)
Dana Payne [birth year] [id number]  SUBJECTIVE:  54 y.o. G0P0 female for annual routine gynecologic exam. She has no gynecologic concerns.  Current Outpatient Medications  Medication Sig Dispense Refill  . acetaminophen (TYLENOL) 500 MG tablet Take 500 mg by mouth every 6 (six) hours as needed (for headache.).     Marland Kitchen aspirin EC 81 MG tablet Take 81 mg by mouth daily.    . fluticasone (FLONASE) 50 MCG/ACT nasal spray Place 2 sprays into both nostrils daily. (Patient taking differently: Place 2 sprays into both nostrils daily as needed (for summer/hay fever allergies). ) 16 g 6  . glimepiride (AMARYL) 2 MG tablet Take 2 tablets (4 mg total) by mouth daily with breakfast. 60 tablet 2  . glucose blood (CONTOUR NEXT TEST) test strip 1 each by Other route daily. And lancets 1/day 100 each 3  . lisinopril-hydrochlorothiazide (ZESTORETIC) 20-12.5 MG tablet Take 1 tablet by mouth daily. 90 tablet 1  . metFORMIN (GLUCOPHAGE-XR) 500 MG 24 hr tablet TAKE 2 TABLETS BY MOUTH IN THE MORNING 180 tablet 0  . Polyethyl Glycol-Propyl Glycol (SYSTANE) 0.4-0.3 % SOLN Place 1-2 drops into both eyes 3 (three) times daily as needed (for dry eyes.).    Marland Kitchen Semaglutide (RYBELSUS) 7 MG TABS Take 7 mg by mouth daily. 30 tablet 11  . simvastatin (ZOCOR) 40 MG tablet Take 1 tablet (40 mg total) by mouth at bedtime. 90 tablet 1  . Vitamin D, Ergocalciferol, (DRISDOL) 1.25 MG (50000 UNIT) CAPS capsule Take 1 capsule (50,000 Units total) by mouth every 7 (seven) days. 12 capsule 0   No current facility-administered medications for this visit.   Allergies: Patient has no known allergies.  No LMP recorded. Patient has had a hysterectomy.  Past medical history,surgical history, problem list, medications, allergies, family history and social history were all reviewed and documented as reviewed in the EPIC chart.  ROS:  Feeling well. No dyspnea or chest pain on exertion.  No abdominal pain, change in bowel habits, black or  bloody stools.  No urinary tract symptoms. GYN ROS: no abnormal bleeding, pelvic pain or discharge, no breast pain or new or enlarging lumps on self exam.No neurological complaints.   OBJECTIVE:  BP 118/66   Ht 5\' 4"  (1.626 m)   Wt 161 lb (73 kg)   BMI 27.64 kg/m  The patient appears well, alert, oriented, in no distress. Abdomen soft without tenderness, guarding, mass or organomegaly.  Neurological is normal, no focal findings.  BREAST EXAM: breasts appear normal, no suspicious masses, no skin or nipple changes or axillary nodes  PELVIC EXAM: VULVA: Morphologically normal appearing vulva with blanching anteriorly bilaterally, trophic change, no masses, tenderness or lesions, VAGINA: normal appearing vagina with atrophic change, normal color and discharge, no lesions, CERVIX: surgically absent, UTERUS: surgically absent, vaginal cuff normal, ADNEXA: no masses, nontender  Chaperone: Caryn Bee present during the examination  ASSESSMENT:  54 y.o. G0P0 here for annual gynecologic exam  PLAN:   1. Postmenopausal. Prior TAH for leiomyoma 2008.  Had some vaginal bleeding a few years ago with a negative evaluation, no bleeding since then.  No significant hot flashes or night sweats. 2. Pap smear 2019.  Prior laser treatment of cervix a number of years ago for unknown grade of dysplasia.  Next Pap smear due 2022 following the current guidelines recommending the 3 year interval. 3.  Vulvar skin changes.  We discussed the finding of blanching on today's exam.  We discussed possible lichen sclerosis based on visual  appearance.  If she develops any persistent/severe itching in the area she should let us know as she may need topical therapy. 4. Mammogram 11/2019.  Normal breast exam today.  She will continue with annual mammograms. 5. Colonoscopy 2018.  She will follow up with the interval recommended by her GI specialist. 6. DEXA never.  Would recommend further intomenopause. 7. Health maintenance.   No labs today as she normally has these completed elsewhere.  Return annually or sooner, prn.  Joseph Pierini MD 01/23/20

## 2020-01-24 ENCOUNTER — Telehealth: Payer: Self-pay

## 2020-01-24 ENCOUNTER — Other Ambulatory Visit: Payer: Self-pay | Admitting: Endocrinology

## 2020-01-24 MED ORDER — GLIMEPIRIDE 2 MG PO TABS
4.0000 mg | ORAL_TABLET | Freq: Every day | ORAL | 2 refills | Status: DC
Start: 2020-01-24 — End: 2020-11-17

## 2020-01-24 MED ORDER — RYBELSUS 7 MG PO TABS: 7.0000 mg | ORAL_TABLET | Freq: Every day | ORAL | 1 refills | Status: DC

## 2020-01-24 NOTE — Addendum Note (Signed)
Addended by: Amado Coe on: 01/24/2020 04:30 PM   Modules accepted: Orders

## 2020-01-24 NOTE — Telephone Encounter (Signed)
Please continue the same metformin and glimepiride. D/c rybelsus Ret next avail appt

## 2020-01-24 NOTE — Telephone Encounter (Addendum)
Pt called says insurance ends at midnight tonight & pt will not be able to afford to fill medications. Please call in and approve today jenuvia tablets & "the new mediation Loanne Drilling said he was going to give & said it may make pt sick"  Has not been called in yet. Please call pt [phone] to confirm what meds she is asking for.

## 2020-01-25 NOTE — Telephone Encounter (Signed)
Spoke with patient and discussed medications.

## 2020-04-01 ENCOUNTER — Encounter: Payer: Self-pay | Admitting: Endocrinology

## 2020-04-01 ENCOUNTER — Other Ambulatory Visit: Payer: Self-pay

## 2020-04-01 ENCOUNTER — Ambulatory Visit: Payer: Self-pay | Admitting: Endocrinology

## 2020-04-01 VITALS — BP 118/80 | HR 86 | Ht 65.0 in | Wt 156.2 lb

## 2020-04-01 DIAGNOSIS — E1142 Type 2 diabetes mellitus with diabetic polyneuropathy: Secondary | ICD-10-CM

## 2020-04-01 LAB — POCT GLYCOSYLATED HEMOGLOBIN (HGB A1C): Hemoglobin A1C: 8.5 % — AB (ref 4.0–5.6)

## 2020-04-01 MED ORDER — PIOGLITAZONE HCL 45 MG PO TABS: 45.0000 mg | ORAL_TABLET | Freq: Every day | ORAL | 11 refills | Status: DC

## 2020-04-01 NOTE — Progress Notes (Signed)
Subjective:    Patient ID: Dana Payne, female    DOB: [birth year], 54 y.o.   MRN: [id number]  HPI Pt returns for f/u of diabetes mellitus:  DM type: 2 Dx'ed: 3785 Complications: PN.  Therapy: 2 oral meds.   GDM: never DKA: never Severe hypoglycemia: never.  Pancreatitis: never.   Pancreatic imaging: normal on 2012 CT.   SDOH: she refuses insulin.   Other: none Interval history: no cbg record, but states cbg's vary from 155-256.  pt reports she takes meds as rx'ed. She has again lost her insurance.  She says Rybelsus is causing skin tingling and back pain.   Past Medical History:  Diagnosis Date  . Diabetes mellitus   . GERD (gastroesophageal reflux disease)   . HTN (hypertension)   . Hypercholesteremia   . Hypercholesterolemia   . Migraines   . Type II diabetes mellitus ([place])     Past Surgical History:  Procedure Laterality Date  . ABDOMINAL HYSTERECTOMY  2008   Leiomyoma/menorrhagia  . COLONOSCOPY  2012  . COLONOSCOPY N/A 04/21/2017   Procedure: COLONOSCOPY;  Surgeon: Daneil Dolin, MD;  Location: [facility];  Service: Endoscopy;  Laterality: N/A;  2:15pm  . LASER ABLATION OF THE CERVIX     dysplasia    Social History   Socioeconomic History  . Marital status: Single    Spouse name: Not on file  . Number of children: Not on file  . Years of education: Not on file  . Highest education level: Not on file  Occupational History  . Not on file  Tobacco Use  . Smoking status: Never Smoker  . Smokeless tobacco: Never Used  Vaping Use  . Vaping Use: Never used  Substance and Sexual Activity  . Alcohol use: No    Alcohol/week: 0.0 standard drinks  . Drug use: No  . Sexual activity: Yes    Comment: HYSTERECTOMY-1st intercourse 17 yo-5 partners  Other Topics Concern  . 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   Social Determinants of Health   Financial Resource Strain: Not on file   Food Insecurity: Not on file  Transportation Needs: Not on file  Physical Activity: Not on file  Stress: Not on file  Social Connections: Not on file  Intimate Partner Violence: Not on file    Current Outpatient Medications on File Prior to Visit  Medication Sig Dispense Refill  . acetaminophen (TYLENOL) 500 MG tablet Take 500 mg by mouth every 6 (six) hours as needed (for headache.).     Marland Kitchen aspirin EC 81 MG tablet Take 81 mg by mouth daily.    . fluticasone (FLONASE) 50 MCG/ACT nasal spray Place 2 sprays into both nostrils daily. (Patient taking differently: Place 2 sprays into both nostrils daily as needed (for summer/hay fever allergies). ) 16 g 6  . glimepiride (AMARYL) 2 MG tablet Take 2 tablets (4 mg total) by mouth daily with breakfast. 180 tablet 2  . glucose blood (CONTOUR NEXT TEST) test strip 1 each by Other route daily. And lancets 1/day 100 each 3  . lisinopril-hydrochlorothiazide (ZESTORETIC) 20-12.5 MG tablet Take 1 tablet by mouth daily. 90 tablet 1  . metFORMIN (GLUCOPHAGE-XR) 500 MG 24 hr tablet TAKE 2 TABLETS BY MOUTH IN THE MORNING 180 tablet 0  . Polyethyl Glycol-Propyl Glycol (SYSTANE) 0.4-0.3 % SOLN Place 1-2 drops into both eyes 3 (three) times daily as needed (for dry eyes.).    Marland Kitchen  simvastatin (ZOCOR) 40 MG tablet Take 1 tablet (40 mg total) by mouth at bedtime. 90 tablet 1  . Vitamin D, Ergocalciferol, (DRISDOL) 1.25 MG (50000 UNIT) CAPS capsule Take 1 capsule (50,000 Units total) by mouth every 7 (seven) days. 12 capsule 0   No current facility-administered medications on file prior to visit.    No Known Allergies  Family History  Problem Relation Age of Onset  . Colon polyps Maternal Aunt   . Colon polyps Paternal Uncle   . Colon cancer Maternal Aunt        in 43s when diagnosed.   Marland Kitchen Heart disease Father   . Diabetes Father   . Diabetes Mother   . Diabetes Sister   . Diabetes Brother   . Heart disease Brother   . Cancer Maternal Aunt         Pancreatic    BP 118/80   Pulse 86   Ht 5\' 5"  (1.651 m)   Wt 156 lb 3.2 oz (70.9 kg)   SpO2 96%   BMI 25.99 kg/m   Review of Systems She denies hypoglycemia.      Objective:   Physical Exam VITAL SIGNS:  See vs page GENERAL: no distress Pulses: dorsalis pedis intact bilat.   MSK: no deformity of the feet CV: no leg edema Skin:  no ulcer on the feet.  normal color and temp on the feet. Neuro: sensation is intact to touch on the feet  Lab Results  Component Value Date   CREATININE 0.76 10/24/2019   BUN 10 10/24/2019   NA 138 10/24/2019   K 4.3 10/24/2019   CL 100 10/24/2019   CO2 29 10/24/2019   Lab Results  Component Value Date   HGBA1C 8.5 (A) 04/01/2020       Assessment & Plan:  Type 2 DM, with PN: uncontrolled Skin sxs, due to Rybelsus.  Patient Instructions  Please stop taking the rybelsus, and: I have sent a prescription to your pharmacy, to add "pioglitazone." Please continue the same other diabetes medications.   check your blood sugar once a day.  vary the time of day when you check, between before the 3 meals, and at bedtime.  also check if you have symptoms of your blood sugar being too high or too low.  please keep a record of the readings and bring it to your next appointment here (or you can bring the meter itself).  You can write it on any piece of paper.  please call us sooner if your blood sugar goes below 70, or if you have a lot of readings over 200.   Please come back for a follow-up appointment in 2 months.

## 2020-04-01 NOTE — Patient Instructions (Addendum)
Please stop taking the rybelsus, and: I have sent a prescription to your pharmacy, to add "pioglitazone." Please continue the same other diabetes medications.   check your blood sugar once a day.  vary the time of day when you check, between before the 3 meals, and at bedtime.  also check if you have symptoms of your blood sugar being too high or too low.  please keep a record of the readings and bring it to your next appointment here (or you can bring the meter itself).  You can write it on any piece of paper.  please call us sooner if your blood sugar goes below 70, or if you have a lot of readings over 200.   Please come back for a follow-up appointment in 2 months.

## 2020-04-14 ENCOUNTER — Encounter: Payer: Self-pay | Admitting: Family Medicine

## 2020-04-14 ENCOUNTER — Other Ambulatory Visit: Payer: Self-pay

## 2020-04-14 ENCOUNTER — Ambulatory Visit (INDEPENDENT_AMBULATORY_CARE_PROVIDER_SITE_OTHER): Payer: Self-pay | Admitting: Family Medicine

## 2020-04-14 VITALS — BP 111/60 | HR 80 | Temp 97.5°F | Resp 15 | Ht 65.0 in | Wt 155.6 lb

## 2020-04-14 DIAGNOSIS — M792 Neuralgia and neuritis, unspecified: Secondary | ICD-10-CM

## 2020-04-14 MED ORDER — GABAPENTIN 100 MG PO CAPS: 100.0000 mg | ORAL_CAPSULE | Freq: Three times a day (TID) | ORAL | 3 refills | Status: DC

## 2020-04-14 NOTE — Progress Notes (Signed)
   Subjective:    Patient ID: Dana Payne, female    DOB: February 08, 1966, 54 y.o.   MRN: [id number]  HPI Burning pain- under L breast.  Sxs started ~6 weeks ago, intermittent pain.  Pt reports sxs have improved since Saturday.  No rash.  Pt has 'small bumped that popped out right here' just below sternum.  Pt reports pain is 'like a zinger.  It will stop me in my tracks'.   Review of Systems For ROS see HPI   This visit occurred during the SARS-CoV-2 public health emergency.  Safety protocols were in place, including screening questions prior to the visit, additional usage of staff PPE, and extensive cleaning of exam room while observing appropriate contact time as indicated for disinfecting solutions.       Objective:   Physical Exam Vitals reviewed.  Constitutional:      Appearance: Normal appearance.  HENT:     Head: Normocephalic and atraumatic.  Skin:    General: Skin is warm and dry.     Findings: No erythema or rash.  Neurological:     General: No focal deficit present.     Mental Status: She is alert.     Sensory: No sensory deficit.  Psychiatric:        Mood and Affect: Mood normal.        Behavior: Behavior normal.        Thought Content: Thought content normal.           Assessment & Plan:  Neuropathic pain- new.  No obvious herpetic lesions.  Pt does have hx of uncontrolled DM w/ peripheral neuropathy and this could be related.  Start Gabapentin and see if sxs improve.  Pt expressed understanding and is in agreement w/ plan.

## 2020-04-14 NOTE — Patient Instructions (Signed)
Follow up as needed or as scheduled This seems to be nerve pain START the Gabapentin 3x/day for pain Call with any questions or concerns Stay Safe!  Stay Healthy! Happy Holidays!!

## 2020-07-01 ENCOUNTER — Other Ambulatory Visit: Payer: Self-pay

## 2020-07-01 ENCOUNTER — Ambulatory Visit (INDEPENDENT_AMBULATORY_CARE_PROVIDER_SITE_OTHER): Payer: Self-pay | Admitting: Endocrinology

## 2020-07-01 VITALS — BP 108/60 | HR 74 | Ht 65.0 in | Wt 163.0 lb

## 2020-07-01 DIAGNOSIS — E1142 Type 2 diabetes mellitus with diabetic polyneuropathy: Secondary | ICD-10-CM

## 2020-07-01 LAB — POCT GLYCOSYLATED HEMOGLOBIN (HGB A1C): Hemoglobin A1C: 7.9 % — AB (ref 4.0–5.6)

## 2020-07-01 MED ORDER — PIOGLITAZONE HCL 45 MG PO TABS: 45.0000 mg | ORAL_TABLET | Freq: Every day | ORAL | 3 refills | Status: DC

## 2020-07-01 NOTE — Patient Instructions (Addendum)
Please continue the same 3 diabetes medications.   check your blood sugar once a day.  vary the time of day when you check, between before the 3 meals, and at bedtime.  also check if you have symptoms of your blood sugar being too high or too low.  please keep a record of the readings and bring it to your next appointment here (or you can bring the meter itself).  You can write it on any piece of paper.  please call us sooner if your blood sugar goes below 70, or if you have a lot of readings over 200.   Please come back for a follow-up appointment in 4 months.

## 2020-07-01 NOTE — Progress Notes (Signed)
Subjective:    Patient ID: Dana Payne, female    DOB: 03-31-1966, 55 y.o.   MRN: [id number]  HPI Pt returns for f/u of diabetes mellitus:  DM type: 2 Dx'ed: 8938 Complications: PN.  Therapy: 3 oral meds.   GDM: never DKA: never Severe hypoglycemia: never.  Pancreatitis: never.   Pancreatic imaging: normal on 2012 CT.   SDOH: she refuses insulin; she has no health insurance.   Other: She did not tolerate Rybelsus (skin tingling and back pain).  Interval history: no cbg record, but states cbg's vary from 102-225.  pt reports she takes meds as rx'ed.   Past Medical History:  Diagnosis Date  . Diabetes mellitus   . GERD (gastroesophageal reflux disease)   . HTN (hypertension)   . Hypercholesteremia   . Hypercholesterolemia   . Migraines   . Type II diabetes mellitus ([place])     Past Surgical History:  Procedure Laterality Date  . ABDOMINAL HYSTERECTOMY  2008   Leiomyoma/menorrhagia  . COLONOSCOPY  2012  . COLONOSCOPY N/A 04/21/2017   Procedure: COLONOSCOPY;  Surgeon: Daneil Dolin, MD;  Location: [facility];  Service: Endoscopy;  Laterality: N/A;  2:15pm  . LASER ABLATION OF THE CERVIX     dysplasia    Social History   Socioeconomic History  . Marital status: Single    Spouse name: Not on file  . Number of children: Not on file  . Years of education: Not on file  . Highest education level: Not on file  Occupational History  . Not on file  Tobacco Use  . Smoking status: Never Smoker  . Smokeless tobacco: Never Used  Vaping Use  . Vaping Use: Never used  Substance and Sexual Activity  . Alcohol use: No    Alcohol/week: 0.0 standard drinks  . Drug use: No  . Sexual activity: Yes    Comment: HYSTERECTOMY-1st intercourse 17 yo-5 partners  Other Topics Concern  . 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   Social Determinants of Health   Financial Resource Strain: Not on file  Food  Insecurity: Not on file  Transportation Needs: Not on file  Physical Activity: Not on file  Stress: Not on file  Social Connections: Not on file  Intimate Partner Violence: Not on file    Current Outpatient Medications on File Prior to Visit  Medication Sig Dispense Refill  . acetaminophen (TYLENOL) 500 MG tablet Take 500 mg by mouth every 6 (six) hours as needed (for headache.).     Marland Kitchen aspirin EC 81 MG tablet Take 81 mg by mouth daily.    . fluticasone (FLONASE) 50 MCG/ACT nasal spray Place 2 sprays into both nostrils daily. (Patient taking differently: Place 2 sprays into both nostrils daily as needed (for summer/hay fever allergies).) 16 g 6  . gabapentin (NEURONTIN) 100 MG capsule Take 1 capsule (100 mg total) by mouth 3 (three) times daily. 90 capsule 3  . glimepiride (AMARYL) 2 MG tablet Take 2 tablets (4 mg total) by mouth daily with breakfast. 180 tablet 2  . glucose blood (CONTOUR NEXT TEST) test strip 1 each by Other route daily. And lancets 1/day 100 each 3  . lisinopril-hydrochlorothiazide (ZESTORETIC) 20-12.5 MG tablet Take 1 tablet by mouth daily. 90 tablet 1  . metFORMIN (GLUCOPHAGE-XR) 500 MG 24 hr tablet TAKE 2 TABLETS BY MOUTH IN THE MORNING 180 tablet 0  . Polyethyl Glycol-Propyl Glycol 0.4-0.3 %  SOLN Place 1-2 drops into both eyes 3 (three) times daily as needed (for dry eyes.).    Marland Kitchen simvastatin (ZOCOR) 40 MG tablet Take 1 tablet (40 mg total) by mouth at bedtime. 90 tablet 1  . Vitamin D, Ergocalciferol, (DRISDOL) 1.25 MG (50000 UNIT) CAPS capsule Take 1 capsule (50,000 Units total) by mouth every 7 (seven) days. 12 capsule 0   No current facility-administered medications on file prior to visit.    No Known Allergies  Family History  Problem Relation Age of Onset  . Colon polyps Maternal Aunt   . Colon polyps Paternal Uncle   . Colon cancer Maternal Aunt        in 23s when diagnosed.   Marland Kitchen Heart disease Father   . Diabetes Father   . Diabetes Mother   . Diabetes  Sister   . Diabetes Brother   . Heart disease Brother   . Cancer Maternal Aunt        Pancreatic    BP 108/60 (BP Location: Right Arm, Patient Position: Sitting, Cuff Size: Large)   Pulse 74   Ht 5\' 5"  (1.651 m)   Wt 163 lb (73.9 kg)   SpO2 98%   BMI 27.12 kg/m    Review of Systems She denies hypoglycemia.      Objective:   Physical Exam VITAL SIGNS:  See vs page GENERAL: no distress Pulses: dorsalis pedis intact bilat.   MSK: no deformity of the feet CV: no leg edema Skin:  no ulcer on the feet.  normal color and temp on the feet. Neuro: sensation is intact to touch on the feet.    Lab Results  Component Value Date   HGBA1C 7.9 (A) 07/01/2020       Assessment & Plan:  Type 2 DM, with PN: uncontrolled.  pioglitazone will work more with time.    Patient Instructions  Please continue the same 3 diabetes medications.   check your blood sugar once a day.  vary the time of day when you check, between before the 3 meals, and at bedtime.  also check if you have symptoms of your blood sugar being too high or too low.  please keep a record of the readings and bring it to your next appointment here (or you can bring the meter itself).  You can write it on any piece of paper.  please call us sooner if your blood sugar goes below 70, or if you have a lot of readings over 200.   Please come back for a follow-up appointment in 4 months.

## 2020-07-15 ENCOUNTER — Other Ambulatory Visit: Payer: Self-pay | Admitting: Family Medicine

## 2020-07-15 ENCOUNTER — Other Ambulatory Visit: Payer: Self-pay | Admitting: Endocrinology

## 2020-07-15 DIAGNOSIS — I10 Essential (primary) hypertension: Secondary | ICD-10-CM

## 2020-07-15 NOTE — Telephone Encounter (Signed)
Please schedule an appointment with Dr.Tabori for further refills.

## 2020-08-07 ENCOUNTER — Other Ambulatory Visit: Payer: Self-pay | Admitting: Family Medicine

## 2020-08-12 ENCOUNTER — Other Ambulatory Visit: Payer: Self-pay | Admitting: Family Medicine

## 2020-09-09 ENCOUNTER — Encounter: Payer: Self-pay | Admitting: Family Medicine

## 2020-09-09 ENCOUNTER — Ambulatory Visit (INDEPENDENT_AMBULATORY_CARE_PROVIDER_SITE_OTHER): Payer: Self-pay | Admitting: Family Medicine

## 2020-09-09 ENCOUNTER — Other Ambulatory Visit: Payer: Self-pay

## 2020-09-09 VITALS — BP 115/70 | HR 85 | Temp 97.9°F | Resp 17 | Ht 65.0 in | Wt 168.2 lb

## 2020-09-09 DIAGNOSIS — E663 Overweight: Secondary | ICD-10-CM

## 2020-09-09 DIAGNOSIS — I10 Essential (primary) hypertension: Secondary | ICD-10-CM

## 2020-09-09 DIAGNOSIS — E785 Hyperlipidemia, unspecified: Secondary | ICD-10-CM

## 2020-09-09 LAB — LIPID PANEL
Cholesterol: 144 mg/dL (ref 0–200)
HDL: 48.2 mg/dL (ref >39.00)
LDL Cholesterol: 75 mg/dL (ref 0–99)
NonHDL: 95.76
Total CHOL/HDL Ratio: 3
Triglycerides: 104 mg/dL (ref 0.0–149.0)
VLDL: 20.8 mg/dL (ref 0.0–40.0)

## 2020-09-09 LAB — HEPATIC FUNCTION PANEL
ALT: 19 U/L (ref 0–35)
AST: 15 U/L (ref 0–37)
Albumin: 4.7 g/dL (ref 3.5–5.2)
Alkaline Phosphatase: 78 U/L (ref 39–117)
Bilirubin, Direct: 0.1 mg/dL (ref 0.0–0.3)
Total Bilirubin: 0.5 mg/dL (ref 0.2–1.2)
Total Protein: 6.9 g/dL (ref 6.0–8.3)

## 2020-09-09 LAB — BASIC METABOLIC PANEL
BUN: 10 mg/dL (ref 6–23)
CO2: 30 mEq/L (ref 19–32)
Calcium: 9.8 mg/dL (ref 8.4–10.5)
Chloride: 100 mEq/L (ref 96–112)
Creatinine, Ser: 0.79 mg/dL (ref 0.40–1.20)
GFR: 83.98 mL/min (ref >60.00)
Glucose, Bld: 220 mg/dL — ABNORMAL HIGH (ref 70–99)
Potassium: 3.9 mEq/L (ref 3.5–5.1)
Sodium: 140 mEq/L (ref 135–145)

## 2020-09-09 LAB — CBC WITH DIFFERENTIAL/PLATELET
Basophils Absolute: 0 10*3/uL (ref 0.0–0.1)
Basophils Relative: 0.6 % (ref 0.0–3.0)
Eosinophils Absolute: 0.1 10*3/uL (ref 0.0–0.7)
Eosinophils Relative: 1.3 % (ref 0.0–5.0)
HCT: 40.7 % (ref 36.0–46.0)
Hemoglobin: 13.8 g/dL (ref 12.0–15.0)
Lymphocytes Relative: 23 % (ref 12.0–46.0)
Lymphs Abs: 1 10*3/uL (ref 0.7–4.0)
MCHC: 33.8 g/dL (ref 30.0–36.0)
MCV: 88.3 fl (ref 78.0–100.0)
Monocytes Absolute: 0.4 10*3/uL (ref 0.1–1.0)
Monocytes Relative: 8.1 % (ref 3.0–12.0)
Neutro Abs: 3 10*3/uL (ref 1.4–7.7)
Neutrophils Relative %: 67 % (ref 43.0–77.0)
Platelets: 222 10*3/uL (ref 150.0–400.0)
RBC: 4.61 Mil/uL (ref 3.87–5.11)
RDW: 13.3 % (ref 11.5–15.5)
WBC: 4.4 10*3/uL (ref 4.0–10.5)

## 2020-09-09 LAB — TSH: TSH: 1.33 u[IU]/mL (ref 0.35–4.50)

## 2020-09-09 MED ORDER — CETIRIZINE HCL 10 MG PO TABS: 10.0000 mg | ORAL_TABLET | Freq: Every day | ORAL | 3 refills | Status: DC

## 2020-09-09 MED ORDER — LISINOPRIL-HYDROCHLOROTHIAZIDE 20-12.5 MG PO TABS: 1.0000 | ORAL_TABLET | Freq: Every day | ORAL | 1 refills | Status: DC

## 2020-09-09 MED ORDER — SIMVASTATIN 40 MG PO TABS: 40.0000 mg | ORAL_TABLET | Freq: Every day | ORAL | 1 refills | Status: DC

## 2020-09-09 NOTE — Assessment & Plan Note (Signed)
Chronic problem.  Good control on Lisinopril HCTZ.  Currently asymptomatic w/ exception of blurry vision w/ elevated sugars.  Check labs.  No anticipated med changes.

## 2020-09-09 NOTE — Progress Notes (Signed)
   Subjective:    Patient ID: Dana Payne, female    DOB: 03-29-1966, 55 y.o.   MRN: [id number]  HPI HTN- chronic problem, on Lisinopril HCTZ 20/12.5mg  daily w/ good control.  Pt reports feeling good.  No CP, SOB, HAs, edema.  + blurry vision 'when I have 1 too many cookies'- scheduling appt  Hyperlipidemia- chronic problem, on Simvastatin 40mg  daily.  No abd pain, N/V.  Overweight- pt has gained 13 lbs since November.  Pt is walking 1 hr/day.   Review of Systems For ROS see HPI   This visit occurred during the SARS-CoV-2 public health emergency.  Safety protocols were in place, including screening questions prior to the visit, additional usage of staff PPE, and extensive cleaning of exam room while observing appropriate contact time as indicated for disinfecting solutions.       Objective:   Physical Exam Vitals reviewed.  Constitutional:      General: She is not in acute distress.    Appearance: Normal appearance. She is well-developed. She is not ill-appearing.  HENT:     Head: Normocephalic and atraumatic.  Eyes:     Conjunctiva/sclera: Conjunctivae normal.     Pupils: Pupils are equal, round, and reactive to light.  Neck:     Thyroid: No thyromegaly.  Cardiovascular:     Rate and Rhythm: Normal rate and regular rhythm.     Pulses: Normal pulses.     Heart sounds: Normal heart sounds. No murmur heard.   Pulmonary:     Effort: Pulmonary effort is normal. No respiratory distress.     Breath sounds: Normal breath sounds.  Abdominal:     General: There is no distension.     Palpations: Abdomen is soft.     Tenderness: There is no abdominal tenderness.  Musculoskeletal:     Cervical back: Normal range of motion and neck supple.     Right lower leg: No edema.     Left lower leg: No edema.  Lymphadenopathy:     Cervical: No cervical adenopathy.  Skin:    General: Skin is warm and dry.  Neurological:     General: No focal deficit present.     Mental Status: She is  alert and oriented to person, place, and time.  Psychiatric:        Mood and Affect: Mood normal.        Behavior: Behavior normal.        Thought Content: Thought content normal.           Assessment & Plan:

## 2020-09-09 NOTE — Assessment & Plan Note (Signed)
Chronic problem.  On Simvastatin 40mg daily w/o difficulty.  Check labs.  Adjust meds prn  

## 2020-09-09 NOTE — Patient Instructions (Signed)
Schedule your complete physical in 6 months We'll notify you of your lab results and make any changes if needed Continue to work on healthy diet and regular exercise- you're doing great!!! Call with any questions or concerns Have a great summer!!! 

## 2020-09-09 NOTE — Assessment & Plan Note (Signed)
Deteriorated.  Pt has gained 13 lbs since November but she is walking regularly for an hour a day.  Applauded her efforts.  Will follow.

## 2020-11-17 ENCOUNTER — Ambulatory Visit (INDEPENDENT_AMBULATORY_CARE_PROVIDER_SITE_OTHER): Payer: Self-pay | Admitting: Endocrinology

## 2020-11-17 ENCOUNTER — Other Ambulatory Visit: Payer: Self-pay

## 2020-11-17 VITALS — BP 120/64 | HR 94 | Ht 65.0 in | Wt 171.6 lb

## 2020-11-17 DIAGNOSIS — E1142 Type 2 diabetes mellitus with diabetic polyneuropathy: Secondary | ICD-10-CM

## 2020-11-17 LAB — POCT GLYCOSYLATED HEMOGLOBIN (HGB A1C): Hemoglobin A1C: 7.7 % — AB (ref 4.0–5.6)

## 2020-11-17 MED ORDER — PIOGLITAZONE HCL 45 MG PO TABS: 45.0000 mg | ORAL_TABLET | Freq: Every day | ORAL | 3 refills | Status: DC

## 2020-11-17 MED ORDER — METFORMIN HCL ER 500 MG PO TB24: ORAL_TABLET | ORAL | 3 refills | Status: DC

## 2020-11-17 MED ORDER — GLIMEPIRIDE 2 MG PO TABS: 4.0000 mg | ORAL_TABLET | Freq: Every day | ORAL | 2 refills | Status: DC

## 2020-11-17 NOTE — Patient Instructions (Addendum)
Please continue the same 3 diabetes medications.   check your blood sugar once a day.  vary the time of day when you check, between before the 3 meals, and at bedtime.  also check if you have symptoms of your blood sugar being too high or too low.  please keep a record of the readings and bring it to your next appointment here (or you can bring the meter itself).  You can write it on any piece of paper.  please call us sooner if your blood sugar goes below 70, or if you have a lot of readings over 200.   Please come back for a follow-up appointment in 4 months.

## 2020-11-17 NOTE — Progress Notes (Signed)
   Subjective:    Patient ID: Dana Payne, female    DOB: 11/20/1965, 55 y.o.   MRN: [id number]  HPI Pt returns for f/u of diabetes mellitus:  DM type: 2 Dx'ed: 123456 Complications: PN.  Therapy: 3 oral meds.   GDM: never DKA: never Severe hypoglycemia: never.  Pancreatitis: never.   Pancreatic imaging: normal on 2012 CT.   SDOH: she refuses insulin; she has no health insurance.   Other: She did not tolerate Rybelsus (skin tingling and back pain).  Interval history: no cbg record, but states cbg's vary from 82-252.  pt reports she takes meds as rx'ed.     Review of Systems She denies hypoglycemia.      Objective:   Physical Exam Pulses: dorsalis pedis intact bilat.   MSK: no deformity of the feet CV: no leg edema.   Skin:  no ulcer on the feet.  normal color and temp on the feet.   Neuro: sensation is intact to touch on the feet.    Lab Results  Component Value Date   HGBA1C 7.7 (A) 11/17/2020   Lab Results  Component Value Date   CREATININE 0.79 09/09/2020   BUN 10 09/09/2020   NA 140 09/09/2020   K 3.9 09/09/2020   CL 100 09/09/2020   CO2 30 09/09/2020      Assessment & Plan:  Type 2 DM: Uncontrolled.  I advised pt to increase metformin, but she declines.    Patient Instructions  Please continue the same 3 diabetes medications.   check your blood sugar once a day.  vary the time of day when you check, between before the 3 meals, and at bedtime.  also check if you have symptoms of your blood sugar being too high or too low.  please keep a record of the readings and bring it to your next appointment here (or you can bring the meter itself).  You can write it on any piece of paper.  please call us sooner if your blood sugar goes below 70, or if you have a lot of readings over 200.   Please come back for a follow-up appointment in 4 months.

## 2021-02-15 ENCOUNTER — Other Ambulatory Visit: Payer: Self-pay | Admitting: Family Medicine

## 2021-02-15 DIAGNOSIS — I10 Essential (primary) hypertension: Secondary | ICD-10-CM

## 2021-03-13 ENCOUNTER — Ambulatory Visit: Payer: Self-pay | Admitting: Endocrinology

## 2021-04-07 ENCOUNTER — Ambulatory Visit: Payer: Self-pay | Admitting: Endocrinology

## 2021-04-26 DIAGNOSIS — N904 Leukoplakia of vulva: Secondary | ICD-10-CM

## 2021-04-26 HISTORY — DX: Leukoplakia of vulva: N90.4

## 2021-05-18 ENCOUNTER — Telehealth: Payer: Self-pay | Admitting: Family Medicine

## 2021-05-18 NOTE — Telephone Encounter (Signed)
Chief Complaint Runny or Stuffy Nose Reason for Call Request to Schedule Office Appointment Initial Comment Caller asks to sched routine f/u appt but also sinus issues - drainage/stuffy sinuses. Translation No Disp. Time Eilene Ghazi Time) Disposition Final User 05/18/2021 8:20:08 AM Attempt made - message left Dew, RN, Levada Dy 05/18/2021 8:58:37 AM Clinical Call Yes Dew, RN, Levada Dy Comments User: Raford Pitcher, RN Date/Time Eilene Ghazi Time): 05/18/2021 8:57:23 AM Caller stated that the issue has been resolved when RN called back and declined triage. She already has an app't

## 2021-05-20 ENCOUNTER — Encounter: Payer: Self-pay | Admitting: Family Medicine

## 2021-05-20 ENCOUNTER — Ambulatory Visit (INDEPENDENT_AMBULATORY_CARE_PROVIDER_SITE_OTHER): Payer: BC Managed Care – PPO | Admitting: Family Medicine

## 2021-05-20 VITALS — BP 120/62 | HR 98 | Temp 99.0°F | Resp 18 | Ht 65.0 in | Wt 172.8 lb

## 2021-05-20 DIAGNOSIS — R5383 Other fatigue: Secondary | ICD-10-CM

## 2021-05-20 DIAGNOSIS — R52 Pain, unspecified: Secondary | ICD-10-CM | POA: Diagnosis not present

## 2021-05-20 DIAGNOSIS — R051 Acute cough: Secondary | ICD-10-CM | POA: Diagnosis not present

## 2021-05-20 LAB — POCT INFLUENZA A/B
Influenza A, POC: NEGATIVE
Influenza B, POC: NEGATIVE

## 2021-05-20 MED ORDER — GUAIFENESIN-CODEINE 100-10 MG/5ML PO SYRP: 10.0000 mL | ORAL_SOLUTION | Freq: Three times a day (TID) | ORAL | 0 refills | Status: DC | PRN

## 2021-05-20 NOTE — Progress Notes (Signed)
° °  Subjective:    Patient ID: Dana Payne, female    DOB: 1965/05/13, 56 y.o.   MRN: [id number]  HPI Exhaustion- pt was working 50-60 hrs/week over the holidays. Pt has had 2 episodes where she sat straight up overnight, sweaty, grabbing her chest.  Sxs resolved spontaneously.  She attributes this to low blood sugar.  Other than those 2 episodes, no other CP.  Denies SOB.  + chills and body aches.  Sxs started ~2 weeks ago.  Pt has done multiple COVID tests that were negative.  + sick contacts.  Last week had HA, nausea, vomiting x1.  No diarrhea.  + cough- dry, hacking.   Review of Systems For ROS see HPI   This visit occurred during the SARS-CoV-2 public health emergency.  Safety protocols were in place, including screening questions prior to the visit, additional usage of staff PPE, and extensive cleaning of exam room while observing appropriate contact time as indicated for disinfecting solutions.      Objective:   Physical Exam Vitals reviewed.  Constitutional:      General: She is not in acute distress.    Appearance: Normal appearance. She is not ill-appearing.     Comments: Sleeping when I entered the room  HENT:     Head: Normocephalic and atraumatic.     Right Ear: Tympanic membrane normal.     Left Ear: Tympanic membrane normal.     Nose:     Comments: No TTP over frontal or maxillary sinuses Eyes:     Extraocular Movements: Extraocular movements intact.     Conjunctiva/sclera: Conjunctivae normal.     Pupils: Pupils are equal, round, and reactive to light.  Cardiovascular:     Rate and Rhythm: Normal rate and regular rhythm.     Pulses: Normal pulses.     Heart sounds: Normal heart sounds.  Pulmonary:     Effort: Pulmonary effort is normal. No respiratory distress.     Breath sounds: Normal breath sounds. No wheezing or rhonchi.     Comments: + hacking cough Musculoskeletal:     Cervical back: Neck supple.  Lymphadenopathy:     Cervical: No cervical adenopathy.   Skin:    General: Skin is warm and dry.  Neurological:     General: No focal deficit present.     Mental Status: She is oriented to person, place, and time.  Psychiatric:        Mood and Affect: Mood normal.        Behavior: Behavior normal.          Assessment & Plan:   Cough/Body Aches/Exhaustion- new.  Sxs started ~2 weeks ago.  Multiple COVID tests have been negative.  Flu test in office today negative.  Suspect this is a combination of a viral illness and just being overall rundown with how much she has been working.  Encouraged rest, fluids, cough medicine PRN.  Will check labs to r/o metabolic causes of fatigue.  Pt to let me know if sxs fail to improve or worsen.  Pt expressed understanding and is in agreement w/ plan.

## 2021-05-20 NOTE — Patient Instructions (Addendum)
Follow up as needed or in May to recheck BP and cholesterol We'll notify you of your lab results and make any changes if needed Thankfully your flu test is negative I think this is a combination of a viral illness and being run down/exhausted Use the codeine based cough syrup to help w/ both cough and sleep Delsym or Robitussin cough syrup during the day Make sure you are drinking plenty of water and getting LOTS of rest Call with any questions or concerns Hang in there!!

## 2021-05-21 LAB — BASIC METABOLIC PANEL
BUN: 8 mg/dL (ref 6–23)
CO2: 31 mEq/L (ref 19–32)
Calcium: 9.9 mg/dL (ref 8.4–10.5)
Chloride: 99 mEq/L (ref 96–112)
Creatinine, Ser: 0.76 mg/dL (ref 0.40–1.20)
GFR: 87.54 mL/min (ref >60.00)
Glucose, Bld: 173 mg/dL — ABNORMAL HIGH (ref 70–99)
Potassium: 3.4 mEq/L — ABNORMAL LOW (ref 3.5–5.1)
Sodium: 138 mEq/L (ref 135–145)

## 2021-05-21 LAB — CBC WITH DIFFERENTIAL/PLATELET
Basophils Absolute: 0 10*3/uL (ref 0.0–0.1)
Basophils Relative: 0.5 % (ref 0.0–3.0)
Eosinophils Absolute: 0.3 10*3/uL (ref 0.0–0.7)
Eosinophils Relative: 4.6 % (ref 0.0–5.0)
HCT: 41 % (ref 36.0–46.0)
Hemoglobin: 13.5 g/dL (ref 12.0–15.0)
Lymphocytes Relative: 19.7 % (ref 12.0–46.0)
Lymphs Abs: 1.1 10*3/uL (ref 0.7–4.0)
MCHC: 33 g/dL (ref 30.0–36.0)
MCV: 89.4 fl (ref 78.0–100.0)
Monocytes Absolute: 0.5 10*3/uL (ref 0.1–1.0)
Monocytes Relative: 8.4 % (ref 3.0–12.0)
Neutro Abs: 3.7 10*3/uL (ref 1.4–7.7)
Neutrophils Relative %: 66.8 % (ref 43.0–77.0)
Platelets: 208 10*3/uL (ref 150.0–400.0)
RBC: 4.59 Mil/uL (ref 3.87–5.11)
RDW: 12.9 % (ref 11.5–15.5)
WBC: 5.6 10*3/uL (ref 4.0–10.5)

## 2021-05-21 LAB — HEPATIC FUNCTION PANEL
ALT: 16 U/L (ref 0–35)
AST: 16 U/L (ref 0–37)
Albumin: 4.7 g/dL (ref 3.5–5.2)
Alkaline Phosphatase: 82 U/L (ref 39–117)
Bilirubin, Direct: 0.1 mg/dL (ref 0.0–0.3)
Total Bilirubin: 0.5 mg/dL (ref 0.2–1.2)
Total Protein: 7.3 g/dL (ref 6.0–8.3)

## 2021-05-21 LAB — TSH: TSH: 1.34 u[IU]/mL (ref 0.35–5.50)

## 2021-05-22 ENCOUNTER — Telehealth: Payer: Self-pay

## 2021-05-22 NOTE — Telephone Encounter (Signed)
-----   Message from [name], MD sent at 05/22/2021  7:32 AM EST ----- Labs look good w/ exception of mildly low potassium and elevated sugar.  You can increase your potassium w/ leafy greens, bananas, citrus fruits, etc.  As for the sugar, continue to take your medication and work on low carb diet and regular exercise

## 2021-05-25 ENCOUNTER — Other Ambulatory Visit: Payer: Self-pay | Admitting: Family Medicine

## 2021-05-25 NOTE — Telephone Encounter (Signed)
LM to return call for results

## 2021-05-26 ENCOUNTER — Telehealth: Payer: Self-pay

## 2021-05-26 NOTE — Telephone Encounter (Signed)
Pt is returning phone call regarding labs ok to leave detailed message on [phone]

## 2021-05-26 NOTE — Telephone Encounter (Signed)
Called pt back and informed

## 2021-05-29 ENCOUNTER — Other Ambulatory Visit: Payer: Self-pay | Admitting: Family Medicine

## 2021-05-29 DIAGNOSIS — I10 Essential (primary) hypertension: Secondary | ICD-10-CM

## 2021-05-29 LAB — HM MAMMOGRAPHY

## 2021-06-03 ENCOUNTER — Encounter: Payer: Self-pay | Admitting: Family Medicine

## 2021-06-05 ENCOUNTER — Other Ambulatory Visit: Payer: Self-pay

## 2021-06-05 ENCOUNTER — Ambulatory Visit (INDEPENDENT_AMBULATORY_CARE_PROVIDER_SITE_OTHER): Payer: BC Managed Care – PPO | Admitting: Endocrinology

## 2021-06-05 VITALS — BP 110/60 | HR 83 | Ht 65.0 in | Wt 174.0 lb

## 2021-06-05 DIAGNOSIS — E1142 Type 2 diabetes mellitus with diabetic polyneuropathy: Secondary | ICD-10-CM

## 2021-06-05 LAB — POCT GLYCOSYLATED HEMOGLOBIN (HGB A1C): Hemoglobin A1C: 7 % — AB (ref 4.0–5.6)

## 2021-06-05 NOTE — Patient Instructions (Addendum)
Please continue the same 3 diabetes medications.   check your blood sugar once a day.  vary the time of day when you check, between before the 3 meals, and at bedtime.  also check if you have symptoms of your blood sugar being too high or too low.  please keep a record of the readings and bring it to your next appointment here (or you can bring the meter itself).  You can write it on any piece of paper.  please call us sooner if your blood sugar goes below 70, or if you have a lot of readings over 200.   Please come back for a follow-up appointment in 6 months.   

## 2021-06-05 NOTE — Progress Notes (Signed)
Subjective:    Patient ID: Dana Payne, female    DOB: 1965-07-13, 56 y.o.   MRN: [id number]  HPI Pt returns for f/u of diabetes mellitus:  DM type: 2 Dx'ed: 3419 Complications: PN.  Therapy: 3 oral meds.   GDM: never DKA: never Severe hypoglycemia: never.  Pancreatitis: never.   Pancreatic imaging: normal on 2012 CT.   SDOH: she refuses insulin.   Other: She did not tolerate Rybelsus (skin tingling and back pain).   Interval history: no cbg record, but states cbg's vary from 90-278.  pt reports she takes meds as rx'ed.   Past Medical History:  Diagnosis Date   Diabetes mellitus    GERD (gastroesophageal reflux disease)    HTN (hypertension)    Hypercholesteremia    Hypercholesterolemia    Migraines    Type II diabetes mellitus ([place])     Past Surgical History:  Procedure Laterality Date   ABDOMINAL HYSTERECTOMY  2008   Leiomyoma/menorrhagia   COLONOSCOPY  2012   COLONOSCOPY N/A 04/21/2017   Procedure: COLONOSCOPY;  Surgeon: Daneil Dolin, MD;  [facility];  Service: Endoscopy;  Laterality: N/A;  2:15pm   LASER ABLATION OF THE CERVIX     dysplasia    Social History   Socioeconomic History   Marital status: Single    Spouse name: Not on file   Number of children: Not on file   Years of education: Not on file   Highest education level: Not on file  Occupational History   Not on file  Tobacco Use   Smoking status: Never   Smokeless tobacco: Never  Vaping Use   Vaping Use: Never used  Substance and Sexual Activity   Alcohol use: No    Alcohol/week: 0.0 standard drinks   Drug use: No   Sexual activity: Yes    Comment: HYSTERECTOMY-1st intercourse 17 yo-5 partners  Other Topics Concern   Not on file  Social History Narrative   SINGLE, NO KIDS. WORKS RETAILS. HOBBIES: COOK, Barrister's clerk, [employer].   Social Determinants of Health   Financial Resource Strain: Not on file  Food Insecurity: Not on file  Transportation Needs: Not on file   Physical Activity: Not on file  Stress: Not on file  Social Connections: Not on file  Intimate Partner Violence: Not on file    Current Outpatient Medications on File Prior to Visit  Medication Sig Dispense Refill   aspirin EC 81 MG tablet Take 81 mg by mouth daily.     glimepiride (AMARYL) 2 MG tablet Take 2 tablets (4 mg total) by mouth daily with breakfast. 180 tablet 2   glucose blood (CONTOUR NEXT TEST) test strip 1 each by Other route daily. And lancets 1/day 100 each 3   guaiFENesin-codeine (ROBITUSSIN AC) 100-10 MG/5ML syrup Take 10 mLs by mouth 3 (three) times daily as needed for cough. 120 mL 0   lisinopril-hydrochlorothiazide (ZESTORETIC) 20-12.5 MG tablet Take 1 tablet by mouth once daily 90 tablet 0   metFORMIN (GLUCOPHAGE-XR) 500 MG 24 hr tablet TAKE 2 TABLETS BY MOUTH IN THE MORNING 180 tablet 3   pioglitazone (ACTOS) 45 MG tablet Take 1 tablet (45 mg total) by mouth daily. 90 tablet 3   Polyethyl Glycol-Propyl Glycol 0.4-0.3 % SOLN Place 1-2 drops into both eyes 3 (three) times daily as needed (for dry eyes.).     simvastatin (ZOCOR) 40 MG tablet TAKE 1 TABLET BY MOUTH AT BEDTIME 90 tablet 0   Vitamin D, Ergocalciferol, (DRISDOL)  1.25 MG (50000 UNIT) CAPS capsule Take 1 capsule (50,000 Units total) by mouth every 7 (seven) days. 12 capsule 0   No current facility-administered medications on file prior to visit.    No Known Allergies  Family History  Problem Relation Age of Onset   Colon polyps Maternal Aunt    Colon polyps Paternal Uncle    Colon cancer Maternal Aunt        in 77s when diagnosed.    Heart disease Father    Diabetes Father    Diabetes Mother    Diabetes Sister    Diabetes Brother    Heart disease Brother    Cancer Maternal Aunt        Pancreatic    BP 110/60    Pulse 83    Ht 5\' 5"  (1.651 m)    Wt 174 lb (78.9 kg)    SpO2 96%    BMI 28.96 kg/m    Review of Systems She denies hypoglycemia.      Objective:   Physical Exam VITAL SIGNS:   See vs page GENERAL: no distress.     Lab Results  Component Value Date   CREATININE 0.76 05/20/2021   BUN 8 05/20/2021   NA 138 05/20/2021   K 3.4 (L) 05/20/2021   CL 99 05/20/2021   CO2 31 05/20/2021   A1c=7.0%    Assessment & Plan:  Type 2 DM: well-controlled  Patient Instructions  Please continue the same 3 diabetes medications.   check your blood sugar once a day.  vary the time of day when you check, between before the 3 meals, and at bedtime.  also check if you have symptoms of your blood sugar being too high or too low.  please keep a record of the readings and bring it to your next appointment here (or you can bring the meter itself).  You can write it on any piece of paper.  please call us sooner if your blood sugar goes below 70, or if you have a lot of readings over 200.   Please come back for a follow-up appointment in 6 months.

## 2021-06-08 ENCOUNTER — Ambulatory Visit: Payer: Self-pay | Admitting: Endocrinology

## 2021-06-11 ENCOUNTER — Ambulatory Visit: Payer: BC Managed Care – PPO | Admitting: Radiology

## 2021-06-11 ENCOUNTER — Ambulatory Visit: Payer: Self-pay | Admitting: Endocrinology

## 2021-06-23 NOTE — Progress Notes (Signed)
56 y.o. G0P0 Single African American female here for annual exam.   ? ?She has had vulvar itching and burning for 3 days.  ? ?Will see her PCP today for various concerns of bilateral leg swelling.  ? ?Taking Vit D 2000 IU daily.  ? ?PCP:  Annye Asa, MD  ? ?No LMP recorded. Patient has had a hysterectomy.     ?  ?    ?Sexually active: Yes.    ?The current method of family planning is status post hysterectomy.    ?Exercising: No.   Some waking ?Smoker:  no ? ?Health Maintenance: ?Pap:  06-24-17 Neg, 02-01-13 Neg, 11-11-11 Neg:Neg HR HPV ?History of abnormal Pap:  Yes, Hx of laser treatment to cervix number of years ago for unknown grade of dysplasia. ?MMG:  05-29-21--no report. Normal per patient--[facility] ?Colonoscopy:  04-21-17 normal;next 10 years.  She thinks she is due this year with Dr. Sydell Axon at [facility].  ?BMD:   n/a  Result  n/a ?TDaP:  02-07-14 ?Gardasil:   n/a ?HIV:06-10-16 NR ?Hep C:06-10-16 Neg ?Screening Labs:   PCP. ? ? reports that she has never smoked. She has never used smokeless tobacco. She reports that she does not drink alcohol and does not use drugs. ? ?Past Medical History:  ?Diagnosis Date  ? Diabetes mellitus   ? GERD (gastroesophageal reflux disease)   ? HTN (hypertension)   ? Hypercholesteremia   ? Hypercholesterolemia   ? Migraines   ? Type II diabetes mellitus ([address])   ? ? ?Past Surgical History:  ?Procedure Laterality Date  ? ABDOMINAL HYSTERECTOMY  2008  ? Leiomyoma/menorrhagia  ? COLONOSCOPY  2012  ? COLONOSCOPY N/A 04/21/2017  ? Procedure: COLONOSCOPY;  Surgeon: Daneil Dolin, MD;  [facility];  Service: Endoscopy;  Laterality: N/A;  2:15pm  ? LASER ABLATION OF THE CERVIX    ? dysplasia  ? ? ?Current Outpatient Medications  ?Medication Sig Dispense Refill  ? aspirin EC 81 MG tablet Take 81 mg by mouth daily.    ? Cholecalciferol (VITAMIN D3) 50 MCG (2000 UT) CAPS Take 1 capsule by mouth daily.    ? glimepiride (AMARYL) 2 MG tablet Take 2 tablets (4 mg total) by mouth daily  with breakfast. 180 tablet 2  ? glucose blood (CONTOUR NEXT TEST) test strip 1 each by Other route daily. And lancets 1/day 100 each 3  ? guaiFENesin-codeine (ROBITUSSIN AC) 100-10 MG/5ML syrup Take 10 mLs by mouth 3 (three) times daily as needed for cough. 120 mL 0  ? lisinopril-hydrochlorothiazide (ZESTORETIC) 20-12.5 MG tablet Take 1 tablet by mouth once daily 90 tablet 0  ? metFORMIN (GLUCOPHAGE-XR) 500 MG 24 hr tablet TAKE 2 TABLETS BY MOUTH IN THE MORNING 180 tablet 3  ? pioglitazone (ACTOS) 45 MG tablet Take 1 tablet (45 mg total) by mouth daily. 90 tablet 3  ? Polyethyl Glycol-Propyl Glycol 0.4-0.3 % SOLN Place 1-2 drops into both eyes 3 (three) times daily as needed (for dry eyes.).    ? simvastatin (ZOCOR) 40 MG tablet TAKE 1 TABLET BY MOUTH AT BEDTIME 90 tablet 0  ? ?No current facility-administered medications for this visit.  ? ? ?Family History  ?Problem Relation Age of Onset  ? Diabetes Mother   ? Dementia Mother   ? Dementia Father   ? Heart disease Father   ? Diabetes Father   ? Diabetes Sister   ? Diabetes Brother   ? Heart disease Brother   ? Colon polyps Maternal Aunt   ?  Colon cancer Maternal Aunt   ?     in 50s when diagnosed.   ? Cancer Maternal Aunt   ?     Pancreatic  ? Colon polyps Paternal Uncle   ? ? ?Review of Systems  ?All other systems reviewed and are negative. ? ?Exam:   ?BP 116/60   Pulse 75   Ht 5' 4.5" (1.638 m)   Wt 172 lb (78 kg)   SpO2 100%   BMI 29.07 kg/m?     ?General appearance: alert, cooperative and appears stated age ?Head: normocephalic, without obvious abnormality, atraumatic ?Neck: no adenopathy, supple, symmetrical, trachea midline and thyroid normal to inspection and palpation ?Lungs: clear to auscultation bilaterally ?Breasts: normal appearance, no masses or tenderness, No nipple retraction or dimpling, No nipple discharge or bleeding, No axillary adenopathy ?Heart: regular rate and rhythm ?Abdomen: soft, non-tender; no masses, no organomegaly ?Extremities:  extremities normal, atraumatic, no cyanosis or edema ?Skin: skin color, texture, turgor normal. No rashes or lesions ?Lymph nodes: cervical, supraclavicular, and axillary nodes normal. ?Neurologic: grossly normal ? ?Pelvic: External genitalia:  hypopigmentation of labia minora and perineum.  Ecchymoses of the labia and clitoral region noted.  Skin break down on left labia majora.  ?             No abnormal inguinal nodes palpated. ?             Urethra:  normal appearing urethra with no masses, tenderness or lesions ?             Bartholins and Skenes: normal    ?             Vagina: normal appearing vagina with normal color and discharge, no lesions ?             Cervix:  absent ?             Pap taken: yes ?Bimanual Exam:  Uterus:  absent ?             Adnexa: no mass, fullness, tenderness ?             Rectal exam: yes.  Confirms. ?             Anus:  normal sphincter tone, no lesions ? ?Chaperone was present for exam:  Lovena Le, CMA ? ?Assessment:   ?Well woman visit with gynecologic exam. ?Status post TAH in 2008. ?Hx laser for cervical dysplasia of unknown grade approximately in 1998.  ?Vulvitis. Looks like lichen sclerosus.  ? ?Plan: ?Mammogram screening discussed. ?Self breast awareness reviewed. ?Pap and HR HPV collected today.  ?Guidelines for Calcium, Vitamin D, regular exercise program including cardiovascular and weight bearing exercise. ?We discussed her vulvar skin changes and lichen sclerosus as the likely cause.  ?Rx for valisone ointment.  ?Return for vulvar biopsy.  I did review potential coexistence of lichen sclerosus with vulvar dysplasia. ?Follow up annually and prn.  ? ?After visit summary provided.  ? ? ? ?

## 2021-06-24 ENCOUNTER — Ambulatory Visit (INDEPENDENT_AMBULATORY_CARE_PROVIDER_SITE_OTHER): Payer: Self-pay | Admitting: Obstetrics and Gynecology

## 2021-06-24 ENCOUNTER — Other Ambulatory Visit (HOSPITAL_COMMUNITY)
Admission: RE | Admit: 2021-06-24 | Discharge: 2021-06-24 | Disposition: A | Discharge status: Home / Self Care | Payer: Self-pay | Source: Ambulatory Visit | Attending: Obstetrics and Gynecology | Admitting: Obstetrics and Gynecology

## 2021-06-24 ENCOUNTER — Encounter: Payer: Self-pay | Admitting: Obstetrics and Gynecology

## 2021-06-24 ENCOUNTER — Ambulatory Visit (INDEPENDENT_AMBULATORY_CARE_PROVIDER_SITE_OTHER): Payer: Self-pay | Admitting: Family Medicine

## 2021-06-24 ENCOUNTER — Encounter: Payer: Self-pay | Admitting: Family Medicine

## 2021-06-24 ENCOUNTER — Other Ambulatory Visit: Payer: Self-pay

## 2021-06-24 VITALS — BP 110/68 | HR 88 | Temp 97.3°F | Resp 16 | Wt 175.8 lb

## 2021-06-24 VITALS — BP 116/60 | HR 75 | Ht 64.5 in | Wt 172.0 lb

## 2021-06-24 DIAGNOSIS — Z01419 Encounter for gynecological examination (general) (routine) without abnormal findings: Secondary | ICD-10-CM

## 2021-06-24 DIAGNOSIS — M25561 Pain in right knee: Secondary | ICD-10-CM

## 2021-06-24 DIAGNOSIS — N762 Acute vulvitis: Secondary | ICD-10-CM

## 2021-06-24 DIAGNOSIS — Z87410 Personal history of cervical dysplasia: Secondary | ICD-10-CM

## 2021-06-24 DIAGNOSIS — M7701 Medial epicondylitis, right elbow: Secondary | ICD-10-CM

## 2021-06-24 DIAGNOSIS — M25461 Effusion, right knee: Secondary | ICD-10-CM

## 2021-06-24 MED ORDER — BETAMETHASONE VALERATE 0.1 % EX OINT: 1.0000 "application " | TOPICAL_OINTMENT | Freq: Two times a day (BID) | CUTANEOUS | 0 refills | Status: DC

## 2021-06-24 NOTE — Progress Notes (Signed)
? ?  Subjective:  ? ? Patient ID: Dana Payne, female    DOB: 12-30-1965, 56 y.o.   MRN: [id number] ? ?HPI ?Joint swelling- bilateral knees and ankles but R>L.  Noticed last week and swelling lasted all week.  Pt reports she had been eating a lot of takeout.  Reports her salt intake had been higher than usual.  Sxs improved w/ increased water intake.   ? ?R elbow pain- pt reports area is sore to touch, occasionally painful to move.  Started last week.  Doesn't recall an injury.  Pt has been picking up boxes and packing.  Pt does note some weakness. ? ? ?Review of Systems ?For ROS see HPI  ? ?This visit occurred during the SARS-CoV-2 public health emergency.  Safety protocols were in place, including screening questions prior to the visit, additional usage of staff PPE, and extensive cleaning of exam room while observing appropriate contact time as indicated for disinfecting solutions.   ?   ?Objective:  ? Physical Exam ?Vitals reviewed.  ?Constitutional:   ?   General: She is not in acute distress. ?   Appearance: Normal appearance. She is not ill-appearing.  ?HENT:  ?   Head: Normocephalic and atraumatic.  ?Musculoskeletal:     ?   General: Swelling (swelling behind R knee consistent w/ Baker's cyst) and tenderness (TTP over R medial epicondyle) present.  ?Skin: ?   General: Skin is warm and dry.  ?   Findings: No bruising, erythema, lesion or rash.  ?Neurological:  ?   General: No focal deficit present.  ?   Mental Status: She is alert and oriented to person, place, and time.  ?Psychiatric:     ?   Mood and Affect: Mood normal.     ?   Behavior: Behavior normal.     ?   Thought Content: Thought content normal.  ? ? ? ? ? ?   ?Assessment & Plan:  ?Pain and swelling of R knee- new.  PE consistent w/ Baker's cyst.  Cannot say for certain w/o [place] but pt is currently w/o insurance so will hold off at this time.  Will treat w/ ice and Voltaren gel prn.  Pt to let me know if and when she is ready for [place].  Pt expressed  understanding and is in agreement w/ plan.  ? ?Medial epicondylitis- new.  Reviewed dx and tx w/ pt.  Will start OTC Voltaren gel and ice.  Encouraged relative rest and to avoid repetitive motions if possible.  Pt expressed understanding and is in agreement w/ plan.  ? ?

## 2021-06-24 NOTE — Patient Instructions (Signed)
Follow up as needed or as scheduled ?Get OTC Voltaren gel and apply to the elbow 3-4x/day ?ICE the elbow and knee ?Try and limit your salt intake and drink LOTS of water ?Let me know when you get your insurance and we can ultrasound the knee ?Call with any questions or concerns ?Stay Safe!  Stay Healthy! ?Happy Spring!!! ?

## 2021-06-24 NOTE — Patient Instructions (Signed)
EXERCISE AND DIET:  We recommended that you start or continue a regular exercise program for good health. Regular exercise means any activity that makes your heart beat faster and makes you sweat.  We recommend exercising at least 30 minutes per day at least 3 days a week, preferably 4 or 5.  We also recommend a diet low in fat and sugar.  Inactivity, poor dietary choices and obesity can cause diabetes, heart attack, stroke, and kidney damage, among others.   ? ?ALCOHOL AND SMOKING:  Women should limit their alcohol intake to no more than 7 drinks/beers/glasses of wine (combined, not each!) per week. Moderation of alcohol intake to this level decreases your risk of breast cancer and liver damage. And of course, no recreational drugs are part of a healthy lifestyle.  And absolutely no smoking or even second hand smoke. Most people know smoking can cause heart and lung diseases, but did you know it also contributes to weakening of your bones? Aging of your skin?  Yellowing of your teeth and nails? ? ?CALCIUM AND VITAMIN D:  Adequate intake of calcium and Vitamin D are recommended.  The recommendations for exact amounts of these supplements seem to change often, but generally speaking 600 mg of calcium (either carbonate or citrate) and 800 units of Vitamin D per day seems prudent. Certain women may benefit from higher intake of Vitamin D.  If you are among these women, your doctor will have told you during your visit.   ? ?PAP SMEARS:  Pap smears, to check for cervical cancer or precancers,  have traditionally been done yearly, although recent scientific advances have shown that most women can have pap smears less often.  However, every woman still should have a physical exam from her gynecologist every year. It will include a breast check, inspection of the vulva and vagina to check for abnormal growths or skin changes, a visual exam of the cervix, and then an exam to evaluate the size and shape of the uterus and  ovaries.  And after 57 years of age, a rectal exam is indicated to check for rectal cancers. We will also provide age appropriate advice regarding health maintenance, like when you should have certain vaccines, screening for sexually transmitted diseases, bone density testing, colonoscopy, mammograms, etc.  ? ?MAMMOGRAMS:  All women over 57 years old should have a yearly mammogram. Many facilities now offer a "3D" mammogram, which may cost around $50 extra out of pocket. If possible,  we recommend you accept the option to have the 3D mammogram performed.  It both reduces the number of women who will be called back for extra views which then turn out to be normal, and it is better than the routine mammogram at detecting truly abnormal areas.   ? ?COLONOSCOPY:  Colonoscopy to screen for colon cancer is recommended for all women at age 57.  We know, you hate the idea of the prep.  We agree, BUT, having colon cancer and not knowing it is worse!!  Colon cancer so often starts as a polyp that can be seen and removed at colonscopy, which can quite literally save your life!  And if your first colonoscopy is normal and you have no family history of colon cancer, most women don't have to have it again for 10 years.  Once every ten years, you can do something that may end up saving your life, right?  We will be happy to help you get it scheduled when you are ready.  Be sure to check your insurance coverage so you understand how much it will cost.  It may be covered as a preventative service at no cost, but you should check your particular policy.   ? ?Lichen Sclerosus ?Lichen sclerosus is a skin problem. It can happen on any part of the body, but it commonly involves the anal and genital areas. It can cause itching and discomfort in these areas. Treatment can help to control symptoms. When the genital area is affected, getting treatment is important because the condition can cause scarring that may lead to other problems if  left untreated. ?What are the causes? ?The cause of this condition is not known. It may be related to an overactive immune system or a lack of certain hormones. Lichen sclerosus is not an infection or a fungus, and it is not passed from one person to another (non-contagious). ?What increases the risk? ?The following factors may make you more likely to develop this condition: ?You are a woman who has reached menopause. ?You are a man who was not circumcised. ?This condition may also develop for the first time in children, usually before they enter puberty. ?What are the signs or symptoms? ?Symptoms of this condition include: ?White areas (plaques) on the skin that may be thin and wrinkled, or thickened. ?Red and swollen patches (lesions) on the skin. ?Tears or cracks in the skin. ?Bruising. ?Blood blisters. ?Severe itching. ?Pain, itching, or burning when urinating. ?Constipation is also common in children with lichen sclerosus, but can be seen in adults. ?How is this diagnosed? ?This condition may be diagnosed with a physical exam. In some cases, a tissue sample may be removed to be checked under a microscope (biopsy). ?How is this treated? ?This condition may be treated with: ?Topical steroids. These are medicated creams or ointments that are applied over the affected areas. ?Medicines that are taken by mouth. ?Topical immunotherapy. These are medicated creams or ointments that are applied over the affected areas. They stimulate your immune system to fight the skin condition. This may be used if steroids are not effective. ?Surgery. This may be needed in more severe cases that are causing problems such as scarring. ?Follow these instructions at home: ?Medicines ?Take over-the-counter and prescription medicines only as told by your health care provider. ?Use creams or ointments as told by your health care provider. ?Skin care ?Do not scratch the affected areas of skin. ?If you are a woman, be sure to keep the  vaginal area as clean and dry as possible. ?Clean the affected area of skin gently with water only. Pat skin dry and avoid the use of rough towels or toilet paper. ?Avoid irritating skin products, including soap and scented lotions. Use emollient creams as directed by your health care provider to help reduce itching. ?General instructions ?Keep all follow-up visits. This is important. ?Your condition may cause constipation. To prevent or treat constipation, you may need to: ?Drink enough fluid to keep your urine pale yellow. ?Take over-the-counter or prescription medicines. ?Eat foods that are high in fiber, such as beans, whole grains, and fresh fruits and vegetables. ?Limit foods that are high in fat and processed sugars, such as fried or sweet foods. ?Contact a health care provider if: ?You have increasing redness, swelling, or pain in the affected area. ?You have fluid, blood, or pus coming from the affected area. ?You have new lesions on your skin. ?You have a fever. ?You have pain during sex. ?Get help right away if: ?You develop  severe pain or burning in the affected areas, especially in the genital area. ?Summary ?Lichen sclerosus is a skin problem. When the genital area is affected, getting treatment is important because the condition can cause scarring that may lead to other problems if left untreated. ?This condition is usually treated with medicated creams or ointments (topical steroids) that are applied over the affected areas. ?Take or use over-the-counter and prescription medicines only as told by your health care provider. ?Contact a health care provider if you have new lesions on your skin, have pain during sex, or have increasing redness, swelling, or pain in the affected area. ?Keep all follow-up visits. This is important. ?This information is not intended to replace advice given to you by your health care provider. Make sure you discuss any questions you have with your health care  provider. ?Document Revised: 08/25/2019 Document Reviewed: 08/25/2019 ?Elsevier Patient Education ? 2022 [place]. ? ? ? ?

## 2021-06-26 LAB — CYTOLOGY - PAP
Comment: NEGATIVE
Diagnosis: NEGATIVE
High risk HPV: NEGATIVE

## 2021-07-03 ENCOUNTER — Telehealth: Payer: Self-pay | Admitting: *Deleted

## 2021-07-03 NOTE — Telephone Encounter (Signed)
-----   Message from Netta Corrigan, [employer] sent at 07/03/2021  1:39 PM EST ----- ?This pt left VM in triage box stating that she was returning your call about a procedure that Dr. Quincy Simmonds wants her to have done. States that she is paying out of pocket so wants to know what out of pocket costs would be. Also states that if she decides to do it--she will be in [place] next Tuesday and states she would prefer afternoon if available. States if you call her back and she is not home-can leave msg on VM. Thanks.  ? ?

## 2021-07-03 NOTE — Telephone Encounter (Signed)
Per review of Epic and 06/24/21 AEX, a vulvar biopsy was ordered.  ? ?Routing to [employer] for review of benefits.  ? ?Cc: [facility] Triage ?

## 2021-07-08 ENCOUNTER — Other Ambulatory Visit: Payer: Self-pay | Admitting: *Deleted

## 2021-07-08 MED ORDER — TERCONAZOLE 0.4 % VA CREA: 1.0000 | TOPICAL_CREAM | Freq: Every day | VAGINAL | 0 refills | Status: DC

## 2021-07-08 NOTE — Telephone Encounter (Signed)
Patient informed of results. She reports she has not heard back about the estimated out of pocket cost for vulvar biopsy. She would like an idea of what the cost would be before scheduling.  ? ?Encounter forwarded to [facility] to check and get back with patient.  ?

## 2021-07-08 NOTE — Telephone Encounter (Signed)
Call to patient. Per DPR, OK to leave message on voicemail. ?  ?Left voicemail requesting a return call to review benefits for Recommended Vulvar Biopsy with Josefa Half, MD, Cherlynn June.  ?

## 2021-07-13 NOTE — Telephone Encounter (Signed)
Per [place], patient will call back in two weeks to schedule appointment. Will close encounter and wait for patient to return call. ? ?Encounter closed. ?

## 2021-07-28 NOTE — Progress Notes (Signed)
GYNECOLOGY  VISIT ?  ?HPI: ?56 y.o.   Single  African American  female   ?G0P0 with No LMP recorded. Patient has had a hysterectomy.   ?here for  vulvar biopsy due to vulvitis hypopigmentation of the vulva. ? ?She received an Rx for Valisone ointment at her visit on 06/24/21, but she has not picked up yet from the pharmacy.  ?She was waiting for them to fill the order.  ? ?GYNECOLOGIC HISTORY: ?No LMP recorded. Patient has had a hysterectomy. ?Contraception:  PMP ?Menopausal hormone therapy:  none ?Last mammogram:  05-29-21 normal Bi Rad 1 ?Last pap smear:   06-24-21 normal ?       ?OB History   ? ? Gravida  ?0  ? Para  ?   ? Term  ?   ? Preterm  ?   ? AB  ?   ? Living  ?0  ?  ? ? SAB  ?   ? IAB  ?   ? Ectopic  ?   ? Multiple  ?   ? Live Births  ?   ?   ?  ?  ?    ? ?Patient Active Problem List  ? Diagnosis Date Noted  ? Overweight (BMI 25.0-29.9) 09/09/2020  ? History of 2019 novel coronavirus disease (COVID-19) 04/19/2019  ? H/O vitamin D deficiency 03/26/2016  ? Insomnia 03/26/2016  ? Physical exam 11/07/2014  ? Elevated serum creatinine 04/07/2012  ? Chest pain 11/11/2011  ? Anxiety and depression 02/25/2011  ? Hemangioma of liver 11/09/2010  ? Rectal bleeding 10/01/2010  ? Goiter 02/25/2010  ? DM type 2 with diabetic peripheral neuropathy ([place]) 05/16/2009  ? Hyperlipidemia 05/16/2009  ? COUGH, CHRONIC 03/19/2009  ? Allergic rhinitis 10/03/2008  ? COMMON MIGRAINE 08/08/2008  ? Essential hypertension 08/08/2008  ? UNSPECIFIED CIRCULATORY SYSTEM DISORDER 08/08/2008  ? ? ?Past Medical History:  ?Diagnosis Date  ? Diabetes mellitus   ? GERD (gastroesophageal reflux disease)   ? HTN (hypertension)   ? Hypercholesteremia   ? Hypercholesterolemia   ? Migraines   ? Type II diabetes mellitus ([place])   ? ? ?Past Surgical History:  ?Procedure Laterality Date  ? ABDOMINAL HYSTERECTOMY  2008  ? Leiomyoma/menorrhagia  ? COLONOSCOPY  2012  ? COLONOSCOPY N/A 04/21/2017  ? Procedure: COLONOSCOPY;  Surgeon: Daneil Dolin, MD;   [facility];  Service: Endoscopy;  Laterality: N/A;  2:15pm  ? LASER ABLATION OF THE CERVIX    ? dysplasia  ? ? ?Current Outpatient Medications  ?Medication Sig Dispense Refill  ? aspirin EC 81 MG tablet Take 81 mg by mouth daily.    ? betamethasone valerate ointment (VALISONE) 0.1 % Apply 1 application topically 2 (two) times daily. Use twice daily for a flare.  Use twice a week at bedtime for maintenance dosing. 30 g 0  ? glimepiride (AMARYL) 2 MG tablet Take 2 tablets (4 mg total) by mouth daily with breakfast. 180 tablet 2  ? glucose blood (CONTOUR NEXT TEST) test strip 1 each by Other route daily. And lancets 1/day 100 each 3  ? guaiFENesin-codeine (ROBITUSSIN AC) 100-10 MG/5ML syrup Take 10 mLs by mouth 3 (three) times daily as needed for cough. 120 mL 0  ? lisinopril-hydrochlorothiazide (ZESTORETIC) 20-12.5 MG tablet Take 1 tablet by mouth once daily 90 tablet 0  ? metFORMIN (GLUCOPHAGE-XR) 500 MG 24 hr tablet TAKE 2 TABLETS BY MOUTH IN THE MORNING 180 tablet 3  ? pioglitazone (ACTOS) 45 MG tablet Take  1 tablet (45 mg total) by mouth daily. 90 tablet 3  ? Polyethyl Glycol-Propyl Glycol 0.4-0.3 % SOLN Place 1-2 drops into both eyes 3 (three) times daily as needed (for dry eyes.).    ? simvastatin (ZOCOR) 40 MG tablet TAKE 1 TABLET BY MOUTH AT BEDTIME 90 tablet 0  ? Cholecalciferol (VITAMIN D3) 50 MCG (2000 UT) CAPS Take 1 capsule by mouth daily. (Patient not taking: Reported on 07/30/2021)    ? terconazole (TERAZOL 7) 0.4 % vaginal cream Place 1 applicator vaginally at bedtime. For nights (Patient not taking: Reported on 07/30/2021) 45 g 0  ? ?No current facility-administered medications for this visit.  ?  ? ?ALLERGIES: Patient has no known allergies. ? ?Family History  ?Problem Relation Age of Onset  ? Diabetes Mother   ? Dementia Mother   ? Dementia Father   ? Heart disease Father   ? Diabetes Father   ? Diabetes Sister   ? Diabetes Brother   ? Heart disease Brother   ? Colon polyps Maternal Aunt    ? Colon cancer Maternal Aunt   ?     in 58s when diagnosed.   ? Cancer Maternal Aunt   ?     Pancreatic  ? Colon polyps Paternal Uncle   ? ? ?Social History  ? ?Socioeconomic History  ? Marital status: Single  ?  Spouse name: Not on file  ? Number of children: Not on file  ? Years of education: Not on file  ? Highest education level: Not on file  ?Occupational History  ? Not on file  ?Tobacco Use  ? Smoking status: Never  ? Smokeless tobacco: Never  ?Vaping Use  ? Vaping Use: Never used  ?Substance and Sexual Activity  ? Alcohol use: No  ?  Alcohol/week: 0.0 standard drinks  ? Drug use: No  ? Sexual activity: Yes  ?  Comment: HYSTERECTOMY-1st intercourse 11 yo-5 partners  ?Other Topics Concern  ? Not on file  ?Social History Narrative  ? 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  ? ?Social Determinants of Health  ? ?Financial Resource Strain: Not on file  ?Food Insecurity: Not on file  ?Transportation Needs: Not on file  ?Physical Activity: Not on file  ?Stress: Not on file  ?Social Connections: Not on file  ?Intimate Partner Violence: Not on file  ? ? ?Review of Systems  See HPI.  ? ?PHYSICAL EXAMINATION:   ? ?BP 124/76 (BP Location: Right Arm, Patient Position: Sitting, Cuff Size: Normal)     ?General appearance: alert, cooperative and appears stated age ?  ?Pelvic: External genitalia:  hypopigmentation of fused bilateral labia minora and majora on ipsilateral sides.  Hypopigmentation of the perineum.  ?             Urethra:  normal appearing urethra  ? ?Vulvar biopsies.  ?Consent done.  ?Hibiclens prep.  ?Local 1% lidocaine, lot [id number], exp 12/2024. ?3 mm punch biopsies.  #1 perineum, #2 left labia (fusion of minora with majora) ?Silver nitrate used.  ?Minimal EBL. ?No complications.  ? ?Chaperone was present for exam:  Onalee Hua.  ? ?ASSESSMENT ? ?Vulvitis.  I suspect lichen sclerosus.  ? ?PLAN ? ?Follow up vulvar biopsies. ?Valisone ointment already prescribed.  ?  ?An After Visit Summary  was printed and given to the patient. ? ? ?  ?

## 2021-07-30 ENCOUNTER — Other Ambulatory Visit (HOSPITAL_COMMUNITY)
Admission: RE | Admit: 2021-07-30 | Discharge: 2021-07-30 | Disposition: A | Discharge status: Home / Self Care | Payer: Self-pay | Source: Ambulatory Visit | Attending: Obstetrics and Gynecology | Admitting: Obstetrics and Gynecology

## 2021-07-30 ENCOUNTER — Ambulatory Visit (INDEPENDENT_AMBULATORY_CARE_PROVIDER_SITE_OTHER): Payer: Self-pay | Admitting: Obstetrics and Gynecology

## 2021-07-30 DIAGNOSIS — N762 Acute vulvitis: Secondary | ICD-10-CM

## 2021-07-30 NOTE — Patient Instructions (Signed)
Vulva Biopsy, Care After ?This sheet gives you information about how to care for yourself after your procedure. Your doctor may also give you more specific instructions. If you have problems or questions, contact your doctor. ?What can I expect after the procedure? ?After the procedure, it is common to have: ?Slight bleeding from the biopsy site. ?Soreness at the biopsy site. ?Follow these instructions at home: ?Biopsy site care ? ?Follow instructions from your doctor about how to take care of your biopsy site. Make sure you: ?Clean the area using water and mild soap two times a day or as told by your doctor. Gently pat the area dry. ?If you were prescribed an antibiotic ointment, apply it as told by your doctor. Do not stop using the antibiotic even if your condition gets better. ?Take a warm water bath (sitz bath) as needed to help with pain. A sitz bath is taken while you are sitting down. The water should only come up to your hips and cover your butt. ?Leave stitches (sutures), skin glue, or skin tape (adhesive) strips in place. They may need to stay in place for 2 weeks or longer. If tape strips get loose and curl up, you may trim the loose edges. Do not remove tape strips completely unless your doctor says it is okay. ?Check your biopsy area every day for signs of infection. Check for: ?More redness, swelling, or pain. ?More fluid or blood. ?Warmth. ?Pus or a bad smell. ?Do not rub the biopsy area after peeing (urinating). ?Gently pat the area dry, or use a bottle filled with warm water (peri-bottle) to clean the area. ?Gently wipe from front to back. ?Lifestyle ?Wear loose, cotton underwear. ?Do not wear tight pants. ?Do not use a tampon, douche, or put anything in your vagina for at least 1 week or until your doctor says it is okay. ?Do not have sex for at least 1 week or until your doctor says it is okay. ?Do not exercise until your doctor says it is okay. ?Do not swim or use a hot tub until your doctor  says it is okay. You may shower or take a sitz bath. ?General instructions ?Take over-the-counter and prescription medicines only as told by your doctor. ?Use a sanitary pad until the bleeding stops. ?Keep all follow-up visits as told by your doctor. This is important. ?Contact a doctor if: ?You have more redness, swelling, or pain around your biopsy site. ?You have more fluid or blood coming from your biopsy site. ?Your biopsy site feels warm when you touch it. ?Medicines do not help with your pain. ?Get help right away if: ?You have a lot of bleeding from the vulva. ?You have pus or a bad smell coming from your biopsy site. ?You have a fever. ?You have pain in the lower belly (abdomen). ?Summary ?After the procedure, it is common to have slight bleeding and soreness at the biopsy site. ?Follow all instructions as told by your doctor. Clean the area with water and mild soap. Do not rub. Pat the area dry. ?Take sitz baths as needed. Leave any stitches in place. ?Check your biopsy site for infection. Signs include more redness, swelling, pain, fluid, or blood, or feeling warm when you touch it. ?Get help right away if you have a lot of bleeding, a fever, pus or a bad smell, or pain in your lower belly. ?This information is not intended to replace advice given to you by your health care provider. Make sure you discuss any  questions you have with your health care provider. ?Document Revised: 10/12/2017 Document Reviewed: 10/13/2017 ?Elsevier Patient Education ? [employer]. ?Lichen Sclerosus ?Lichen sclerosus is a skin problem. It can happen on any part of the body, but it commonly involves the anal and genital areas. It can cause itching and discomfort in these areas. Treatment can help to control symptoms. When the genital area is affected, getting treatment is important because the condition can cause scarring that may lead to other problems if left untreated. ?What are the causes? ?The cause of this condition  is not known. It may be related to an overactive immune system or a lack of certain hormones. Lichen sclerosus is not an infection or a fungus, and it is not passed from one person to another (non-contagious). ?What increases the risk? ?The following factors may make you more likely to develop this condition: ?You are a woman who has reached menopause. ?You are a man who was not circumcised. ?This condition may also develop for the first time in children, usually before they enter puberty. ?What are the signs or symptoms? ?Symptoms of this condition include: ?White areas (plaques) on the skin that may be thin and wrinkled, or thickened. ?Red and swollen patches (lesions) on the skin. ?Tears or cracks in the skin. ?Bruising. ?Blood blisters. ?Severe itching. ?Pain, itching, or burning when urinating. ?Constipation is also common in children with lichen sclerosus, but can be seen in adults. ?How is this diagnosed? ?This condition may be diagnosed with a physical exam. In some cases, a tissue sample may be removed to be checked under a microscope (biopsy). ?How is this treated? ?This condition may be treated with: ?Topical steroids. These are medicated creams or ointments that are applied over the affected areas. ?Medicines that are taken by mouth. ?Topical immunotherapy. These are medicated creams or ointments that are applied over the affected areas. They stimulate your immune system to fight the skin condition. This may be used if steroids are not effective. ?Surgery. This may be needed in more severe cases that are causing problems such as scarring. ?Follow these instructions at home: ?Medicines ?Take over-the-counter and prescription medicines only as told by your health care provider. ?Use creams or ointments as told by your health care provider. ?Skin care ?Do not scratch the affected areas of skin. ?If you are a woman, be sure to keep the vaginal area as clean and dry as possible. ?Clean the affected area of  skin gently with water only. Pat skin dry and avoid the use of rough towels or toilet paper. ?Avoid irritating skin products, including soap and scented lotions. Use emollient creams as directed by your health care provider to help reduce itching. ?General instructions ?Keep all follow-up visits. This is important. ?Your condition may cause constipation. To prevent or treat constipation, you may need to: ?Drink enough fluid to keep your urine pale yellow. ?Take over-the-counter or prescription medicines. ?Eat foods that are high in fiber, such as beans, whole grains, and fresh fruits and vegetables. ?Limit foods that are high in fat and processed sugars, such as fried or sweet foods. ?Contact a health care provider if: ?You have increasing redness, swelling, or pain in the affected area. ?You have fluid, blood, or pus coming from the affected area. ?You have new lesions on your skin. ?You have a fever. ?You have pain during sex. ?Get help right away if: ?You develop severe pain or burning in the affected areas, especially in the genital area. ?Summary ?Lichen sclerosus  is a skin problem. When the genital area is affected, getting treatment is important because the condition can cause scarring that may lead to other problems if left untreated. ?This condition is usually treated with medicated creams or ointments (topical steroids) that are applied over the affected areas. ?Take or use over-the-counter and prescription medicines only as told by your health care provider. ?Contact a health care provider if you have new lesions on your skin, have pain during sex, or have increasing redness, swelling, or pain in the affected area. ?Keep all follow-up visits. This is important. ?This information is not intended to replace advice given to you by your health care provider. Make sure you discuss any questions you have with your health care provider. ?Document Revised: 08/25/2019 Document Reviewed: 08/25/2019 ?Elsevier  Patient Education ? [place]. ? ?

## 2021-08-03 LAB — SURGICAL PATHOLOGY

## 2021-08-04 ENCOUNTER — Encounter: Payer: Self-pay | Admitting: Obstetrics and Gynecology

## 2021-08-06 ENCOUNTER — Encounter: Payer: Self-pay | Admitting: Obstetrics and Gynecology

## 2021-10-12 ENCOUNTER — Other Ambulatory Visit: Payer: Self-pay | Admitting: Family Medicine

## 2021-10-12 DIAGNOSIS — I10 Essential (primary) hypertension: Secondary | ICD-10-CM

## 2021-12-21 ENCOUNTER — Other Ambulatory Visit: Payer: Self-pay | Admitting: Family Medicine

## 2021-12-29 ENCOUNTER — Ambulatory Visit: Payer: Self-pay | Admitting: Family Medicine

## 2021-12-30 ENCOUNTER — Ambulatory Visit (INDEPENDENT_AMBULATORY_CARE_PROVIDER_SITE_OTHER): Payer: Self-pay | Admitting: Family Medicine

## 2021-12-30 ENCOUNTER — Encounter: Payer: Self-pay | Admitting: Family Medicine

## 2021-12-30 VITALS — BP 120/74 | HR 86 | Temp 98.0°F | Resp 16 | Ht 64.0 in | Wt 174.4 lb

## 2021-12-30 DIAGNOSIS — E1142 Type 2 diabetes mellitus with diabetic polyneuropathy: Secondary | ICD-10-CM

## 2021-12-30 DIAGNOSIS — I10 Essential (primary) hypertension: Secondary | ICD-10-CM

## 2021-12-30 DIAGNOSIS — E785 Hyperlipidemia, unspecified: Secondary | ICD-10-CM

## 2021-12-30 LAB — HEPATIC FUNCTION PANEL
ALT: 23 U/L (ref 0–35)
AST: 20 U/L (ref 0–37)
Albumin: 4.5 g/dL (ref 3.5–5.2)
Alkaline Phosphatase: 95 U/L (ref 39–117)
Bilirubin, Direct: 0.1 mg/dL (ref 0.0–0.3)
Total Bilirubin: 0.5 mg/dL (ref 0.2–1.2)
Total Protein: 7.5 g/dL (ref 6.0–8.3)

## 2021-12-30 LAB — BASIC METABOLIC PANEL
BUN: 7 mg/dL (ref 6–23)
CO2: 29 mEq/L (ref 19–32)
Calcium: 10 mg/dL (ref 8.4–10.5)
Chloride: 100 mEq/L (ref 96–112)
Creatinine, Ser: 0.79 mg/dL (ref 0.40–1.20)
GFR: 83.21 mL/min (ref >60.00)
Glucose, Bld: 272 mg/dL — ABNORMAL HIGH (ref 70–99)
Potassium: 4.4 mEq/L (ref 3.5–5.1)
Sodium: 137 mEq/L (ref 135–145)

## 2021-12-30 LAB — HEMOGLOBIN A1C: Hgb A1c MFr Bld: 9.9 % — ABNORMAL HIGH (ref 4.6–6.5)

## 2021-12-30 LAB — LIPID PANEL
Cholesterol: 177 mg/dL (ref 0–200)
HDL: 50.6 mg/dL (ref >39.00)
LDL Cholesterol: 108 mg/dL — ABNORMAL HIGH (ref 0–99)
NonHDL: 126.6
Total CHOL/HDL Ratio: 4
Triglycerides: 95 mg/dL (ref 0.0–149.0)
VLDL: 19 mg/dL (ref 0.0–40.0)

## 2021-12-30 NOTE — Progress Notes (Signed)
   Subjective:    Patient ID: Dana Payne, female    DOB: Jul 14, 1965, 56 y.o.   MRN: [id number]  HPI DM- chronic problem, on Glimepiride '2mg'$  daily, Metformin XR '1000mg'$  daily, Actos '45mg'$  daily (but not taking).  UTD on eye exam, foot exam.  Will get microalbumin today.  Denies symptomatic lows unless she forgets to eat.  No numbness/tingling of hands.  Some burning of feet.  Hyperlipidemia- chronic problem, on Simvastatin '40mg'$  daily.  No abd pain, N/V.  HTN- chronic problem, on Lisinopril HCTZ 20/12.'5mg'$  daily w/ excellent control.  No CP, SOB, HAs, visual changes, edema.     Review of Systems For ROS see HPI     Objective:   Physical Exam Vitals reviewed.  Constitutional:      General: She is not in acute distress.    Appearance: Normal appearance. She is well-developed. She is not ill-appearing.  HENT:     Head: Normocephalic and atraumatic.  Eyes:     Conjunctiva/sclera: Conjunctivae normal.     Pupils: Pupils are equal, round, and reactive to light.  Neck:     Thyroid: No thyromegaly.  Cardiovascular:     Rate and Rhythm: Normal rate and regular rhythm.     Pulses: Normal pulses.     Heart sounds: Normal heart sounds. No murmur heard. Pulmonary:     Effort: Pulmonary effort is normal. No respiratory distress.     Breath sounds: Normal breath sounds.  Abdominal:     General: There is no distension.     Palpations: Abdomen is soft.     Tenderness: There is no abdominal tenderness.  Musculoskeletal:     Cervical back: Normal range of motion and neck supple.     Right lower leg: No edema.     Left lower leg: No edema.  Lymphadenopathy:     Cervical: No cervical adenopathy.  Skin:    General: Skin is warm and dry.  Neurological:     General: No focal deficit present.     Mental Status: She is alert and oriented to person, place, and time.  Psychiatric:        Mood and Affect: Mood normal.        Behavior: Behavior normal.          Assessment & Plan:

## 2021-12-30 NOTE — Patient Instructions (Signed)
Follow up in 6 months to recheck sugar, blood pressure, cholesterol We'll notify you of your lab results and make any changes if needed Continue to work on healthy diet and regular exercise- you can do it! Start daily Claritin or Zyrtec to see if cough improves Call with any questions or concerns Stay Safe!  Stay Healthy!

## 2022-01-10 NOTE — Assessment & Plan Note (Signed)
Chronic problem, on Lisinopril HCTZ 20/12.'5mg'$  daily w/ good control.  Currently asymptomatic.  Check labs due to ACE and diuretic use but no anticipated med changes.  Will follow.

## 2022-01-10 NOTE — Assessment & Plan Note (Signed)
Chronic problem.  Currently on Glimepiride '2mg'$  daily, Metformin XR '1000mg'$  daily.  Not taking her Actos.  She does not have insurance at this time so medication options are limited.  UTD on eye exam, foot exam.  microalbumin ordered.  Check labs.  Adjust meds prn

## 2022-01-10 NOTE — Assessment & Plan Note (Signed)
Chronic problem.  On Simvastatin '40mg'$  daily w/o difficulty.  Check labs.  Adjust meds prn

## 2022-02-05 ENCOUNTER — Other Ambulatory Visit: Payer: Self-pay | Admitting: Family Medicine

## 2022-02-05 DIAGNOSIS — I10 Essential (primary) hypertension: Secondary | ICD-10-CM

## 2022-03-01 ENCOUNTER — Other Ambulatory Visit: Payer: Self-pay | Admitting: Internal Medicine

## 2022-03-01 NOTE — Progress Notes (Unsigned)
Name: Dana Payne  Age/ Sex: 56 y.o., female   MRN/ DOB: [id number], [birth year]     PCP: [name], MD   Reason for Endocrinology Evaluation: Type {NUMBERS 1 OR 2:522190} Diabetes Mellitus  Initial Endocrine Consultative Visit: ***    PATIENT IDENTIFIER: Dana Payne is a 56 y.o. female with a past medical history of ***. The patient has followed with Endocrinology clinic since *** for consultative assistance with management of her diabetes.  DIABETIC HISTORY:  Dana Payne was diagnosed with DM*** ***, and started insulin therapy approximately *** years after diagnosis. ***. Her hemoglobin A1c has ranged from *** in ***, peaking at *** in ***.   SUBJECTIVE:   During the last visit (***): ***  Today (03/01/2022): Dana Payne  She checks her blood sugars *** times daily. The patient has *** had hypoglycemic episodes since the last clinic visit, which typically occur *** x / - most often occuring ***. The patient is *** symptomatic with these episodes, with symptoms of {symptoms; hypoglycemia:9084048}.      HOME DIABETES REGIMEN:    Statin: *** ACE-I/ARB: *** Prior Diabetic Education: ***   METER DOWNLOAD SUMMARY: Date range evaluated: *** Fingerstick Blood Glucose Tests = *** Average Number Tests/Day = *** Overall Mean FS Glucose = *** Standard Deviation = ***  BG Ranges: Low = *** High = ***   Hypoglycemic Events/30 Days: BG < 50 = *** Episodes of symptomatic severe hypoglycemia = ***    DIABETIC COMPLICATIONS: Microvascular complications:  *** Denies:  Last Eye Exam: Completed   Macrovascular complications:  *** Denies: CAD, CVA, PVD   HISTORY:  Past Medical History:  Past Medical History:  Diagnosis Date   Diabetes mellitus    GERD (gastroesophageal reflux disease)    HTN (hypertension)    Hypercholesteremia    Hypercholesterolemia    Lichen sclerosus of vulva 2023   Migraines    Type II diabetes mellitus ([place])    Past Surgical  History:  Past Surgical History:  Procedure Laterality Date   ABDOMINAL HYSTERECTOMY  2008   Leiomyoma/menorrhagia   COLONOSCOPY  2012   COLONOSCOPY N/A 04/21/2017   Procedure: COLONOSCOPY;  Surgeon: Daneil Dolin, MD;  [facility];  Service: Endoscopy;  Laterality: N/A;  2:15pm   LASER ABLATION OF THE CERVIX     dysplasia   Social History:  reports that she has never smoked. She has never used smokeless tobacco. She reports that she does not drink alcohol and does not use drugs. Family History:  Family History  Problem Relation Age of Onset   Diabetes Mother    Dementia Mother    Dementia Father    Heart disease Father    Diabetes Father    Diabetes Sister    Diabetes Brother    Heart disease Brother    Colon polyps Maternal Aunt    Colon cancer Maternal Aunt        in 69s when diagnosed.    Cancer Maternal Aunt        Pancreatic   Colon polyps Paternal Uncle      HOME MEDICATIONS: Allergies as of 03/01/2022   No Known Allergies      Medication List        Accurate as of March 01, 2022 11:57 AM. If you have any questions, ask your nurse or doctor.          aspirin EC 81 MG tablet Take 81 mg by mouth daily.   betamethasone  valerate ointment 0.1 % Commonly known as: VALISONE Apply 1 application topically 2 (two) times daily. Use twice daily for a flare.  Use twice a week at bedtime for maintenance dosing.   Contour Next Test test strip Generic drug: glucose blood 1 each by Other route daily. And lancets 1/day   glimepiride 2 MG tablet Commonly known as: AMARYL Take 2 tablets (4 mg total) by mouth daily with breakfast.   lisinopril-hydrochlorothiazide 20-12.5 MG tablet Commonly known as: ZESTORETIC Take 1 tablet by mouth once daily   metFORMIN 500 MG 24 hr tablet Commonly known as: GLUCOPHAGE-XR TAKE 2 TABLETS BY MOUTH IN THE MORNING   Polyethyl Glycol-Propyl Glycol 0.4-0.3 % Soln Place 1-2 drops into both eyes 3 (three) times daily  as needed (for dry eyes.).   simvastatin 40 MG tablet Commonly known as: ZOCOR TAKE 1 TABLET BY MOUTH AT BEDTIME         OBJECTIVE:   Vital Signs: There were no vitals taken for this visit.  Wt Readings from Last 3 Encounters:  12/30/21 174 lb 6 oz (79.1 kg)  06/24/21 175 lb 12.8 oz (79.7 kg)  06/24/21 172 lb (78 kg)     Exam: General: Pt appears well and is in NAD  Neck: General: Supple without adenopathy. Thyroid: Thyroid size normal.  No goiter or nodules appreciated.   Lungs: Clear with good BS bilat   Heart: RRR   Abdomen:  soft, nontender  Extremities: No pretibial edema.   Skin: Normal texture and temperature to palpation. No rash noted. No Acanthosis nigricans/skin tags.   Neuro: MS is good with appropriate affect, pt is alert and Ox3    DM foot exam: Please see diabetic assessment flow-sheet detailed below:           DATA REVIEWED:  Lab Results  Component Value Date   HGBA1C 9.9 (H) 12/30/2021   HGBA1C 7.0 (A) 06/05/2021   HGBA1C 7.7 (A) 11/17/2020   Lab Results  Component Value Date   MICROALBUR 3.3 (H) 12/03/2009   LDLCALC 108 (H) 12/30/2021   CREATININE 0.79 12/30/2021   Lab Results  Component Value Date   MICRALBCREAT 2.3 12/03/2009     Lab Results  Component Value Date   CHOL 177 12/30/2021   HDL 50.60 12/30/2021   LDLCALC 108 (H) 12/30/2021   LDLDIRECT 84.0 10/24/2019   TRIG 95.0 12/30/2021   CHOLHDL 4 12/30/2021         ASSESSMENT / PLAN / RECOMMENDATIONS:   1) Type {NUMBERS 1 OR 2:522190} Diabetes Mellitus, ***controlled, With *** complications - Most recent A1c of *** %. Goal A1c < *** %.  ***  Plan: MEDICATIONS: ***  EDUCATION / INSTRUCTIONS: BG monitoring instructions: Patient is instructed to check her blood sugars *** times a day, ***. Call [facility] if: BG persistently < 70  I reviewed the Rule of 15 for the treatment of hypoglycemia in detail with the patient. Literature supplied.    2)  Diabetic complications:  Eye: Does *** have known diabetic retinopathy.  Neuro/ Feet: Does *** have known diabetic peripheral neuropathy .  Renal: Patient does *** have known baseline CKD. She   is *** on an ACEI/ARB at present.    3) Lipids: Patient is *** on a statin.  4) Hypertension: *** at goal of < 140/90 mmHg.    F/U in ***    Signed electronically by: Mack Guise, MD  [facility]  [facility] [address] [place]  [place], [place] [address] Phone: [phone] FAX: [phone]   CC: [name], MD [address] [place] [address] Phone: [phone]  Fax: [phone]  Return to Endocrinology clinic as below: Future Appointments  Date Time Provider [place]  03/23/2022 12:10 PM Alika Eppes, Melanie Crazier, MD [id number] None

## 2022-03-23 ENCOUNTER — Ambulatory Visit (INDEPENDENT_AMBULATORY_CARE_PROVIDER_SITE_OTHER): Payer: Self-pay | Admitting: Internal Medicine

## 2022-03-23 ENCOUNTER — Encounter: Payer: Self-pay | Admitting: Internal Medicine

## 2022-03-23 VITALS — BP 120/72 | HR 92 | Ht 64.0 in | Wt 165.4 lb

## 2022-03-23 DIAGNOSIS — E1142 Type 2 diabetes mellitus with diabetic polyneuropathy: Secondary | ICD-10-CM

## 2022-03-23 DIAGNOSIS — E785 Hyperlipidemia, unspecified: Secondary | ICD-10-CM

## 2022-03-23 DIAGNOSIS — E1165 Type 2 diabetes mellitus with hyperglycemia: Secondary | ICD-10-CM | POA: Insufficient documentation

## 2022-03-23 LAB — POCT GLYCOSYLATED HEMOGLOBIN (HGB A1C): Hemoglobin A1C: 12.7 % — AB (ref 4.0–5.6)

## 2022-03-23 MED ORDER — GLIPIZIDE 5 MG PO TABS: 5.0000 mg | ORAL_TABLET | Freq: Two times a day (BID) | ORAL | 3 refills | Status: DC

## 2022-03-23 MED ORDER — METFORMIN HCL ER 500 MG PO TB24
ORAL_TABLET | ORAL | 3 refills | Status: DC
Start: 2022-03-23 — End: 2023-04-01

## 2022-03-23 MED ORDER — PIOGLITAZONE HCL 45 MG PO TABS: 45.0000 mg | ORAL_TABLET | Freq: Every day | ORAL | 3 refills | Status: DC

## 2022-03-23 NOTE — Patient Instructions (Signed)
Stop Glimepiride  Start Glipizide 5 mg, 1 tablet before Breakfast and 1 tablet before supper Take Metformin 500 mg, 2 tablets daily  Restart Pioglitazone 45 mg daily      HOW TO TREAT LOW BLOOD SUGARS (Blood sugar LESS THAN 70 MG/DL) Please follow the RULE OF 15 for the treatment of hypoglycemia treatment (when your (blood sugars are less than 70 mg/dL)   STEP 1: Take 15 grams of carbohydrates when your blood sugar is low, which includes:  3-4 GLUCOSE TABS  OR 3-4 OZ OF JUICE OR REGULAR SODA OR ONE TUBE OF GLUCOSE GEL    STEP 2: RECHECK blood sugar in 15 MINUTES STEP 3: If your blood sugar is still low at the 15 minute recheck --> then, go back to STEP 1 and treat AGAIN with another 15 grams of carbohydrates.

## 2022-03-23 NOTE — Progress Notes (Signed)
Name: Dana Payne  Age/ Sex: 56 y.o., female   MRN/ DOB: [id number], 03-11-1966     PCP: [name], MD   Reason for Endocrinology Evaluation: Type 2 Diabetes Mellitus  Initial Endocrine Consultative Visit: 07/01/2016    PATIENT IDENTIFIER: Ms. Dana Payne is a 56 y.o. female with a past medical history of DM, HTN, and dyslipidemia. The patient has followed with Endocrinology clinic since 07/01/2016 for consultative assistance with management of her diabetes.     DIABETIC HISTORY:  Dana Payne was diagnosed with DM 2014.  She reported vomiting to [facility]. Her hemoglobin A1c has ranged from 7.0% in 2023, peaking at 11.8% in 2018.  She was followed by Dr. Loanne Drilling from 2018 until February 2023  SUBJECTIVE:   During the last visit (06/05/2021): saw Dr. Loanne Drilling   Today (03/23/2022): Dana Payne is here for follow-up on diabetes management.  She checks her blood sugars occasionally but has not been checking glucose as she has been out of strips. The patient has not  had hypoglycemic episodes since the last clinic visit.   She has been out of glycemic agents for months  Denies nausea or vomiting  Has loose stools lately   She declines insulin  She eats 2-3 meals a day , she does drink sugar-sweetened beverages     HOME DIABETES REGIMEN:  Metformin 500 mg XR 2 tabs daily  Glimepiride 2 mg, 2 tabs daily Pioglitazone 45 mg daily     Statin: Yes ACE-I/ARB: Yes    METER DOWNLOAD SUMMARY: Did not bring     DIABETIC COMPLICATIONS: Microvascular complications:  neuropathy Denies: CKD Last Eye Exam: Completed 06/2021  Macrovascular complications:   Denies: CAD, CVA, PVD   HISTORY:  Past Medical History:  Past Medical History:  Diagnosis Date   Diabetes mellitus    GERD (gastroesophageal reflux disease)    HTN (hypertension)    Hypercholesteremia    Hypercholesterolemia    Lichen sclerosus of vulva 2023   Migraines    Type II diabetes mellitus ([place])     Past Surgical History:  Past Surgical History:  Procedure Laterality Date   ABDOMINAL HYSTERECTOMY  2008   Leiomyoma/menorrhagia   COLONOSCOPY  2012   COLONOSCOPY N/A 04/21/2017   Procedure: COLONOSCOPY;  Surgeon: Daneil Dolin, MD;  [facility];  Service: Endoscopy;  Laterality: N/A;  2:15pm   LASER ABLATION OF THE CERVIX     dysplasia   Social History:  reports that she has never smoked. She has never used smokeless tobacco. She reports that she does not drink alcohol and does not use drugs. Family History:  Family History  Problem Relation Age of Onset   Diabetes Mother    Dementia Mother    Dementia Father    Heart disease Father    Diabetes Father    Diabetes Sister    Diabetes Brother    Heart disease Brother    Colon polyps Maternal Aunt    Colon cancer Maternal Aunt        in 51s when diagnosed.    Cancer Maternal Aunt        Pancreatic   Colon polyps Paternal Uncle      HOME MEDICATIONS: Allergies as of 03/23/2022   No Known Allergies      Medication List        Accurate as of March 23, 2022  3:18 PM. If you have any questions, ask your nurse or doctor.  STOP taking these medications    glimepiride 2 MG tablet Commonly known as: AMARYL Stopped by: Dorita Sciara, MD       TAKE these medications    aspirin EC 81 MG tablet Take 81 mg by mouth daily.   betamethasone valerate ointment 0.1 % Commonly known as: VALISONE Apply 1 application topically 2 (two) times daily. Use twice daily for a flare.  Use twice a week at bedtime for maintenance dosing.   Contour Next Test test strip Generic drug: glucose blood 1 each by Other route daily. And lancets 1/day   glipiZIDE 5 MG tablet Commonly known as: GLUCOTROL Take 1 tablet (5 mg total) by mouth 2 (two) times daily before a meal. Started by: Dorita Sciara, MD   lisinopril-hydrochlorothiazide 20-12.5 MG tablet Commonly known as: ZESTORETIC Take 1  tablet by mouth once daily   metFORMIN 500 MG 24 hr tablet Commonly known as: GLUCOPHAGE-XR TAKE 2 TABLETS BY MOUTH IN THE MORNING   pioglitazone 45 MG tablet Commonly known as: ACTOS Take 1 tablet (45 mg total) by mouth daily.   Polyethyl Glycol-Propyl Glycol 0.4-0.3 % Soln Place 1-2 drops into both eyes 3 (three) times daily as needed (for dry eyes.).   simvastatin 40 MG tablet Commonly known as: ZOCOR TAKE 1 TABLET BY MOUTH AT BEDTIME         OBJECTIVE:   Vital Signs: BP 120/72 (BP Location: Right Arm, Patient Position: Sitting, Cuff Size: Normal)   Pulse 92   Ht '5\' 4"'$  (1.626 m)   Wt 165 lb 6.4 oz (75 kg)   SpO2 97%   BMI 28.39 kg/m   Wt Readings from Last 3 Encounters:  03/23/22 165 lb 6.4 oz (75 kg)  12/30/21 174 lb 6 oz (79.1 kg)  06/24/21 175 lb 12.8 oz (79.7 kg)     Exam: General: Pt appears well and is in NAD  Neck: General: Supple without adenopathy. Thyroid: Thyroid size normal.  No goiter or nodules appreciated.   Lungs: Clear with good BS bilat   Heart: RRR   Abdomen:  soft, nontender  Extremities: No pretibial edema.   Neuro: MS is good with appropriate affect, pt is alert and Ox3       DATA REVIEWED:  Lab Results  Component Value Date   HGBA1C 12.7 (A) 03/23/2022   HGBA1C 9.9 (H) 12/30/2021   HGBA1C 7.0 (A) 06/05/2021    Latest Reference Range & Units 12/30/21 11:19  Sodium 135 - 145 mEq/L 137  Potassium 3.5 - 5.1 mEq/L 4.4  Chloride 96 - 112 mEq/L 100  CO2 19 - 32 mEq/L 29  Glucose 70 - 99 mg/dL 272 (H)  BUN 6 - 23 mg/dL 7  Creatinine 0.40 - 1.20 mg/dL 0.79  Calcium 8.4 - 10.5 mg/dL 10.0  Alkaline Phosphatase 39 - 117 U/L 95  Albumin 3.5 - 5.2 g/dL 4.5  AST 0 - 37 U/L 20  ALT 0 - 35 U/L 23  Total Protein 6.0 - 8.3 g/dL 7.5  Bilirubin, Direct 0.0 - 0.3 mg/dL 0.1  Total Bilirubin 0.2 - 1.2 mg/dL 0.5  GFR >60.00 mL/min 83.21  Total CHOL/HDL Ratio  4  Cholesterol 0 - 200 mg/dL 177  HDL Cholesterol >39.00 mg/dL 50.60  LDL  (calc) 0 - 99 mg/dL 108 (H)  NonHDL  126.60  Triglycerides 0.0 - 149.0 mg/dL 95.0  VLDL 0.0 - 40.0 mg/dL 19.0   Old records , labs and images have been reviewed.    ASSESSMENT /  PLAN / RECOMMENDATIONS:   1) Type 2 Diabetes Mellitus, Poorly controlled, With Neuropathic  complications - Most recent A1c of 9.9 %. Goal A1c < 7.0 %.    - Poorly controlled diabetes, pt with dietary indiscretions she also has ran out of her glycemic agents months ago - She was counseled about avoiding sugar sweetened bevrages and following low-carb diet - Declines insulin  -I will refill her metformin and pioglitazone, and able to increase metformin dose due to soft/loose stools - I will switch glimepiride to glipizide emphasized the importance of taking it before the first and last meal of the day    MEDICATIONS: Stop glimepiride Start glipizide 5 mg, 1 tablet before breakfast and 1 tablet before supper Continue metformin 500 mg XR, 2 tabs daily Restart pioglitazone 45 mg daily  EDUCATION / INSTRUCTIONS: BG monitoring instructions: Patient is instructed to check her blood sugars 1 times a day. Call [place] [facility] if: BG persistently < 70  I reviewed the Rule of 15 for the treatment of hypoglycemia in detail with the patient. Literature supplied.    2) Diabetic complications:  Eye: Does not have known diabetic retinopathy.  Neuro/ Feet: Does  have known diabetic peripheral neuropathy .  Renal: Patient does not have known baseline CKD. She   is not on an ACEI/ARB at present.    3) Dyslipidemia:  - LDL above goal at 108 mg/DL - Discussed cardiovascular benefits of statins - Discussed low fat diet  -Will continue to monitor at this time  Medication  Continue simvastatin 40 mg daily    F/U in 4 months   Signed electronically by: Mack Guise, MD  [facility]  [facility] [address] [place], [place] [address] Phone:  [phone] FAX: [phone]   CC: [name], [address] [place] [address] Phone: [phone]  Fax: [phone]  Return to Endocrinology clinic as below: Future Appointments  Date Time Provider [place]  07/28/2022 10:10 AM Brayson Livesey, Melanie Crazier, MD [facility] None

## 2022-03-25 ENCOUNTER — Other Ambulatory Visit: Payer: Self-pay | Admitting: Family Medicine

## 2022-03-25 DIAGNOSIS — I10 Essential (primary) hypertension: Secondary | ICD-10-CM

## 2022-05-10 ENCOUNTER — Other Ambulatory Visit: Payer: Self-pay | Admitting: Family Medicine

## 2022-07-28 ENCOUNTER — Ambulatory Visit: Payer: Self-pay | Admitting: Internal Medicine

## 2022-08-17 ENCOUNTER — Other Ambulatory Visit: Payer: Self-pay | Admitting: Family Medicine

## 2022-08-17 DIAGNOSIS — I10 Essential (primary) hypertension: Secondary | ICD-10-CM

## 2022-08-27 ENCOUNTER — Ambulatory Visit: Payer: Self-pay | Admitting: Family Medicine

## 2022-08-31 ENCOUNTER — Encounter: Payer: Self-pay | Admitting: Family Medicine

## 2022-08-31 ENCOUNTER — Ambulatory Visit (INDEPENDENT_AMBULATORY_CARE_PROVIDER_SITE_OTHER): Payer: Self-pay | Admitting: Family Medicine

## 2022-08-31 VITALS — BP 126/76 | HR 80 | Temp 97.8°F | Resp 17 | Ht 64.0 in | Wt 167.5 lb

## 2022-08-31 DIAGNOSIS — E1142 Type 2 diabetes mellitus with diabetic polyneuropathy: Secondary | ICD-10-CM

## 2022-08-31 DIAGNOSIS — Z7984 Long term (current) use of oral hypoglycemic drugs: Secondary | ICD-10-CM

## 2022-08-31 DIAGNOSIS — Z0000 Encounter for general adult medical examination without abnormal findings: Secondary | ICD-10-CM

## 2022-08-31 DIAGNOSIS — Z8639 Personal history of other endocrine, nutritional and metabolic disease: Secondary | ICD-10-CM

## 2022-08-31 NOTE — Assessment & Plan Note (Signed)
Pt's PE unchanged from previous.  UTD on colonoscopy, Tdap.  Due for mammo- she plans to schedule.  Check labs.  Anticipatory guidance provided.

## 2022-08-31 NOTE — Patient Instructions (Signed)
Follow up in 6 months to recheck BP and cholesterol We'll notify you of your lab results and make any changes if needed Continue to work on low carb diet and regular physical exercise Schedule your mammogram Call with any questions or concerns Stay Safe!  Stay Healthy! Have a great summer!!!

## 2022-08-31 NOTE — Progress Notes (Signed)
   Subjective:    Patient ID: Dana Payne, female    DOB: Apr 12, 1966, 57 y.o.   MRN: [id number]  HPI CPE- UTD on colonoscopy, Tdap, foot exam, eye exam.  Due for mammo and microalbumin  Patient Care Team    Relationship Specialty Notifications Start End  Sheliah Hatch, MD PCP - General   04/08/10   Corbin Ade, MD  Gastroenterology  10/01/10   Ok Edwards, MD (Inactive) Consulting Physician Gynecology  11/07/14     Health Maintenance  Topic Date Due   Diabetic kidney evaluation - Urine ACR  12/04/2010   MAMMOGRAM  05/29/2022   OPHTHALMOLOGY EXAM  07/16/2022   HEMOGLOBIN A1C  09/21/2022   INFLUENZA VACCINE  11/25/2022   Diabetic kidney evaluation - eGFR measurement  12/31/2022   FOOT EXAM  12/31/2022   DTaP/Tdap/Td (2 - Td or Tdap) 02/08/2024   COLONOSCOPY (Pts 45-41yrs Insurance coverage will need to be confirmed)  04/22/2027   Hepatitis C Screening  Completed   HIV Screening  Completed   HPV VACCINES  Aged Out   COVID-19 Vaccine  Discontinued   Zoster Vaccines- Shingrix  Discontinued      Review of Systems Patient reports no vision/ hearing changes, adenopathy,fever, weight change,  persistant/recurrent hoarseness , swallowing issues, chest pain, palpitations, edema, persistant/recurrent cough, hemoptysis, dyspnea (rest/exertional/paroxysmal nocturnal), gastrointestinal bleeding (melena, rectal bleeding), abdominal pain, significant heartburn, bowel changes, GU symptoms (dysuria, hematuria, incontinence), Gyn symptoms (abnormal  bleeding, pain),  syncope, focal weakness, memory loss, skin/hair/nail changes, abnormal bruising or bleeding, anxiety, or depression.   + known neuropathy    Objective:   Physical Exam General Appearance:    Alert, cooperative, no distress, appears stated age  Head:    Normocephalic, without obvious abnormality, atraumatic  Eyes:    PERRL, conjunctiva/corneas clear, EOM's intact both eyes  Ears:    Normal TM's and external ear  canals, both ears  Nose:   Nares normal, septum midline, mucosa normal, no drainage    or sinus tenderness  Throat:   Lips, mucosa, and tongue normal; teeth and gums normal  Neck:   Supple, symmetrical, trachea midline, no adenopathy;    Thyroid: goiter  Back:     Symmetric, no curvature, ROM normal, no CVA tenderness  Lungs:     Clear to auscultation bilaterally, respirations unlabored  Chest Wall:    No tenderness or deformity   Heart:    Regular rate and rhythm, S1 and S2 normal, no murmur, rub   or gallop  Breast Exam:    Deferred to mammo  Abdomen:     Soft, non-tender, bowel sounds active all four quadrants,    no masses, no organomegaly  Genitalia:    Deferred  Rectal:    Extremities:   Extremities normal, atraumatic, no cyanosis or edema  Pulses:   2+ and symmetric all extremities  Skin:   Skin color, texture, turgor normal, no rashes or lesions  Lymph nodes:   Cervical, supraclavicular, and axillary nodes normal  Neurologic:   CNII-XII intact, normal strength, sensation and reflexes    throughout          Assessment & Plan:

## 2022-08-31 NOTE — Assessment & Plan Note (Signed)
Pt is following w/ Endo but needs microalbumin.  Ordered

## 2022-09-01 ENCOUNTER — Telehealth: Payer: Self-pay

## 2022-09-01 NOTE — Telephone Encounter (Signed)
Dana Payne came to me this morning and let me know that duplicate lables were printed and the wrong labels were placed on this patient's blood. Dana Payne has called her and let her know that we may need to have her come in for a redraw. Since then I have spoken to Dana Payne in the lab and Dana Payne stated that we currently don't need to redraw Dana Payne as long as the new blood work they receive for the other patient matches yesterday's blood work. Dana Payne has called Dana Payne to give her this update.

## 2022-09-08 ENCOUNTER — Other Ambulatory Visit: Payer: Self-pay

## 2022-09-08 DIAGNOSIS — I10 Essential (primary) hypertension: Secondary | ICD-10-CM

## 2022-09-08 DIAGNOSIS — E785 Hyperlipidemia, unspecified: Secondary | ICD-10-CM

## 2022-09-08 DIAGNOSIS — E1142 Type 2 diabetes mellitus with diabetic polyneuropathy: Secondary | ICD-10-CM

## 2022-09-08 DIAGNOSIS — Z0000 Encounter for general adult medical examination without abnormal findings: Secondary | ICD-10-CM

## 2022-09-08 NOTE — Telephone Encounter (Signed)
Unfortunately she will need to come have her blood redrawn as they didn't run her original samples

## 2022-09-08 NOTE — Telephone Encounter (Signed)
I spoke to the pt and she states that someone has made her a lab only visit ,. I have placed the labs that need to be redrew which are A1C , Lipid ,TSH, Vit D , CBC w Diff, BMP, Liver function and a micro urine

## 2022-09-28 ENCOUNTER — Other Ambulatory Visit: Payer: Self-pay | Admitting: Family Medicine

## 2022-09-28 DIAGNOSIS — I10 Essential (primary) hypertension: Secondary | ICD-10-CM

## 2022-10-01 ENCOUNTER — Encounter: Payer: Self-pay | Admitting: Internal Medicine

## 2022-10-01 ENCOUNTER — Ambulatory Visit (INDEPENDENT_AMBULATORY_CARE_PROVIDER_SITE_OTHER): Payer: Self-pay | Admitting: Internal Medicine

## 2022-10-01 VITALS — BP 138/80 | HR 74 | Ht 64.0 in | Wt 168.2 lb

## 2022-10-01 DIAGNOSIS — E785 Hyperlipidemia, unspecified: Secondary | ICD-10-CM

## 2022-10-01 DIAGNOSIS — E1142 Type 2 diabetes mellitus with diabetic polyneuropathy: Secondary | ICD-10-CM

## 2022-10-01 DIAGNOSIS — E1165 Type 2 diabetes mellitus with hyperglycemia: Secondary | ICD-10-CM

## 2022-10-01 DIAGNOSIS — Z7984 Long term (current) use of oral hypoglycemic drugs: Secondary | ICD-10-CM

## 2022-10-01 LAB — COMPREHENSIVE METABOLIC PANEL
ALT: 20 U/L (ref 0–35)
AST: 20 U/L (ref 0–37)
Albumin: 4.7 g/dL (ref 3.5–5.2)
Alkaline Phosphatase: 68 U/L (ref 39–117)
BUN: 9 mg/dL (ref 6–23)
CO2: 30 mEq/L (ref 19–32)
Calcium: 10 mg/dL (ref 8.4–10.5)
Chloride: 103 mEq/L (ref 96–112)
Creatinine, Ser: 0.75 mg/dL (ref 0.40–1.20)
GFR: 88.1 mL/min (ref >60.00)
Glucose, Bld: 100 mg/dL — ABNORMAL HIGH (ref 70–99)
Potassium: 3.9 mEq/L (ref 3.5–5.1)
Sodium: 140 mEq/L (ref 135–145)
Total Bilirubin: 0.4 mg/dL (ref 0.2–1.2)
Total Protein: 7.5 g/dL (ref 6.0–8.3)

## 2022-10-01 LAB — CBC
HCT: 41.9 % (ref 36.0–46.0)
Hemoglobin: 13.7 g/dL (ref 12.0–15.0)
MCHC: 32.8 g/dL (ref 30.0–36.0)
MCV: 91 fl (ref 78.0–100.0)
Platelets: 262 10*3/uL (ref 150.0–400.0)
RBC: 4.6 Mil/uL (ref 3.87–5.11)
RDW: 12.9 % (ref 11.5–15.5)
WBC: 5 10*3/uL (ref 4.0–10.5)

## 2022-10-01 LAB — MICROALBUMIN / CREATININE URINE RATIO
Creatinine,U: 30.5 mg/dL
Microalb Creat Ratio: 2.3 mg/g (ref 0.0–30.0)
Microalb, Ur: <0.7 mg/dL (ref 0.0–1.9)

## 2022-10-01 LAB — LIPID PANEL
Cholesterol: 145 mg/dL (ref 0–200)
HDL: 52.4 mg/dL (ref >39.00)
LDL Cholesterol: 79 mg/dL (ref 0–99)
NonHDL: 92.82
Total CHOL/HDL Ratio: 3
Triglycerides: 70 mg/dL (ref 0.0–149.0)
VLDL: 14 mg/dL (ref 0.0–40.0)

## 2022-10-01 LAB — POCT GLYCOSYLATED HEMOGLOBIN (HGB A1C): Hemoglobin A1C: 6.5 % — AB (ref 4.0–5.6)

## 2022-10-01 LAB — TSH: TSH: 1.55 u[IU]/mL (ref 0.35–5.50)

## 2022-10-01 LAB — GLUCOSE, POCT (MANUAL RESULT ENTRY): POC Glucose: 140 mg/dl — AB (ref 70–99)

## 2022-10-01 MED ORDER — GLIPIZIDE 5 MG PO TABS: 2.5000 mg | ORAL_TABLET | Freq: Two times a day (BID) | ORAL | 3 refills | Status: DC

## 2022-10-01 NOTE — Progress Notes (Signed)
Name: Dana Payne  Age/ Sex: 57 y.o., female   MRN/ DOB: [id number], [birth year]     PCP: Sheliah Hatch, MD   Reason for Endocrinology Evaluation: Type 2 Diabetes Mellitus  Initial Endocrine Consultative Visit: 07/01/2016    PATIENT IDENTIFIER: Dana Payne is a 57 y.o. female with a past medical history of DM, HTN, and dyslipidemia. The patient has followed with Endocrinology clinic since 07/01/2016 for consultative assistance with management of her diabetes.     DIABETIC HISTORY:  Dana Payne was diagnosed with DM 2014.  She reported vomiting to [facility]. Her hemoglobin A1c has ranged from 7.0% in 2023, peaking at 11.8% in 2018.  She was followed by Dr. Everardo All from 2018 until February 2023   On her initial visit with me 02/2022 she had an A1c of 9.9%, she declined insulin, she was on metformin, glimepiride, and pioglitazone.  I switched glimepiride to glipizide  SUBJECTIVE:   During the last visit (03/23/2022): A1c 9.9%  Today (10/01/2022): Dana Payne is here for follow-up on diabetes management.  She checks her blood sugars daily . She is not having any hypoglycemic episodes     Denies nausea or vomiting  Denies constipation   She has excessive sweating and nausea with " lower Bg's " in the 80's  She was supposed to have labs last month, but she was told by the phlebotomist that her tubes were mixed up, I offered to do labs on her today   HOME DIABETES REGIMEN:  Metformin 500 mg XR 2 tabs daily  Glipizide 5 mg twice daily Pioglitazone 45 mg daily     Statin: Yes ACE-I/ARB: Yes    METER DOWNLOAD SUMMARY: Did not bring     DIABETIC COMPLICATIONS: Microvascular complications:  neuropathy Denies: CKD Last Eye Exam: Completed 06/2021  Macrovascular complications:   Denies: CAD, CVA, PVD   HISTORY:  Past Medical History:  Past Medical History:  Diagnosis Date   Diabetes mellitus    GERD (gastroesophageal reflux disease)    HTN (hypertension)     Hypercholesteremia    Hypercholesterolemia    Lichen sclerosus of vulva 2023   Migraines    Type II diabetes mellitus ([facility])    Past Surgical History:  Past Surgical History:  Procedure Laterality Date   ABDOMINAL HYSTERECTOMY  2008   Leiomyoma/menorrhagia   COLONOSCOPY  2012   COLONOSCOPY N/A 04/21/2017   Procedure: COLONOSCOPY;  Surgeon: Corbin Ade, MD;  [facility];  Service: Endoscopy;  Laterality: N/A;  2:15pm   LASER ABLATION OF THE CERVIX     dysplasia   TOTAL ABDOMINAL HYSTERECTOMY  2008   Social History:  reports that she has never smoked. She has never used smokeless tobacco. She reports that she does not drink alcohol and does not use drugs. Family History:  Family History  Problem Relation Age of Onset   Diabetes Mother    Dementia Mother    Dementia Father    Heart disease Father    Diabetes Father    Diabetes Sister    Diabetes Brother    Heart disease Brother    Colon polyps Maternal Aunt    Colon cancer Maternal Aunt        in 106s when diagnosed.    Cancer Maternal Aunt        Pancreatic   Colon polyps Paternal Uncle      HOME MEDICATIONS: Allergies as of 10/01/2022   No Known Allergies      Medication  List        Accurate as of October 01, 2022  3:40 PM. If you have any questions, ask your nurse or doctor.          aspirin EC 81 MG tablet Take 81 mg by mouth daily.   betamethasone valerate ointment 0.1 % Commonly known as: VALISONE Apply 1 application topically 2 (two) times daily. Use twice daily for a flare.  Use twice a week at bedtime for maintenance dosing.   Contour Next Test test strip Generic drug: glucose blood 1 each by Other route daily. And lancets 1/day   glipiZIDE 5 MG tablet Commonly known as: GLUCOTROL Take 0.5 tablets (2.5 mg total) by mouth 2 (two) times daily before a meal. What changed: how much to take Changed by: Scarlette Shorts, MD   lisinopril-hydrochlorothiazide 20-12.5 MG  tablet Commonly known as: ZESTORETIC Take 1 tablet by mouth once daily   metFORMIN 500 MG 24 hr tablet Commonly known as: GLUCOPHAGE-XR TAKE 2 TABLETS BY MOUTH IN THE MORNING   pioglitazone 45 MG tablet Commonly known as: ACTOS Take 1 tablet (45 mg total) by mouth daily.   Polyethyl Glycol-Propyl Glycol 0.4-0.3 % Soln Place 1-2 drops into both eyes 3 (three) times daily as needed (for dry eyes.).   simvastatin 40 MG tablet Commonly known as: ZOCOR TAKE 1 TABLET BY MOUTH AT BEDTIME         OBJECTIVE:   Vital Signs: BP 138/80   Pulse 74   Ht 5\' 4"  (1.626 m)   Wt 168 lb 3.2 oz (76.3 kg)   SpO2 96%   BMI 28.87 kg/m   Wt Readings from Last 3 Encounters:  10/01/22 168 lb 3.2 oz (76.3 kg)  08/31/22 167 lb 8 oz (76 kg)  03/23/22 165 lb 6.4 oz (75 kg)     Exam: General: Pt appears well and is in NAD  Lungs: Clear with good BS bilat   Heart: RRR   Abdomen:  soft, nontender  Extremities: No pretibial edema.   Neuro: MS is good with appropriate affect, pt is alert and Ox3     Dm Foot Exam 10/01/2022  The skin of the feet is intact without sores or ulcerations. The pedal pulses are 2+ on right and 2+ on left. The sensation is intact to a screening 5.07, 10 gram monofilament bilaterally     DATA REVIEWED:  Lab Results  Component Value Date   HGBA1C 6.5 (A) 10/01/2022   HGBA1C 12.7 (A) 03/23/2022   HGBA1C 9.9 (H) 12/30/2021     Latest Reference Range & Units 10/01/22 08:21  COMPREHENSIVE METABOLIC PANEL  Rpt !  Sodium 135 - 145 mEq/L 140  Potassium 3.5 - 5.1 mEq/L 3.9  Chloride 96 - 112 mEq/L 103  CO2 19 - 32 mEq/L 30  Glucose 70 - 99 mg/dL 161 (H)  BUN 6 - 23 mg/dL 9  Creatinine 0.96 - 0.45 mg/dL 4.09  Calcium 8.4 - 81.1 mg/dL 91.4  Alkaline Phosphatase 39 - 117 U/L 68  Albumin 3.5 - 5.2 g/dL 4.7  AST 0 - 37 U/L 20  ALT 0 - 35 U/L 20  Total Protein 6.0 - 8.3 g/dL 7.5  Total Bilirubin 0.2 - 1.2 mg/dL 0.4  GFR >78.29 mL/min 88.10    Latest Reference  Range & Units 10/01/22 08:21  Total CHOL/HDL Ratio  3  Cholesterol 0 - 200 mg/dL 562  HDL Cholesterol >13.08 mg/dL 65.78  LDL (calc) 0 - 99 mg/dL 79  MICROALB/CREAT  RATIO 0.0 - 30.0 mg/g 2.3  NonHDL  92.82  Triglycerides 0.0 - 149.0 mg/dL 40.9  VLDL 0.0 - 81.1 mg/dL 91.4    Latest Reference Range & Units 10/01/22 08:21  WBC 4.0 - 10.5 K/uL 5.0  RBC 3.87 - 5.11 Mil/uL 4.60  Hemoglobin 12.0 - 15.0 g/dL 78.2  HCT 95.6 - 21.3 % 41.9  MCV 78.0 - 100.0 fl 91.0  MCHC 30.0 - 36.0 g/dL 08.6  RDW 57.8 - 46.9 % 12.9  Platelets 150.0 - 400.0 K/uL 262.0    Latest Reference Range & Units 10/01/22 08:21  TSH 0.35 - 5.50 uIU/mL 1.55    Latest Reference Range & Units 10/01/22 08:21  Creatinine,U mg/dL 62.9  Microalb, Ur 0.0 - 1.9 mg/dL <5.2  MICROALB/CREAT RATIO 0.0 - 30.0 mg/g 2.3    ASSESSMENT / PLAN / RECOMMENDATIONS:   1) Type 2 Diabetes Mellitus, Optimally  controlled, With Neuropathic  complications - Most recent A1c of 6.5 %. Goal A1c < 7.0 %.    -I have praised the patient improved glycemic control -I have encouraged the patient to continue with lifestyle changes -She is symptomatic with BG's in the 80s, she tends to hold her evening dose of glipizide if her BG's at the lower normal, I will decrease glipizide as below -Intolerant to higher doses of metformin due to loose stools -CMP, CBC, MA/CR ratio and TSH have all come back normal    MEDICATIONS: Decrease glipizide 5 mg, half tablet before breakfast and half tablet before supper Continue metformin 500 mg XR, 2 tabs daily Continue pioglitazone 45 mg daily  EDUCATION / INSTRUCTIONS: BG monitoring instructions: Patient is instructed to check her blood sugars 1 times a day. Call [place] [facility] if: BG persistently < 70  I reviewed the Rule of 15 for the treatment of hypoglycemia in detail with the patient. Literature supplied.    2) Diabetic complications:  Eye: Does not have known diabetic retinopathy.   Neuro/ Feet: Does  have known diabetic peripheral neuropathy .  Renal: Patient does not have known baseline CKD. She   is not on an ACEI/ARB at present.    3) Dyslipidemia:  -Historically LDL above goal at 108 mg/DL -Lipid panel today is optimal   Medication  Continue simvastatin 40 mg daily    F/U in 4 months   Signed electronically by: Lyndle Herrlich, MD  [facility]  [facility] [address] [place], [place] [address] Phone: [phone] FAX: [phone]   CC: Sheliah Hatch, MD [address] [place] [address] Phone: [phone]  Fax: [phone]  Return to Endocrinology clinic as below: Future Appointments  Date Time Provider Department Center  04/06/2023  9:10 AM Rozell Kettlewell, Konrad Dolores, MD [facility] None

## 2022-10-01 NOTE — Patient Instructions (Addendum)
Glipizide 5 mg, HALF a tablet before Breakfast and Half a  tablet before supper Metformin 500 mg, 2 tablets daily  Pioglitazone 45 mg daily      HOW TO TREAT LOW BLOOD SUGARS (Blood sugar LESS THAN 70 MG/DL) Please follow the RULE OF 15 for the treatment of hypoglycemia treatment (when your (blood sugars are less than 70 mg/dL)   STEP 1: Take 15 grams of carbohydrates when your blood sugar is low, which includes:  3-4 GLUCOSE TABS  OR 3-4 OZ OF JUICE OR REGULAR SODA OR ONE TUBE OF GLUCOSE GEL    STEP 2: RECHECK blood sugar in 15 MINUTES STEP 3: If your blood sugar is still low at the 15 minute recheck --> then, go back to STEP 1 and treat AGAIN with another 15 grams of carbohydrates.

## 2023-01-02 ENCOUNTER — Other Ambulatory Visit: Payer: Self-pay | Admitting: Family Medicine

## 2023-02-27 ENCOUNTER — Other Ambulatory Visit: Payer: Self-pay | Admitting: Internal Medicine

## 2023-02-27 ENCOUNTER — Other Ambulatory Visit: Payer: Self-pay | Admitting: Family Medicine

## 2023-02-27 DIAGNOSIS — I10 Essential (primary) hypertension: Secondary | ICD-10-CM

## 2023-04-01 ENCOUNTER — Other Ambulatory Visit: Payer: Self-pay | Admitting: Internal Medicine

## 2023-04-05 ENCOUNTER — Other Ambulatory Visit: Payer: Self-pay | Admitting: Family Medicine

## 2023-04-05 DIAGNOSIS — I10 Essential (primary) hypertension: Secondary | ICD-10-CM

## 2023-04-06 ENCOUNTER — Encounter: Payer: Self-pay | Admitting: Internal Medicine

## 2023-04-06 ENCOUNTER — Ambulatory Visit (INDEPENDENT_AMBULATORY_CARE_PROVIDER_SITE_OTHER): Payer: Self-pay | Admitting: Internal Medicine

## 2023-04-06 ENCOUNTER — Ambulatory Visit: Payer: Self-pay | Admitting: Family Medicine

## 2023-04-06 VITALS — BP 120/80 | HR 74 | Ht 64.0 in | Wt 170.0 lb

## 2023-04-06 DIAGNOSIS — Z7984 Long term (current) use of oral hypoglycemic drugs: Secondary | ICD-10-CM

## 2023-04-06 DIAGNOSIS — E1142 Type 2 diabetes mellitus with diabetic polyneuropathy: Secondary | ICD-10-CM

## 2023-04-06 DIAGNOSIS — M67972 Unspecified disorder of synovium and tendon, left ankle and foot: Secondary | ICD-10-CM

## 2023-04-06 DIAGNOSIS — E785 Hyperlipidemia, unspecified: Secondary | ICD-10-CM

## 2023-04-06 LAB — POCT GLYCOSYLATED HEMOGLOBIN (HGB A1C): Hemoglobin A1C: 6.7 % — AB (ref 4.0–5.6)

## 2023-04-06 LAB — POCT GLUCOSE (DEVICE FOR HOME USE): POC Glucose: 137 mg/dL — AB (ref 70–99)

## 2023-04-06 MED ORDER — METFORMIN HCL ER 500 MG PO TB24: ORAL_TABLET | ORAL | 0 refills | Status: DC

## 2023-04-06 MED ORDER — PIOGLITAZONE HCL 45 MG PO TABS: 45.0000 mg | ORAL_TABLET | Freq: Every day | ORAL | 1 refills | Status: DC

## 2023-04-06 MED ORDER — GLIPIZIDE 5 MG PO TABS: 2.5000 mg | ORAL_TABLET | Freq: Every day | ORAL | 3 refills | Status: DC

## 2023-04-06 NOTE — Patient Instructions (Signed)
Glipizide 5 mg, Half a  tablet before supper Metformin 500 mg, 2 tablets daily  Pioglitazone 45 mg daily      HOW TO TREAT LOW BLOOD SUGARS (Blood sugar LESS THAN 70 MG/DL) Please follow the RULE OF 15 for the treatment of hypoglycemia treatment (when your (blood sugars are less than 70 mg/dL)   STEP 1: Take 15 grams of carbohydrates when your blood sugar is low, which includes:  3-4 GLUCOSE TABS  OR 3-4 OZ OF JUICE OR REGULAR SODA OR ONE TUBE OF GLUCOSE GEL    STEP 2: RECHECK blood sugar in 15 MINUTES STEP 3: If your blood sugar is still low at the 15 minute recheck --> then, go back to STEP 1 and treat AGAIN with another 15 grams of carbohydrates.

## 2023-04-06 NOTE — Progress Notes (Signed)
Name: Dana Payne  Age/ Sex: 57 y.o., female   MRN/ DOB: [id number], 17-Mar-1966     PCP: Sheliah Hatch, MD   Reason for Endocrinology Evaluation: Type 2 Diabetes Mellitus  Initial Endocrine Consultative Visit: 07/01/2016    PATIENT IDENTIFIER: Dana Payne is a 57 y.o. female with a past medical history of DM, HTN, and dyslipidemia. The patient has followed with Endocrinology clinic since 07/01/2016 for consultative assistance with management of her diabetes.     DIABETIC HISTORY:  [name] was diagnosed with DM 2014.  She reported vomiting to [place]. Her hemoglobin A1c has ranged from 7.0% in 2023, peaking at 11.8% in 2018.  She was followed by Dr. Everardo All from 2018 until February 2023   On her initial visit with me 02/2022 she had an A1c of 9.9%, she declined insulin, she was on metformin, glimepiride, and pioglitazone.  I switched glimepiride to glipizide  SUBJECTIVE:   During the last visit (10/01/2022): A1c 6.5%  Today (04/06/2023): Ms. [name] is here for follow-up on diabetes management.  She checks her blood sugars daily . She is  having any hypoglycemic episodes, she is symptotic with these episodes .     Denies nausea or vomiting  Denies constipation  but has noted loose stools   Has left foot pain at the achilles tendon insertion   HOME DIABETES REGIMEN:  Metformin 500 mg XR 2 tabs daily  Glipizide 5 mg, half a tablet  twice daily Pioglitazone 45 mg daily     Statin: Yes ACE-I/ARB: Yes    METER DOWNLOAD SUMMARY: Did not bring     DIABETIC COMPLICATIONS: Microvascular complications:  neuropathy Denies: CKD Last Eye Exam: Completed 06/2021  Macrovascular complications:   Denies: CAD, CVA, PVD   HISTORY:  Past Medical History:  Past Medical History:  Diagnosis Date   Diabetes mellitus    GERD (gastroesophageal reflux disease)    HTN (hypertension)    Hypercholesteremia    Hypercholesterolemia    Lichen sclerosus of vulva 2023    Migraines    Type II diabetes mellitus (HCC)    Past Surgical History:  Past Surgical History:  Procedure Laterality Date   ABDOMINAL HYSTERECTOMY  2008   Leiomyoma/menorrhagia   COLONOSCOPY  2012   COLONOSCOPY N/A 04/21/2017   Procedure: COLONOSCOPY;  Surgeon: Corbin Ade, MD;  Location: [facility];  Service: Endoscopy;  Laterality: N/A;  2:15pm   LASER ABLATION OF THE CERVIX     dysplasia   TOTAL ABDOMINAL HYSTERECTOMY  2008   Social History:  reports that she has never smoked. She has never used smokeless tobacco. She reports that she does not drink alcohol and does not use drugs. Family History:  Family History  Problem Relation Age of Onset   Diabetes Mother    Dementia Mother    Dementia Father    Heart disease Father    Diabetes Father    Diabetes Sister    Diabetes Brother    Heart disease Brother    Colon polyps Maternal Aunt    Colon cancer Maternal Aunt        in 18s when diagnosed.    Cancer Maternal Aunt        Pancreatic   Colon polyps Paternal Uncle      HOME MEDICATIONS: Allergies as of 04/06/2023   No Known Allergies      Medication List        Accurate as of April 06, 2023  9:19 AM. If  you have any questions, ask your nurse or doctor.          aspirin EC 81 MG tablet Take 81 mg by mouth daily.   betamethasone valerate ointment 0.1 % Commonly known as: VALISONE Apply 1 application topically 2 (two) times daily. Use twice daily for a flare.  Use twice a week at bedtime for maintenance dosing.   Contour Next Test test strip Generic drug: glucose blood 1 each by Other route daily. And lancets 1/day   glipiZIDE 5 MG tablet Commonly known as: GLUCOTROL Take 0.5 tablets (2.5 mg total) by mouth 2 (two) times daily before a meal.   lisinopril-hydrochlorothiazide 20-12.5 MG tablet Commonly known as: ZESTORETIC Take 1 tablet by mouth once daily   metFORMIN 500 MG 24 hr tablet Commonly known as: GLUCOPHAGE-XR TAKE 2 TABLETS  BY MOUTH IN THE MORNING   pioglitazone 45 MG tablet Commonly known as: ACTOS Take 1 tablet by mouth once daily   Polyethyl Glycol-Propyl Glycol 0.4-0.3 % Soln Place 1-2 drops into both eyes 3 (three) times daily as needed (for dry eyes.).   simvastatin 40 MG tablet Commonly known as: ZOCOR TAKE 1 TABLET BY MOUTH AT BEDTIME         OBJECTIVE:   Vital Signs: BP 120/80 (BP Location: Right Arm, Patient Position: Sitting, Cuff Size: Small)   Pulse 74   Ht 5\' 4"  (1.626 m)   Wt 170 lb (77.1 kg)   SpO2 98%   BMI 29.18 kg/m   Wt Readings from Last 3 Encounters:  04/06/23 170 lb (77.1 kg)  10/01/22 168 lb 3.2 oz (76.3 kg)  08/31/22 167 lb 8 oz (76 kg)     Exam: General: Pt appears well and is in NAD  Lungs: Clear with good BS bilat   Heart: RRR   Extremities: Mild tenderness at the left achilles tendon  Neuro: MS is good with appropriate affect, pt is alert and Ox3     Dm Foot Exam 10/01/2022  The skin of the feet is intact without sores or ulcerations. The pedal pulses are 2+ on right and 2+ on left. The sensation is intact to a screening 5.07, 10 gram monofilament bilaterally     DATA REVIEWED:  Lab Results  Component Value Date   HGBA1C 6.5 (A) 10/01/2022   HGBA1C 12.7 (A) 03/23/2022   HGBA1C 9.9 (H) 12/30/2021     Latest Reference Range & Units 10/01/22 08:21  COMPREHENSIVE METABOLIC PANEL  Rpt !  Sodium 135 - 145 mEq/L 140  Potassium 3.5 - 5.1 mEq/L 3.9  Chloride 96 - 112 mEq/L 103  CO2 19 - 32 mEq/L 30  Glucose 70 - 99 mg/dL 914 (H)  BUN 6 - 23 mg/dL 9  Creatinine 7.82 - 9.56 mg/dL 2.13  Calcium 8.4 - 08.6 mg/dL 57.8  Alkaline Phosphatase 39 - 117 U/L 68  Albumin 3.5 - 5.2 g/dL 4.7  AST 0 - 37 U/L 20  ALT 0 - 35 U/L 20  Total Protein 6.0 - 8.3 g/dL 7.5  Total Bilirubin 0.2 - 1.2 mg/dL 0.4  GFR >46.96 mL/min 88.10    Latest Reference Range & Units 10/01/22 08:21  Total CHOL/HDL Ratio  3  Cholesterol 0 - 200 mg/dL 295  HDL Cholesterol >28.41  mg/dL 32.44  LDL (calc) 0 - 99 mg/dL 79  MICROALB/CREAT RATIO 0.0 - 30.0 mg/g 2.3  NonHDL  92.82  Triglycerides 0.0 - 149.0 mg/dL 01.0  VLDL 0.0 - 27.2 mg/dL 53.6    Latest  Reference Range & Units 10/01/22 08:21  WBC 4.0 - 10.5 K/uL 5.0  RBC 3.87 - 5.11 Mil/uL 4.60  Hemoglobin 12.0 - 15.0 g/dL 16.1  HCT 09.6 - 04.5 % 41.9  MCV 78.0 - 100.0 fl 91.0  MCHC 30.0 - 36.0 g/dL 40.9  RDW 81.1 - 91.4 % 12.9  Platelets 150.0 - 400.0 K/uL 262.0    Latest Reference Range & Units 10/01/22 08:21  TSH 0.35 - 5.50 uIU/mL 1.55    Latest Reference Range & Units 10/01/22 08:21  Creatinine,U mg/dL 78.2  Microalb, Ur 0.0 - 1.9 mg/dL <9.5  MICROALB/CREAT RATIO 0.0 - 30.0 mg/g 2.3    ASSESSMENT / PLAN / RECOMMENDATIONS:   1) Type 2 Diabetes Mellitus, Optimally  controlled, With Neuropathic  complications - Most recent A1c of 6.7 %. Goal A1c < 7.0 %.    - A1c at goal  -Intolerant to higher doses of metformin due to loose stools -Patient endorses hypoglycemia in the middle of the day, will discontinue glipizide in the morning but continue to take half a tablet before supper    MEDICATIONS: Decrease glipizide 5 mg,  half tablet before supper Continue metformin 500 mg XR, 2 tabs daily Continue pioglitazone 45 mg daily  EDUCATION / INSTRUCTIONS: BG monitoring instructions: Patient is instructed to check her blood sugars 1 times a day. Call  Endocrinology clinic if: BG persistently < 70  I reviewed the Rule of 15 for the treatment of hypoglycemia in detail with the patient. Literature supplied.    2) Diabetic complications:  Eye: Does not have known diabetic retinopathy.  Neuro/ Feet: Does  have known diabetic peripheral neuropathy .  Renal: Patient does not have known baseline CKD. She   is not on an ACEI/ARB at present.    3) Dyslipidemia:  -Historically LDL above goal at 108 mg/DL -Lipid panel today is optimal   Medication  Continue simvastatin 40 mg daily  4) Left  Achilles tendonitis :  - Discussed stretching exercise, Tylenol.  - If not improved will refer to Ortho   F/U in 6 months   Signed electronically by: Lyndle Herrlich, MD  [facility] Endocrinology  [facility] [address] [place], [place] [address] Phone: [phone] FAX: [phone]   CC: Sheliah Hatch, MD [address] [place] [address] Phone: [phone]  Fax: [phone]  Return to Endocrinology clinic as below: Future Appointments  Date Time Provider Department Center  05/06/2023  9:20 AM Sheliah Hatch, MD [id number]

## 2023-05-06 ENCOUNTER — Encounter: Payer: Self-pay | Admitting: Family Medicine

## 2023-05-06 ENCOUNTER — Ambulatory Visit (INDEPENDENT_AMBULATORY_CARE_PROVIDER_SITE_OTHER): Payer: Self-pay | Admitting: Family Medicine

## 2023-05-06 VITALS — BP 120/60 | HR 77 | Temp 97.8°F | Ht 64.0 in | Wt 173.5 lb

## 2023-05-06 DIAGNOSIS — M7660 Achilles tendinitis, unspecified leg: Secondary | ICD-10-CM

## 2023-05-06 DIAGNOSIS — I10 Essential (primary) hypertension: Secondary | ICD-10-CM

## 2023-05-06 DIAGNOSIS — E785 Hyperlipidemia, unspecified: Secondary | ICD-10-CM

## 2023-05-06 LAB — HEPATIC FUNCTION PANEL
ALT: 14 U/L (ref 0–35)
AST: 15 U/L (ref 0–37)
Albumin: 4.4 g/dL (ref 3.5–5.2)
Alkaline Phosphatase: 70 U/L (ref 39–117)
Bilirubin, Direct: 0.1 mg/dL (ref 0.0–0.3)
Total Bilirubin: 0.4 mg/dL (ref 0.2–1.2)
Total Protein: 6.9 g/dL (ref 6.0–8.3)

## 2023-05-06 LAB — LIPID PANEL
Cholesterol: 150 mg/dL (ref 0–200)
HDL: 49.4 mg/dL (ref >39.00)
LDL Cholesterol: 86 mg/dL (ref 0–99)
NonHDL: 100.13
Total CHOL/HDL Ratio: 3
Triglycerides: 69 mg/dL (ref 0.0–149.0)
VLDL: 13.8 mg/dL (ref 0.0–40.0)

## 2023-05-06 LAB — CBC WITH DIFFERENTIAL/PLATELET
Basophils Absolute: 0 10*3/uL (ref 0.0–0.1)
Basophils Relative: 0.3 % (ref 0.0–3.0)
Eosinophils Absolute: 0.1 10*3/uL (ref 0.0–0.7)
Eosinophils Relative: 2.2 % (ref 0.0–5.0)
HCT: 41 % (ref 36.0–46.0)
Hemoglobin: 13.5 g/dL (ref 12.0–15.0)
Lymphocytes Relative: 25.7 % (ref 12.0–46.0)
Lymphs Abs: 1.5 10*3/uL (ref 0.7–4.0)
MCHC: 32.9 g/dL (ref 30.0–36.0)
MCV: 91.2 fL (ref 78.0–100.0)
Monocytes Absolute: 0.4 10*3/uL (ref 0.1–1.0)
Monocytes Relative: 7.8 % (ref 3.0–12.0)
Neutro Abs: 3.7 10*3/uL (ref 1.4–7.7)
Neutrophils Relative %: 64 % (ref 43.0–77.0)
Platelets: 264 10*3/uL (ref 150.0–400.0)
RBC: 4.5 Mil/uL (ref 3.87–5.11)
RDW: 13.3 % (ref 11.5–15.5)
WBC: 5.8 10*3/uL (ref 4.0–10.5)

## 2023-05-06 LAB — BASIC METABOLIC PANEL
BUN: 9 mg/dL (ref 6–23)
CO2: 31 meq/L (ref 19–32)
Calcium: 9.6 mg/dL (ref 8.4–10.5)
Chloride: 102 meq/L (ref 96–112)
Creatinine, Ser: 0.64 mg/dL (ref 0.40–1.20)
GFR: 97.38 mL/min (ref >60.00)
Glucose, Bld: 108 mg/dL — ABNORMAL HIGH (ref 70–99)
Potassium: 3.8 meq/L (ref 3.5–5.1)
Sodium: 141 meq/L (ref 135–145)

## 2023-05-06 LAB — TSH: TSH: 1.36 u[IU]/mL (ref 0.35–5.50)

## 2023-05-06 MED ORDER — DICLOFENAC SODIUM 1 % EX GEL: 2.0000 g | Freq: Four times a day (QID) | CUTANEOUS | 3 refills | Status: DC

## 2023-05-06 NOTE — Patient Instructions (Signed)
 Schedule your complete physical in 6 months We'll notify you of your lab results and make any changes if needed Apply the Voltaren  (Diclofenac ) gel on the tendon 3-4x/day If the pain continues, let me know Call with any questions or concerns Stay Safe!  Stay Healthy! Happy New Year!!!

## 2023-05-06 NOTE — Progress Notes (Signed)
   Subjective:    Patient ID: Dana Payne, female    DOB: [birth year], 58 y.o.   MRN: [id number]  HPI HTN- chronic problem, on Lisinopril  hydrochlorothiazide  20/12.5mg  daily.  No CP, SOB, HA's, visual changes, edema.  Hyperlipidemia- chronic problem, on Simvastatin  40mg  daily.  No abd pain, N/V.  Heel pain- L sided.  Pt reports pain is intermittent.  Pain is located over Achilles.  Pain is worse after prolonged sitting.  Pain is better in slip on shoes, worse w/ anything putting pressure over the area.     Review of Systems For ROS see HPI     Objective:   Physical Exam Vitals reviewed.  Constitutional:      General: She is not in acute distress.    Appearance: Normal appearance. She is well-developed. She is not ill-appearing.  HENT:     Head: Normocephalic and atraumatic.  Eyes:     Conjunctiva/sclera: Conjunctivae normal.     Pupils: Pupils are equal, round, and reactive to light.  Neck:     Thyroid : No thyromegaly.  Cardiovascular:     Rate and Rhythm: Normal rate and regular rhythm.     Pulses: Normal pulses.     Heart sounds: Normal heart sounds. No murmur heard. Pulmonary:     Effort: Pulmonary effort is normal. No respiratory distress.     Breath sounds: Normal breath sounds.  Abdominal:     General: There is no distension.     Palpations: Abdomen is soft.     Tenderness: There is no abdominal tenderness.  Musculoskeletal:        General: Tenderness (over L Achille's) present.     Cervical back: Normal range of motion and neck supple.     Right lower leg: No edema.     Left lower leg: No edema.  Lymphadenopathy:     Cervical: No cervical adenopathy.  Skin:    General: Skin is warm and dry.  Neurological:     Mental Status: She is alert and oriented to person, place, and time.  Psychiatric:        Behavior: Behavior normal.           Assessment & Plan:  Achilles Tendon pain- new.  Pt reports she has pain when touched or compressed by footwear.  Will  start topical Voltaren  gel and ice.  If no improvement, may need sports med referral or we could try nitroglycerin patches locally.  Pt expressed understanding and is in agreement w/ plan.

## 2023-05-07 NOTE — Assessment & Plan Note (Signed)
 Chronic problem.  Currently on Lisinopril hydrochlorothiazide 20/12.5mg  daily w/ good control.  Currently asymptomatic.  Check labs due to ACE and diuretic use but no anticipated med changes.  Will follow.

## 2023-05-07 NOTE — Assessment & Plan Note (Signed)
Chronic problem.  On Simvastatin '40mg'$  daily w/o difficulty.  Check labs.  Adjust meds prn

## 2023-05-09 ENCOUNTER — Telehealth: Payer: Self-pay

## 2023-05-09 NOTE — Telephone Encounter (Signed)
-----   Message from Neena Rhymes sent at 05/09/2023  7:45 AM EST ----- Labs look great!  No changes at this time

## 2023-05-10 NOTE — Telephone Encounter (Signed)
 Left vm to call about labs

## 2023-05-10 NOTE — Telephone Encounter (Signed)
 Left vm  Letter out

## 2023-05-30 ENCOUNTER — Other Ambulatory Visit: Payer: Self-pay | Admitting: Family Medicine

## 2023-07-09 ENCOUNTER — Other Ambulatory Visit: Payer: Self-pay | Admitting: Family Medicine

## 2023-07-09 DIAGNOSIS — I10 Essential (primary) hypertension: Secondary | ICD-10-CM

## 2023-10-05 ENCOUNTER — Ambulatory Visit: Payer: Self-pay | Admitting: Internal Medicine

## 2023-10-05 ENCOUNTER — Other Ambulatory Visit: Payer: Self-pay | Admitting: Family Medicine

## 2023-10-05 ENCOUNTER — Ambulatory Visit: Payer: Self-pay

## 2023-10-05 NOTE — Telephone Encounter (Signed)
 FYI Only or Action Required?: FYI only for provider  Patient was last seen in primary care on 05/06/2023 by Jess Morita, MD. Called Nurse Triage reporting No chief complaint on file.. Symptoms began today. Interventions attempted: Nothing. Symptoms are: stable.  Triage Disposition: Call PCP Now  Patient/caregiver understands and will follow disposition?: Yes                             Copied from CRM [id number]. Topic: Clinical - Red Word Triage >> Oct 05, 2023  4:44 PM Baldo Levan wrote: Red Word that prompted transfer to Nurse Triage: Patient called in stating that she woke up this morning and her tongue was bleeding, and had a blood clot. Patient is a diabetic and is concerned about her medication reacting with the sun. Reason for Disposition  Nursing judgment or information in reference  Answer Assessment - Initial Assessment Questions 1. SYMPTOM: What's the main symptom you're concerned about? (e.g., chapped lips, dry mouth, lump, sores)     Patient states she woke up this morning with a pinto bean sized blood clot on left side of jaw  2. ONSET: When did the symptoms start?     This morning  3. PAIN: Is there any pain? If Yes, ask: How bad is it? (Scale: 1-10; mild, moderate, severe)   - MILD (1-3):  doesn't interfere with eating or normal activities   - MODERATE (4-7): interferes with eating some solids and normal activities   - SEVERE (8-10):  excruciating pain, interferes with most normal activities   - SEVERE DYSPHAGIA: can't swallow liquids, drooling     Denies pain, denies difficulty swallowing 4. CAUSE: What do you think is causing the symptoms?     Unsure 5. OTHER SYMPTOMS: Do you have any other symptoms? (e.g., fever, sore throat, toothache, swelling)     Denies toothache, denies sore throat, denies fever, denies tongue/airway swelling States blood clot resolved, denies bleeding at this time Reports fatigue from working outside  in the sun Persistent cough for last month, believes this is due to lisinopril   Protocols used: Mouth Symptoms-A-AH, No Guideline Available-A-AH

## 2023-10-06 ENCOUNTER — Ambulatory Visit: Payer: Self-pay | Admitting: Student in an Organized Health Care Education/Training Program

## 2023-11-03 ENCOUNTER — Ambulatory Visit: Payer: Self-pay | Admitting: Internal Medicine

## 2023-11-04 ENCOUNTER — Ambulatory Visit: Payer: Self-pay | Admitting: Family Medicine

## 2023-11-07 ENCOUNTER — Other Ambulatory Visit: Payer: Self-pay | Admitting: Internal Medicine

## 2023-11-25 ENCOUNTER — Ambulatory Visit: Payer: Self-pay | Admitting: Internal Medicine

## 2023-12-08 ENCOUNTER — Ambulatory Visit: Payer: Self-pay | Admitting: Internal Medicine

## 2023-12-16 ENCOUNTER — Encounter: Payer: Self-pay | Admitting: Family Medicine

## 2023-12-16 ENCOUNTER — Ambulatory Visit (INDEPENDENT_AMBULATORY_CARE_PROVIDER_SITE_OTHER): Payer: Self-pay | Admitting: Family Medicine

## 2023-12-16 VITALS — BP 110/82 | HR 65 | Temp 98.0°F | Wt 166.2 lb

## 2023-12-16 DIAGNOSIS — E1142 Type 2 diabetes mellitus with diabetic polyneuropathy: Secondary | ICD-10-CM

## 2023-12-16 DIAGNOSIS — E785 Hyperlipidemia, unspecified: Secondary | ICD-10-CM

## 2023-12-16 DIAGNOSIS — Z7984 Long term (current) use of oral hypoglycemic drugs: Secondary | ICD-10-CM

## 2023-12-16 DIAGNOSIS — I10 Essential (primary) hypertension: Secondary | ICD-10-CM

## 2023-12-16 LAB — CBC WITH DIFFERENTIAL/PLATELET
Basophils Absolute: 0 K/uL (ref 0.0–0.1)
Basophils Relative: 0.4 % (ref 0.0–3.0)
Eosinophils Absolute: 0.1 K/uL (ref 0.0–0.7)
Eosinophils Relative: 1.5 % (ref 0.0–5.0)
HCT: 42 % (ref 36.0–46.0)
Hemoglobin: 14.1 g/dL (ref 12.0–15.0)
Lymphocytes Relative: 29.5 % (ref 12.0–46.0)
Lymphs Abs: 1.6 K/uL (ref 0.7–4.0)
MCHC: 33.5 g/dL (ref 30.0–36.0)
MCV: 87 fl (ref 78.0–100.0)
Monocytes Absolute: 0.4 K/uL (ref 0.1–1.0)
Monocytes Relative: 7.3 % (ref 3.0–12.0)
Neutro Abs: 3.3 K/uL (ref 1.4–7.7)
Neutrophils Relative %: 61.3 % (ref 43.0–77.0)
Platelets: 245 K/uL (ref 150.0–400.0)
RBC: 4.82 Mil/uL (ref 3.87–5.11)
RDW: 12.4 % (ref 11.5–15.5)
WBC: 5.5 K/uL (ref 4.0–10.5)

## 2023-12-16 LAB — BASIC METABOLIC PANEL WITH GFR
BUN: 10 mg/dL (ref 6–23)
CO2: 30 meq/L (ref 19–32)
Calcium: 9.3 mg/dL (ref 8.4–10.5)
Chloride: 102 meq/L (ref 96–112)
Creatinine, Ser: 0.66 mg/dL (ref 0.40–1.20)
GFR: 96.25 mL/min (ref >60.00)
Glucose, Bld: 194 mg/dL — ABNORMAL HIGH (ref 70–99)
Potassium: 4.1 meq/L (ref 3.5–5.1)
Sodium: 141 meq/L (ref 135–145)

## 2023-12-16 LAB — HEPATIC FUNCTION PANEL
ALT: 14 U/L (ref 0–35)
AST: 14 U/L (ref 0–37)
Albumin: 4.5 g/dL (ref 3.5–5.2)
Alkaline Phosphatase: 93 U/L (ref 39–117)
Bilirubin, Direct: 0.1 mg/dL (ref 0.0–0.3)
Total Bilirubin: 0.5 mg/dL (ref 0.2–1.2)
Total Protein: 7 g/dL (ref 6.0–8.3)

## 2023-12-16 LAB — LIPID PANEL
Cholesterol: 150 mg/dL (ref 0–200)
HDL: 49.3 mg/dL (ref >39.00)
LDL Cholesterol: 79 mg/dL (ref 0–99)
NonHDL: 100.64
Total CHOL/HDL Ratio: 3
Triglycerides: 109 mg/dL (ref 0.0–149.0)
VLDL: 21.8 mg/dL (ref 0.0–40.0)

## 2023-12-16 LAB — MICROALBUMIN / CREATININE URINE RATIO
Creatinine,U: 150.9 mg/dL
Microalb Creat Ratio: UNDETERMINED mg/g (ref 0.0–30.0)
Microalb, Ur: <0.7 mg/dL

## 2023-12-16 LAB — HEMOGLOBIN A1C: Hgb A1c MFr Bld: 8.7 % — ABNORMAL HIGH (ref 4.6–6.5)

## 2023-12-16 LAB — TSH: TSH: 0.84 u[IU]/mL (ref 0.35–5.50)

## 2023-12-16 MED ORDER — LOSARTAN POTASSIUM-HCTZ 100-12.5 MG PO TABS: 1.0000 | ORAL_TABLET | Freq: Every day | ORAL | 1 refills | Status: AC

## 2023-12-16 NOTE — Progress Notes (Signed)
   Subjective:    Patient ID: Dana Payne, female    DOB: 1966-02-21, 58 y.o.   MRN: [id number]  HPI Hyperlipidemia- chronic problem, on Simvastatin  40mg  daily.  Down 7 labs since last visit.  Denies abd pain, N/V.  DM- chronic problem.  Supposedly following w/ Endo but has not been seen in >1 yr.  On Actos , Metformin , Glipizide .  Due for foot exam, A1C, microalbumin.  + chronic neuropathy.  Pt reports eating habits have changed 'a lot'.  HTN- chronic problem, on Lisinopril  hydrochlorothiazide  20/12.5mg  daily.  No CP, SOB, HA's, visual changes.  + dry cough- pt thinks is this is due to Lisinopril .   Review of Systems For ROS see HPI     Objective:   Physical Exam Vitals reviewed.  Constitutional:      General: She is not in acute distress.    Appearance: Normal appearance. She is well-developed. She is not ill-appearing.  HENT:     Head: Normocephalic and atraumatic.  Eyes:     Conjunctiva/sclera: Conjunctivae normal.     Pupils: Pupils are equal, round, and reactive to light.  Neck:     Thyroid : No thyromegaly.  Cardiovascular:     Rate and Rhythm: Normal rate and regular rhythm.     Pulses: Normal pulses.     Heart sounds: Normal heart sounds. No murmur heard. Pulmonary:     Effort: Pulmonary effort is normal. No respiratory distress.     Breath sounds: Normal breath sounds.  Abdominal:     General: There is no distension.     Palpations: Abdomen is soft.     Tenderness: There is no abdominal tenderness.  Musculoskeletal:     Cervical back: Normal range of motion and neck supple.     Right lower leg: No edema.     Left lower leg: No edema.  Lymphadenopathy:     Cervical: No cervical adenopathy.  Skin:    General: Skin is warm and dry.  Neurological:     General: No focal deficit present.     Mental Status: She is alert and oriented to person, place, and time.  Psychiatric:        Mood and Affect: Mood normal.        Behavior: Behavior normal.        Thought  Content: Thought content normal.           Assessment & Plan:

## 2023-12-16 NOTE — Assessment & Plan Note (Signed)
 Chronic problem.  On Lisinopril  hydrochlorothiazide  but reports ongoing dry cough.  Feels this is related to the Lisinopril .  Will switch to Losartan  hydrochlorothiazide .  Check labs due to ACE/ARB and diuretic use.  Will follow.

## 2023-12-16 NOTE — Assessment & Plan Note (Signed)
 Chronic problem.  On Simvastatin  40mg  daily w/o difficulty.  Check labs.  Adjust meds prn

## 2023-12-16 NOTE — Assessment & Plan Note (Signed)
 Chronic problem.  She is supposed to be following w/ Endo but has not been seen in >1 yr.  On Actos , Metformin , Glipizide .  Foot exam done.  A1C and microalbumin ordered.  Pt to schedule eye exam.

## 2023-12-16 NOTE — Patient Instructions (Signed)
 Schedule your complete physical in 6 months We'll notify you of your lab results and make any changes if needed Continue to work on healthy diet and regular exercise- you look great! Schedule your eye exam at your convenience- don't forget mammo and pap!!! Call with any questions or concerns Stay Safe!  Stay Healthy! Enjoy the rest of your summer!!!

## 2023-12-19 ENCOUNTER — Ambulatory Visit: Payer: Self-pay | Admitting: Family Medicine

## 2023-12-19 NOTE — Progress Notes (Signed)
 Called patient and left vm to cb in regards to labs

## 2023-12-20 NOTE — Progress Notes (Signed)
 LVM to call office or via MyChart for lab results

## 2023-12-22 NOTE — Progress Notes (Signed)
 LVM.  Letter out

## 2024-01-04 NOTE — Telephone Encounter (Signed)
 Copied from CRM [id number]. Topic: Clinical - Lab/Test Results >> Jan 04, 2024  9:23 AM Adelita E wrote: Reason for CRM: Patient called regarding lab results, relayed note that PCP attached with these results and patient stated that she does have an endocrinology appt this Friday 9/12. Patient did not have any further questions.

## 2024-01-05 NOTE — Progress Notes (Unsigned)
 Name: Dana Payne  Age/ Sex: 58 y.o., female   MRN/ DOB: [id number], [birth year]     PCP: Mahlon Comer BRAVO, MD   Reason for Endocrinology Evaluation: Type 2 Diabetes Mellitus  Initial Endocrine Consultative Visit: 07/01/2016    PATIENT IDENTIFIER: Dana Payne is a 58 y.o. female with a past medical history of DM, HTN, and dyslipidemia. The patient has followed with Endocrinology clinic since 07/01/2016 for consultative assistance with management of her diabetes.     DIABETIC HISTORY:  [name] was diagnosed with DM 2014.  She reported vomiting to [facility] . Her hemoglobin A1c has ranged from 7.0% in 2023, peaking at 11.8% in 2018.  She was followed by Dr. Kassie from 2018 until February 2023   On her initial visit with me 02/2022 she had an A1c of 9.9%, she declined insulin, she was on metformin , glimepiride , and pioglitazone .  I switched glimepiride  to glipizide   SUBJECTIVE:   During the last visit (04/06/2023): A1c 6.7%  Today (01/06/2024): Dana Payne is here for follow-up on diabetes management.  She has not been checking glucose in the past few weeks, as she is in the process of moving and has misplaced her glucose meter.  She started a new job and wakes up at 3:30 am and has been eating Breakfast tart and 3 mini No nausea or diarrhea  No constipation  She is complaining of a tender spot at the plantar surface of the foot, more of a pressure-point, patient to use cushioning Band-Aids  HOME DIABETES REGIMEN:  Metformin  500 mg XR 2 tabs daily  Glipizide  5 mg, half a tablet before supper- half a tablet before breakfast and supper  Pioglitazone  45 mg daily  Simvastatin  40 mg daily   Statin: Yes ACE-I/ARB: Yes    METER DOWNLOAD SUMMARY: Did not bring     DIABETIC COMPLICATIONS: Microvascular complications:  neuropathy Denies: CKD Last Eye Exam: Completed 06/2021  Macrovascular complications:   Denies: CAD, CVA, PVD   HISTORY:  Past Medical History:   Past Medical History:  Diagnosis Date   Diabetes mellitus    GERD (gastroesophageal reflux disease)    HTN (hypertension)    Hypercholesteremia    Hypercholesterolemia    Lichen sclerosus of vulva 2023   Migraines    Type II diabetes mellitus (HCC)    Past Surgical History:  Past Surgical History:  Procedure Laterality Date   ABDOMINAL HYSTERECTOMY  2008   Leiomyoma/menorrhagia   COLONOSCOPY  2012   COLONOSCOPY N/A 04/21/2017   Procedure: COLONOSCOPY;  Surgeon: Shaaron Lamar HERO, MD;  [facility]: [facility];  Service: Endoscopy;  Laterality: N/A;  2:15pm   LASER ABLATION OF THE CERVIX     dysplasia   TOTAL ABDOMINAL HYSTERECTOMY  2008   Social History:  reports that she has never smoked. She has never used smokeless tobacco. She reports that she does not drink alcohol and does not use drugs. Family History:  Family History  Problem Relation Age of Onset   Diabetes Mother    Dementia Mother    Dementia Father    Heart disease Father    Diabetes Father    Diabetes Sister    Diabetes Brother    Heart disease Brother    Colon polyps Maternal Aunt    Colon cancer Maternal Aunt        in 7s when diagnosed.    Cancer Maternal Aunt        Pancreatic   Colon polyps Paternal Uncle  HOME MEDICATIONS: Allergies as of 01/06/2024   No Known Allergies      Medication List        Accurate as of January 06, 2024  8:59 AM. If you have any questions, ask your nurse or doctor.          aspirin EC 81 MG tablet Take 81 mg by mouth daily.   Contour Next Test test strip Generic drug: glucose blood 1 each by Other route daily. And lancets 1/day   glipiZIDE  5 MG tablet Commonly known as: GLUCOTROL  Take 0.5 tablets (2.5 mg total) by mouth daily before supper.   losartan -hydrochlorothiazide  100-12.5 MG tablet Commonly known as: HYZAAR Take 1 tablet by mouth daily.   metFORMIN  500 MG 24 hr tablet Commonly known as: GLUCOPHAGE -XR TAKE 2 TABLETS BY MOUTH IN THE  MORNING   pioglitazone  45 MG tablet Commonly known as: ACTOS  Take 1 tablet (45 mg total) by mouth daily.   Polyethyl Glycol-Propyl Glycol 0.4-0.3 % Soln Place 1-2 drops into both eyes 3 (three) times daily as needed (for dry eyes.).   simvastatin  40 MG tablet Commonly known as: ZOCOR  TAKE 1 TABLET BY MOUTH AT BEDTIME         OBJECTIVE:   Vital Signs: BP 128/80 (BP Location: Left Arm, Patient Position: Sitting, Cuff Size: Normal)   Pulse 67   Ht 5' 4 (1.626 m)   Wt 166 lb 6.4 oz (75.5 kg)   SpO2 99%   BMI 28.56 kg/m   Wt Readings from Last 3 Encounters:  01/06/24 166 lb 6.4 oz (75.5 kg)  12/16/23 166 lb 3.2 oz (75.4 kg)  05/06/23 173 lb 8 oz (78.7 kg)     Exam: General: Pt appears well and is in NAD  Lungs: Clear with good BS bilat   Heart: RRR   Extremities: Mild tenderness at the left achilles tendon  Neuro: MS is good with appropriate affect, pt is alert and Ox3     Dm Foot Exam 12/16/2023 per PCP     DATA REVIEWED:  Lab Results  Component Value Date   HGBA1C 8.7 (H) 12/16/2023   HGBA1C 6.7 (A) 04/06/2023   HGBA1C 6.5 (A) 10/01/2022    Latest Reference Range & Units 12/16/23 08:53  Sodium 135 - 145 mEq/L 141  Potassium 3.5 - 5.1 mEq/L 4.1  Chloride 96 - 112 mEq/L 102  CO2 19 - 32 mEq/L 30  Glucose 70 - 99 mg/dL 805 (H)  BUN 6 - 23 mg/dL 10  Creatinine 9.59 - 8.79 mg/dL 9.33  Calcium 8.4 - 89.4 mg/dL 9.3  Alkaline Phosphatase 39 - 117 U/L 93  Albumin 3.5 - 5.2 g/dL 4.5  AST 0 - 37 U/L 14  ALT 0 - 35 U/L 14  Total Protein 6.0 - 8.3 g/dL 7.0  Bilirubin, Direct 0.0 - 0.3 mg/dL 0.1  Total Bilirubin 0.2 - 1.2 mg/dL 0.5  GFR >39.99 mL/min 96.25  Total CHOL/HDL Ratio  3  Cholesterol 0 - 200 mg/dL 849  HDL Cholesterol >60.99 mg/dL 50.69  LDL (calc) 0 - 99 mg/dL 79  MICROALB/CREAT RATIO 0.0 - 30.0 mg/g Unable to calculate  NonHDL  100.64  Triglycerides 0.0 - 149.0 mg/dL 890.9  VLDL 0.0 - 59.9 mg/dL 78.1    In office BG 787 MGs/DL Old records  , labs and images have been reviewed.   ASSESSMENT / PLAN / RECOMMENDATIONS:   1) Type 2 Diabetes Mellitus, poorly controlled, With Neuropathic  complications - Most recent A1c of 8.7 %.  Goal A1c < 7.0 %.    - A1c increased from 6.7% to 8.7% -Intolerant to higher doses of metformin  due to loose stools - Poorly controlled diabetes due to dietary indiscretions, discussed the importance of avoiding sugar sweetened beverages and consuming a low-carb diet, I have also encouraged the patient to check glucose daily, and if she is unable to check daily she should continue to check glucose on a regular basis at least 2-3 times a week - I will increase glipizide  as below  MEDICATIONS: Increase glipizide  5 mg, 1 tablet before breakfast and half tablet before supper Continue metformin  500 mg XR, 2 tabs daily Continue pioglitazone  45 mg daily  EDUCATION / INSTRUCTIONS: BG monitoring instructions: Patient is instructed to check her blood sugars 1 times a day. Call [facility] if: BG persistently < 70  I reviewed the Rule of 15 for the treatment of hypoglycemia in detail with the patient. Literature supplied.    2) Diabetic complications:  Eye: Does not have known diabetic retinopathy.  Neuro/ Feet: Does  have known diabetic peripheral neuropathy .  Renal: Patient does not have known baseline CKD. She   is not on an ACEI/ARB at present.    3) Dyslipidemia:  -Historically LDL above goal at 108 mg/DL -Lipid panel recently showed LDL at goal - No change  Medication  Continue simvastatin  40 mg daily    F/U in 4 months   Signed electronically by: Stefano Redgie Butts, MD  [facility] Endocrinology  [facility] [address] [place], [place] [address] Phone: [phone] FAX: [phone]   CC: Mahlon Comer BRAVO, MD [address] [place] [address] Phone: [phone]  Fax: [phone]  Return to Endocrinology clinic as  below: No future appointments.

## 2024-01-06 ENCOUNTER — Ambulatory Visit (INDEPENDENT_AMBULATORY_CARE_PROVIDER_SITE_OTHER): Payer: Self-pay | Admitting: Internal Medicine

## 2024-01-06 ENCOUNTER — Encounter: Payer: Self-pay | Admitting: Internal Medicine

## 2024-01-06 VITALS — BP 128/80 | HR 67 | Ht 64.0 in | Wt 166.4 lb

## 2024-01-06 DIAGNOSIS — E785 Hyperlipidemia, unspecified: Secondary | ICD-10-CM

## 2024-01-06 DIAGNOSIS — Z7984 Long term (current) use of oral hypoglycemic drugs: Secondary | ICD-10-CM

## 2024-01-06 DIAGNOSIS — E1165 Type 2 diabetes mellitus with hyperglycemia: Secondary | ICD-10-CM

## 2024-01-06 DIAGNOSIS — E1142 Type 2 diabetes mellitus with diabetic polyneuropathy: Secondary | ICD-10-CM

## 2024-01-06 LAB — POCT GLUCOSE (DEVICE FOR HOME USE): POC Glucose: 212 mg/dL — AB (ref 70–99)

## 2024-01-06 MED ORDER — SIMVASTATIN 40 MG PO TABS: 40.0000 mg | ORAL_TABLET | Freq: Every day | ORAL | 3 refills | Status: DC

## 2024-01-06 MED ORDER — GLIPIZIDE 5 MG PO TABS: ORAL_TABLET | ORAL | 3 refills | Status: DC

## 2024-01-06 MED ORDER — PIOGLITAZONE HCL 45 MG PO TABS: 45.0000 mg | ORAL_TABLET | Freq: Every day | ORAL | 1 refills | Status: DC

## 2024-01-06 MED ORDER — METFORMIN HCL ER 500 MG PO TB24: ORAL_TABLET | ORAL | 3 refills | Status: DC

## 2024-01-06 NOTE — Patient Instructions (Signed)
 Glipizide  5 mg, 1 tablet before breakfast and half a  tablet before supper Metformin  500 mg, 2 tablets daily  Pioglitazone  45 mg daily      HOW TO TREAT LOW BLOOD SUGARS (Blood sugar LESS THAN 70 MG/DL) Please follow the RULE OF 15 for the treatment of hypoglycemia treatment (when your (blood sugars are less than 70 mg/dL)   STEP 1: Take 15 grams of carbohydrates when your blood sugar is low, which includes:  3-4 GLUCOSE TABS  OR 3-4 OZ OF JUICE OR REGULAR SODA OR ONE TUBE OF GLUCOSE GEL    STEP 2: RECHECK blood sugar in 15 MINUTES STEP 3: If your blood sugar is still low at the 15 minute recheck --> then, go back to STEP 1 and treat AGAIN with another 15 grams of carbohydrates.

## 2024-05-11 ENCOUNTER — Ambulatory Visit (INDEPENDENT_AMBULATORY_CARE_PROVIDER_SITE_OTHER): Payer: Self-pay | Admitting: Internal Medicine

## 2024-05-11 ENCOUNTER — Encounter: Payer: Self-pay | Admitting: Internal Medicine

## 2024-05-11 VITALS — BP 128/80 | Ht 64.0 in | Wt 159.0 lb

## 2024-05-11 DIAGNOSIS — E1165 Type 2 diabetes mellitus with hyperglycemia: Secondary | ICD-10-CM

## 2024-05-11 DIAGNOSIS — E1142 Type 2 diabetes mellitus with diabetic polyneuropathy: Secondary | ICD-10-CM

## 2024-05-11 DIAGNOSIS — Z7984 Long term (current) use of oral hypoglycemic drugs: Secondary | ICD-10-CM

## 2024-05-11 DIAGNOSIS — E785 Hyperlipidemia, unspecified: Secondary | ICD-10-CM

## 2024-05-11 LAB — POCT GLYCOSYLATED HEMOGLOBIN (HGB A1C): Hemoglobin A1C: 8.9 % — AB (ref 4.0–5.6)

## 2024-05-11 MED ORDER — SIMVASTATIN 40 MG PO TABS: 40.0000 mg | ORAL_TABLET | Freq: Every day | ORAL | 3 refills | Status: AC

## 2024-05-11 MED ORDER — METFORMIN HCL ER 500 MG PO TB24: ORAL_TABLET | ORAL | 3 refills | Status: AC

## 2024-05-11 MED ORDER — GLIPIZIDE 5 MG PO TABS: ORAL_TABLET | ORAL | 3 refills | Status: AC

## 2024-05-11 MED ORDER — PIOGLITAZONE HCL 45 MG PO TABS: 45.0000 mg | ORAL_TABLET | Freq: Every day | ORAL | 1 refills | Status: AC

## 2024-05-11 NOTE — Patient Instructions (Addendum)
 Take glipizide  5 mg, 1 tablet before breakfast and half a  tablet before supper Continue metformin  500 mg, 2 tablets daily  Continue pioglitazone  45 mg daily      HOW TO TREAT LOW BLOOD SUGARS (Blood sugar LESS THAN 70 MG/DL) Please follow the RULE OF 15 for the treatment of hypoglycemia treatment (when your (blood sugars are less than 70 mg/dL)   STEP 1: Take 15 grams of carbohydrates when your blood sugar is low, which includes:  3-4 GLUCOSE TABS  OR 3-4 OZ OF JUICE OR REGULAR SODA OR ONE TUBE OF GLUCOSE GEL    STEP 2: RECHECK blood sugar in 15 MINUTES STEP 3: If your blood sugar is still low at the 15 minute recheck --> then, go back to STEP 1 and treat AGAIN with another 15 grams of carbohydrates.

## 2024-05-11 NOTE — Progress Notes (Signed)
 "   Name: Dana Payne  Age/ Sex: 59 y.o., female   MRN/ DOB: [id number], 13-May-1965     PCP: Mahlon Comer BRAVO, MD   Reason for Endocrinology Evaluation: Type 2 Diabetes Mellitus  Initial Endocrine Consultative Visit: 07/01/2016    PATIENT IDENTIFIER: Ms. Kadance Mccuistion is a 59 y.o. female with a past medical history of DM, HTN, and dyslipidemia. The patient has followed with Endocrinology clinic since 07/01/2016 for consultative assistance with management of her diabetes.     DIABETIC HISTORY:  Ms. Fatzinger was diagnosed with DM 2014.  She reported vomiting to [facility] . Her hemoglobin A1c has ranged from 7.0% in 2023, peaking at 11.8% in 2018.  She was followed by Dr. Kassie from 2018 until February 2023   On her initial visit with me 02/2022 she had an A1c of 9.9%, she declined insulin, she was on metformin , glimepiride , and pioglitazone .  I switched glimepiride  to glipizide     SUBJECTIVE:   During the last visit (01/06/2024): A1c 8.7%     Today (05/11/2024): Ms. Colgate is here for follow-up on diabetes management.  She has not been checking glucose  at home.   She started a new job and wakes up at 3:30 am  No nausea  No constipation or diarrhea   No nausea or diarrhea  No constipation  She is complaining of a tender spot at the plantar surface of the foot, more of a pressure-point, patient to use cushioning Band-Aids  HOME DIABETES REGIMEN:  Metformin  500 mg XR 2 tabs daily  Glipizide  5 mg, 1 tablet before breakfast Pioglitazone  45 mg daily  Simvastatin  40 mg daily   Statin: Yes ACE-I/ARB: Yes    METER DOWNLOAD SUMMARY: Did not bring     DIABETIC COMPLICATIONS: Microvascular complications:  neuropathy Denies: CKD Last Eye Exam: Completed 06/2021  Macrovascular complications:   Denies: CAD, CVA, PVD   HISTORY:  Past Medical History:  Past Medical History:  Diagnosis Date   Diabetes mellitus    GERD (gastroesophageal reflux disease)    HTN  (hypertension)    Hypercholesteremia    Hypercholesterolemia    Lichen sclerosus of vulva 2023   Migraines    Type II diabetes mellitus (HCC)    Past Surgical History:  Past Surgical History:  Procedure Laterality Date   ABDOMINAL HYSTERECTOMY  2008   Leiomyoma/menorrhagia   COLONOSCOPY  2012   COLONOSCOPY N/A 04/21/2017   Procedure: COLONOSCOPY;  Surgeon: Shaaron Lamar HERO, MD;  Location: [facility];  Service: Endoscopy;  Laterality: N/A;  2:15pm   LASER ABLATION OF THE CERVIX     dysplasia   TOTAL ABDOMINAL HYSTERECTOMY  2008   Social History:  reports that she has never smoked. She has never used smokeless tobacco. She reports that she does not drink alcohol and does not use drugs. Family History:  Family History  Problem Relation Age of Onset   Diabetes Mother    Dementia Mother    Dementia Father    Heart disease Father    Diabetes Father    Diabetes Sister    Diabetes Brother    Heart disease Brother    Colon polyps Maternal Aunt    Colon cancer Maternal Aunt        in 23s when diagnosed.    Cancer Maternal Aunt        Pancreatic   Colon polyps Paternal Uncle      HOME MEDICATIONS: Allergies as of 05/11/2024   No Known Allergies  Medication List        Accurate as of May 11, 2024  9:47 AM. If you have any questions, ask your nurse or doctor.          aspirin EC 81 MG tablet Take 81 mg by mouth daily.   Contour Next Test test strip Generic drug: glucose blood 1 each by Other route daily. And lancets 1/day   glipiZIDE  5 MG tablet Commonly known as: GLUCOTROL  Take 1 tablet (5 mg total) by mouth daily before breakfast AND 0.5 tablets (2.5 mg total) daily before supper.   losartan -hydrochlorothiazide  100-12.5 MG tablet Commonly known as: HYZAAR Take 1 tablet by mouth daily.   metFORMIN  500 MG 24 hr tablet Commonly known as: GLUCOPHAGE -XR TAKE 2 TABLETS BY MOUTH IN THE MORNING   pioglitazone  45 MG tablet Commonly known as:  ACTOS  Take 1 tablet (45 mg total) by mouth daily.   Polyethyl Glycol-Propyl Glycol 0.4-0.3 % Soln Place 1-2 drops into both eyes 3 (three) times daily as needed (for dry eyes.).   simvastatin  40 MG tablet Commonly known as: ZOCOR  Take 1 tablet (40 mg total) by mouth at bedtime.         OBJECTIVE:   Vital Signs: Ht 5' 4 (1.626 m)   BMI 28.56 kg/m   Wt Readings from Last 3 Encounters:  01/06/24 166 lb 6.4 oz (75.5 kg)  12/16/23 166 lb 3.2 oz (75.4 kg)  05/06/23 173 lb 8 oz (78.7 kg)     Exam: General: Pt appears well and is in NAD  Lungs: Clear with good BS bilat   Heart: RRR   Extremities: Mild tenderness at the left achilles tendon  Neuro: MS is good with appropriate affect, pt is alert and Ox3     Dm Foot Exam 05/11/2024  The skin of the feet is intact without sores or ulcerations. The pedal pulses are 2+ on right and 2+ on left. The sensation is intact to a screening 5.07, 10 gram monofilament bilaterally     DATA REVIEWED:  Lab Results  Component Value Date   HGBA1C 8.9 (A) 05/11/2024   HGBA1C 8.7 (H) 12/16/2023   HGBA1C 6.7 (A) 04/06/2023    Latest Reference Range & Units 12/16/23 08:53  Sodium 135 - 145 mEq/L 141  Potassium 3.5 - 5.1 mEq/L 4.1  Chloride 96 - 112 mEq/L 102  CO2 19 - 32 mEq/L 30  Glucose 70 - 99 mg/dL 805 (H)  BUN 6 - 23 mg/dL 10  Creatinine 9.59 - 8.79 mg/dL 9.33  Calcium 8.4 - 89.4 mg/dL 9.3  Alkaline Phosphatase 39 - 117 U/L 93  Albumin 3.5 - 5.2 g/dL 4.5  AST 0 - 37 U/L 14  ALT 0 - 35 U/L 14  Total Protein 6.0 - 8.3 g/dL 7.0  Bilirubin, Direct 0.0 - 0.3 mg/dL 0.1  Total Bilirubin 0.2 - 1.2 mg/dL 0.5  GFR >39.99 mL/min 96.25  Total CHOL/HDL Ratio  3  Cholesterol 0 - 200 mg/dL 849  HDL Cholesterol >60.99 mg/dL 50.69  LDL (calc) 0 - 99 mg/dL 79  MICROALB/CREAT RATIO 0.0 - 30.0 mg/g Unable to calculate  NonHDL  100.64  Triglycerides 0.0 - 149.0 mg/dL 890.9  VLDL 0.0 - 59.9 mg/dL 78.1    Latest Reference Range & Units  12/16/23 08:53  Creatinine,U mg/dL 849.0  Microalb, Ur mg/dL <9.2  MICROALB/CREAT RATIO 0.0 - 30.0 mg/g Unable to calculate      Old records , labs and images have been reviewed.   ASSESSMENT /  PLAN / RECOMMENDATIONS:   1) Type 2 Diabetes Mellitus, poorly controlled, With Neuropathic  complications - Most recent A1c of 8.9 %. Goal A1c < 7.0 %.    - Patient continues worsening glycemic control -I have encouraged the patient to check glucose at home -Intolerant to higher doses of metformin  due to loose stools - Intolerant to Rybelsus  - Patient with no health insurance, I did offer patient assistance for Trulicity/Ozempic, but the patient cannot give herself injections  - We will increase glipizide  as below - We will consider SGLT2 inhibitors in the future    MEDICATIONS: Increase glipizide  5 mg, 1 tablet before breakfast and half tablet before supper Continue metformin  500 mg XR, 2 tabs daily Continue pioglitazone  45 mg daily  EDUCATION / INSTRUCTIONS: BG monitoring instructions: Patient is instructed to check her blood sugars 1 times a day. Call [facility] if: BG persistently < 70  I reviewed the Rule of 15 for the treatment of hypoglycemia in detail with the patient. Literature supplied.    2) Diabetic complications:  Eye: Does not have known diabetic retinopathy.  Neuro/ Feet: Does  have known diabetic peripheral neuropathy .  Renal: Patient does not have known baseline CKD. She   is not on an ACEI/ARB at present.    3) Dyslipidemia:  -Historically LDL above goal at 108 mg/DL -Recent lipid panel shows optimal LDL - No change  Medication  Continue simvastatin  40 mg daily    F/U in 3 months   Signed electronically by: Stefano Redgie Butts, MD  [facility] Endocrinology  [facility] [address] [place] [place], [place] [address] Phone: [phone] FAX: [phone]   CC: Mahlon Comer BRAVO, MD [address] [place] [address] Phone: [phone]  Fax: [phone]  Return to Endocrinology clinic as below: No future appointments.     "

## 2024-08-10 ENCOUNTER — Ambulatory Visit: Payer: Self-pay | Admitting: Internal Medicine
